# Patient Record
Sex: Female | Born: 1935 | Race: White | Hispanic: No | Smoking: Never smoker
Health system: Southern US, Community
[De-identification: ages and names within clinical notes are randomized; demographics above are authoritative.]

## PROBLEM LIST (undated history)

## (undated) DIAGNOSIS — I10 Essential (primary) hypertension: Secondary | ICD-10-CM

## (undated) DIAGNOSIS — R600 Localized edema: Secondary | ICD-10-CM

## (undated) DIAGNOSIS — E785 Hyperlipidemia, unspecified: Secondary | ICD-10-CM

## (undated) HISTORY — PX: CHOLECYSTECTOMY: SHX55

## (undated) HISTORY — PX: ANKLE FRACTURE SURGERY: SHX122

---

## 1997-12-24 ENCOUNTER — Other Ambulatory Visit: Admission: RE | Admit: 1997-12-24 | Discharge: 1997-12-24 | Payer: Self-pay | Admitting: Internal Medicine

## 2001-01-19 ENCOUNTER — Ambulatory Visit (HOSPITAL_COMMUNITY): Admission: RE | Admit: 2001-01-19 | Discharge: 2001-01-19 | Payer: Self-pay | Admitting: Internal Medicine

## 2001-01-19 ENCOUNTER — Encounter: Payer: Self-pay | Admitting: Internal Medicine

## 2002-03-22 ENCOUNTER — Encounter: Payer: Self-pay | Admitting: Internal Medicine

## 2002-03-22 ENCOUNTER — Encounter: Admission: RE | Admit: 2002-03-22 | Discharge: 2002-03-22 | Payer: Self-pay | Admitting: Internal Medicine

## 2002-04-19 ENCOUNTER — Ambulatory Visit (HOSPITAL_COMMUNITY): Admission: RE | Admit: 2002-04-19 | Discharge: 2002-04-19 | Payer: Self-pay | Admitting: Obstetrics and Gynecology

## 2002-04-19 ENCOUNTER — Encounter (INDEPENDENT_AMBULATORY_CARE_PROVIDER_SITE_OTHER): Payer: Self-pay

## 2002-08-15 ENCOUNTER — Observation Stay (HOSPITAL_COMMUNITY): Admission: RE | Admit: 2002-08-15 | Discharge: 2002-08-16 | Payer: Self-pay | Admitting: Obstetrics and Gynecology

## 2002-08-15 ENCOUNTER — Encounter (INDEPENDENT_AMBULATORY_CARE_PROVIDER_SITE_OTHER): Payer: Self-pay | Admitting: *Deleted

## 2003-04-04 ENCOUNTER — Encounter: Admission: RE | Admit: 2003-04-04 | Discharge: 2003-04-04 | Payer: Self-pay | Admitting: Internal Medicine

## 2003-04-14 ENCOUNTER — Encounter: Admission: RE | Admit: 2003-04-14 | Discharge: 2003-04-14 | Payer: Self-pay | Admitting: Internal Medicine

## 2004-10-25 ENCOUNTER — Encounter: Admission: RE | Admit: 2004-10-25 | Discharge: 2004-10-25 | Payer: Self-pay | Admitting: Internal Medicine

## 2005-12-19 ENCOUNTER — Emergency Department (HOSPITAL_COMMUNITY): Admission: EM | Admit: 2005-12-19 | Discharge: 2005-12-19 | Payer: Self-pay | Admitting: Emergency Medicine

## 2006-05-29 ENCOUNTER — Encounter: Admission: RE | Admit: 2006-05-29 | Discharge: 2006-05-29 | Payer: Self-pay | Admitting: Neurosurgery

## 2006-09-22 ENCOUNTER — Encounter: Admission: RE | Admit: 2006-09-22 | Discharge: 2006-09-22 | Payer: Self-pay | Admitting: Internal Medicine

## 2007-02-16 ENCOUNTER — Inpatient Hospital Stay (HOSPITAL_COMMUNITY): Admission: EM | Admit: 2007-02-16 | Discharge: 2007-02-22 | Payer: Self-pay | Admitting: Emergency Medicine

## 2007-02-22 ENCOUNTER — Ambulatory Visit: Payer: Self-pay | Admitting: Family Medicine

## 2007-02-23 ENCOUNTER — Encounter (INDEPENDENT_AMBULATORY_CARE_PROVIDER_SITE_OTHER): Payer: Self-pay | Admitting: Family Medicine

## 2007-02-23 DIAGNOSIS — E039 Hypothyroidism, unspecified: Secondary | ICD-10-CM

## 2007-02-23 DIAGNOSIS — Z8659 Personal history of other mental and behavioral disorders: Secondary | ICD-10-CM | POA: Insufficient documentation

## 2007-02-23 DIAGNOSIS — S82899A Other fracture of unspecified lower leg, initial encounter for closed fracture: Secondary | ICD-10-CM | POA: Insufficient documentation

## 2007-02-23 DIAGNOSIS — N182 Chronic kidney disease, stage 2 (mild): Secondary | ICD-10-CM | POA: Insufficient documentation

## 2007-02-23 DIAGNOSIS — D519 Vitamin B12 deficiency anemia, unspecified: Secondary | ICD-10-CM

## 2007-02-23 DIAGNOSIS — I499 Cardiac arrhythmia, unspecified: Secondary | ICD-10-CM | POA: Insufficient documentation

## 2007-02-25 ENCOUNTER — Encounter: Payer: Self-pay | Admitting: Family Medicine

## 2007-03-01 ENCOUNTER — Ambulatory Visit: Payer: Self-pay | Admitting: Internal Medicine

## 2007-04-12 ENCOUNTER — Encounter: Payer: Self-pay | Admitting: Family Medicine

## 2007-04-12 ENCOUNTER — Ambulatory Visit: Payer: Self-pay | Admitting: Family Medicine

## 2007-04-18 ENCOUNTER — Encounter: Payer: Self-pay | Admitting: Family Medicine

## 2007-04-18 ENCOUNTER — Ambulatory Visit: Payer: Self-pay | Admitting: Family Medicine

## 2007-04-23 ENCOUNTER — Encounter: Payer: Self-pay | Admitting: Family Medicine

## 2007-04-24 ENCOUNTER — Telehealth: Payer: Self-pay | Admitting: Family Medicine

## 2010-12-07 NOTE — Discharge Summary (Signed)
NAMESAVONNA, [name]                ACCOUNT NO.:  [id number]   MEDICAL RECORD NO.:  [id number]          PATIENT TYPE:  INP   LOCATION:  4736                         FACILITY:  [facility]   PHYSICIAN:  Ollen Gross, M.D.    DATE OF BIRTH:  1923-10-16   DATE OF ADMISSION:  02/16/2007  DATE OF DISCHARGE:  02/22/2007                               DISCHARGE SUMMARY   ADMISSION DIAGNOSES:  1. Displaced bimalleolar comminuted right ankle fracture.  2. Hypertension .  3. Irregular heart beat  4. Hypothyroidism.   DISCHARGE DIAGNOSES:  1. Right bimalleolar ankle fracture dislocation status post open      reduction internal fixation.  2. Uncontrolled hypertension.  3. Postop bradycardia improved.  4. Mild azotemia with increasing creatinine improved.  5. Hypertension.  6. Irregular heart beat.  7. Hypothyroidism.   PROCEDURES:  On February 16, 2007, ORIF right ankle.  Surgeon Dr. Lequita Halt,  assistant Avel Peace PA-C, anesthesia general.   CONSULTATIONS:  [facility].   BRIEF HISTORY:  Kristine [name] is an 77 year old female who fell getting out  of bed going to the bathroom early on the morning of admission, twisted  her right ankle and had immediate onset of pain and deformity.  Presented for open reduction internal fixation.   LABORATORY DATA:  Preop CBC showed hemoglobin of 14.0, hematocrit 40.0,  white cell count 11.6, postop hemoglobin 12.7, last noted H&H 11.5 and  32.8.  Chem panel on admission:  Elevated BUN of 25, elevated creatinine  of 1.33, BUN 3.4.  Remaining chem panel within normal limits.  Serum B-  mets are followed.  BUN did go up to 28, back down to 21.  Creatinine  went up from 1.3 to 1.59 back down to 1.16.  Cardiac enzymes two sets,  first set on February 12, 2007, shows  CK normal at 88, CK MB normal at 3.2,  troponin 0.05.  Second set CK normal 83, CK MB normal at 3.2, troponin  0.07.  TSH level taken once on February 17, 2007, normal at 2.247.  X-rays,  right  ankle, intraoperative C spot films show fixation of median and  lateral malleolar fraction with reduction of tibial talor dislocation.  Chest x-ray February 16, 2007, basilar atelectasis, no focal disease.  EKG  February 16, 2007, sinus bradycardia, left axis deviation, left bundle  branch block, unconfirmed.  On February 19, 2007, normal sinus rhythm, left  axis deviation.  Cannot rule out anterior septal infarct, age  undetermined, abdominal EKG.  Previous EKG.  Old EKG Dec 19, 2005, sinus  bradycardia, left axis deviation, left bundle branch block, confer Dr.  Myrtis Ser.   HOSPITAL COURSE:  The patient was admitted to [facility], taken  to the operating room, underwent above stated procedure without  complications.  Was admitted by Dr. Lequita Halt due to her uncontrolled  hypertension.  We called [facility] who takes care of Dr. Fayrene Fearing Kim's  patients.  She was seen postoperatively by [facility] for hypertension.  Systolic pressure is 160-180.  She normally ran 150 or below.  Cardiac  and blood pressure medications were  adjusted by [facility] hospital  course.  They stopped the Norvasc and put her on Lisinopril.  Also,  checked her TSH level which was normal.  A fair amount of pain from  surgery.  Was doing fairly well on day #1.  A little bit better pain  control, but pressure was up and down.  Unfortunately, on the evening of  day #1, right from midnight, she had an episode of bradycardic spell  with pulse rate down into the 30's.  She was transferred to the unit,  stepdown, by [facility].  Followed very closely.  EKG did not show any  conduction delay.  She had some previous bradycardia on previous  medications felt to be due to multiple heart medications.  Held the  Cardizem.  Her pressure was stable at that point, about 110-150.  She  had a little bit of elevation in her creatinine, so the medications were  switched around.  Creatinine bumped up only a small amount, but was  rechecked and came  back down.  She was placed on telemetry for a short  period of time.  By day #2, she was doing much better.  Pressures were  back up, and she was under good control.  By day #3, pressures went even  higher systolics into the 180's.  Fluids were discontinued and cardiac  medications were changed again to Coreg.  She had a splint placed at the  time of surgery, recommended strict elevation and icing, recommended  keep the splint clean and dry while she is in hospital course.  Therapy  was consulted to assist in gait training, ambulation, ADL's.  She was  strict touch down weightbearing, nonweightbearing to the ankle and foot.  She was improving from a pain standpoint, but it was felt due to the  significant surgery and splint, slow progress, that she would require  some type of skilled nursing facility.  Discharge planning was  consulted.  Got social worker involved to help locate a bed.  By postop  day #4 and #5, her pressures were under better control with the  adjustments of the medications.  She was medically stable.  [facility]  signed off on postop day #4.  She remained in the hospital another  couple of days while they were searching for a bed.  She was seen on  rounds on February 22, 2007.  Felt from an orthopedic standpoint that she  was good to go for transfer.  Discharge planning consultant with Dr.  Corky Downs who was covering for [facility].  They were in  agreement that she was stable.  Medications were placed on the chart.  Recommendations for blood pressure control.  Once some arrangements were  made, a bed became available at [facility].  It was felt the patient  could be transferred at that time.   DISCHARGE PLAN:  1. The patient was discharged to [facility] facility.  2. Discharge diagnoses, please see above.   DISCHARGE MEDICATIONS:  1. Synthroid 25 mcg p.o. daily.  2. Lasix 20 mg p.o. daily.  3. Librium 10 mg p.o. daily.  4. Lovenox 40 mg subcu injection every 24  hours.  She needs to be on      this for a total of six more days after discharge.  5. Coreg 25 mg b.i.d.  6. Norvasc 10 mg p.o. daily.  7. Avapro 150 mg p.o. daily.  8. Hydrochlorothiazide 12.5 mg p.o. daily.  9. Vicodin 5 mg 1-2 q.4-6 h p.r.n. pain.  10.Robaxin 500 mg p.o. q.6-8 h p.r.n. spasms.  11.Laxative of choice.  12.Enema of choice.  13.Tylenol 325 1-2 q.4-6 h p.r.n. mild pain, temper headache.   DISCHARGE INSTRUCTIONS:  1. Diet:  Heart healthy cardiac diet.  2. Activities:  She is strict nonweightbearing.  Touchdown      weightbearing to the right lower extremity.  Please keep the right      foot in splint, elevated on at least two pillows while she is not      up ambulating.  Do not advance weightbearing status.  Please keep      the splint clean and dry. Do not get wet.   FOLLOWUP:  She needs to follow up with Dr. Lequita Halt in the office.  Please contact the office at [phone].  For follow up appointment, she  needs to come in the week of August 11-15.  Please contact the office  for a follow up visit at [phone], to have her be seen over at the  [facility] opposite [facility] with Dr.  Ollen Gross.   DISPOSITION:  [facility].   CONDITION ON DISCHARGE:  Improved.      Alexzandrew L. Perkins, P.A.C.      Ollen Gross, M.D.  Electronically Signed    ALP/MEDQ  D:  02/22/2007  T:  02/22/2007  Job:  [id number]   cc:   Massie Maroon, MD  Ollen Gross, M.D.  Madaline Savage, MD

## 2010-12-07 NOTE — H&P (Signed)
NAMECHARI, [name]                ACCOUNT NO.:  [id number]   MEDICAL RECORD NO.:  [id number]          PATIENT TYPE:  INP   LOCATION:  5028                         FACILITY:  [facility]   PHYSICIAN:  Ollen Gross, M.D.    DATE OF BIRTH:  November 16, 1923   DATE OF ADMISSION:  02/16/2007  DATE OF DISCHARGE:                              HISTORY & PHYSICAL   CHIEF COMPLAINT:  Right ankle pain.   HISTORY OF PRESENT ILLNESS:  The patient is an 77 year old female who  sustained an injury when she got up to go to the bathroom early this  morning, about 4 a.m.  The patient was getting up from the bathroom and  put her hand down on a counter and lost her balance, falling and  twisting her right ankle.  She had the immediate onset of pain and  inability to ambulate.  When she got hold of family, they came in and  gave her assistance, and she was brought to the [facility] Emergency  Department, where x-rays showed bimalleolar, displaced, comminuted ankle  fracture.  Dr. Lequita Halt was on call.  The patient was seen and evaluated,  and admitted for surgical intervention.   ALLERGIES:  No known drug allergies.  Intolerances:  Codeine causes  drowsiness and sleepiness.   CURRENT MEDICATIONS:  Cardizem, Micardis, Inderal, Synthroid, Lasix.   PAST MEDICAL HISTORY:  Hypertension, irregular heart rate,  hypothyroidism.   PAST SURGICAL HISTORY:  Left foot surgery, hysterectomy, varicose vein  surgery.   SOCIAL HISTORY:  She is single, widowed, with one son.  Denies any  tobacco products or alcohol products.   FAMILY HISTORY:  Father deceased in his 51s, with history of prostate  cancer and hypertension.  Mother with questionable heart disease,  deceased around 42.   REVIEW OF SYSTEMS:  General:  No fever, chills, night sweats.  Neurologic:  No seizures, syncope, or paralysis.  Respiratory:  No  shortness of breath, productive cough or hemoptysis.  Cardiovascular:  No chest pain, dyspnea or orthopnea.   Gastrointestinal:  No nausea,  vomiting, diarrhea or constipation.  Genitourinary:  No dysuria or  hematuria, or discharge.  Musculoskeletal:  Right ankle.   PHYSICAL EXAMINATION:  VITAL SIGNS:  Pulse 56, respirations 12, blood  pressure 202/84.  GENERAL:  An 77 year old white female who is well nourished, well  developed, and in mild distress secondary to right ankle pain.  She is  alert, oriented, cooperative, and an average historian.  HEENT:  Normocephalic, atraumatic.  Pupils round and reactive.  Oropharynx clear.  Noted to wear glasses.  Extraocular movements are  intact.  NECK:  Supple.  CHEST:  Clear anterior to posterior.  Chest wall unremarkable.  No rales  or wheezing.  HEART:  Bradycardic rhythm, pulse about 54 to 56.  S1 and S2 are noted.  No rubs, thrills, palpitations, or murmurs.  ABDOMEN:  Soft, slightly round.  Bowel sounds present.  RECTAL/BREAST/GENITALIA:  Not done, not pertinent to present illness.  EXTREMITIES:  Right ankle:  She had sensation that is intact.  There is  an obvious deformity, malalignment deformity  of the right ankle with  some swelling, and also bruising.  Sensation is intact throughout the  foot and toes.   IMPRESSION:  1. Displaced bimalleolar comminuted right ankle fracture.  2. Hypertension.  3. Irregular heart rate.  4. Hypothyroidism.   PLAN:  The patient is admitted to [facility], and will undergo  open reduction internal fixation of the right ankle.  Surgery is to be  performed by Dr. Ollen Gross.  Due to her uncontrolled hypertension,  we will consult medical services [facility] for assistance  postoperatively for her hypertension.      Kristine [name], P.A.C.      Ollen Gross, M.D.  Electronically Signed    ALP/MEDQ  D:  02/16/2007  T:  02/16/2007  Job:  [id number]   cc:   Pearson Grippe, MD

## 2010-12-07 NOTE — Consult Note (Signed)
Kristine [name], [name]                ACCOUNT NO.:  [id number]   MEDICAL RECORD NO.:  [id number]          PATIENT TYPE:  INP   LOCATION:  5028                         FACILITY:  [facility]   PHYSICIAN:  Madaline Savage, MD        DATE OF BIRTH:  May 14, 1924   DATE OF CONSULTATION:  DATE OF DISCHARGE:  02/16/2007                                 CONSULTATION   REASON FOR CONSULTATION:  Management, hypertension.   PRIMARY CARE PHYSICIAN:  Massie Maroon, M.D. with [facility].   HISTORY OF PRESENT ILLNESS:  Kristine [name] is an 77 year old pleasant lady  with a past medical history significant for hypertension and  hyperlipidemia, who was admitted for right ankle fracture.  She had  apparently had a fall when she was going to the bathroom, and she  sustained an injury in her right ankle.  She had an x-ray done of her  right ankle which showed a comminuted fracture with the dislocation of  the right ankle.  She denies any complaints at this point in time.  After admission, she was taken for open reduction/internal fixation by  Dr. Despina Hick, and she had an elevated blood pressure during the surgery.  She was given hydralazine in the OR to bring her blood pressure down.  At this point in time, her blood pressure has come down to a systolic of  158 at this point in time.  She denies any complaints, denies any  headaches, any blurred vision, any chest pain, or abdominal pain at this  point in time.   PAST MEDICAL HISTORY:  1. Hypertension.  2. Hyperlipidemia.   PAST SURGICAL HISTORY:  Cholecystectomy.   ALLERGIES:  She is allergic to CODEINE.   HOME MEDICATIONS:  1. Synthroid 25 mcg daily.  2. Cardizem CD 360 mg daily.  3. Lasix 20 mg daily.  4. Inderal LA 160 mg daily.  5. Chlordiazepoxide 10 mg daily.   SOCIAL HISTORY:  She denies any history of smoking, alcohol, or drug  abuse.   FAMILY HISTORY:  Noncontributory.   REVIEW OF SYSTEMS:  Denies any recent weight loss or weight gain.   No  fevers or chills.  HEENT:  No headaches, no blurred vision, no sore  throat.  CARDIOVASCULAR:  Denies chest pain, palpitations.  RESPIRATORY:  No shortness of breath or cough.  GI:  No abdominal pain, nausea,  vomiting, diarrhea, or constipation.   PHYSICAL EXAMINATION:  She is alert and oriented x3.  VITALS:  Temperature is 97.4, pulse rate 64, respiratory rate 18, blood  pressure 158/75 now.  It was earlier 184/74.  Oxygen saturation 94% on 2  liters.  HEENT:  Normocephalic and atraumatic.  Pupils bilaterally equal and  reactive to light.  Mucous membranes are moist.  NECK:  Supple.  No JVD.  No carotid bruits.  CARDIOVASCULAR:  S1 and S2.  Heart has a regular rate and rhythm.  CHEST:  Clear to auscultation.  ABDOMEN:  Soft.  No bruit heard.  EXTREMITIES:  No edema.   LABS:  White count 11.6, hemoglobin 14, hematocrit 40, platelets  267.  Sodium 138, potassium 4.1, chloride 102, bicarb 27, BUN 25, creatinine  1.33, glucose 108.  INR is 0.9.   IMPRESSION:  1. Hypertensive urgency.  2. Hypothyroidism.   PLAN:  1. Hypertensive urgency:  This is an 77 year old lady who has a      history of hypertension, who is on multiple antihypertensive      medications at home who comes in with a fracture.  On admission,      her blood pressures were in the 170s, and she was taken for      surgery.  At that time, the blood pressure shot up to close to 200.      I suspect her baseline blood pressure is elevated at probably      around 160-170.  At this time, her blood pressure has been down to      158/75.  She has been put back on all of her home medications.  I      will also add the Norvasc to it at this point in time.  I will use      labetalol on a p.r.n. basis.  We will also need good pain control,      as that can contribute to a higher blood pressure.  2. Hypothyroidism:  We will check a TSH on her.  We will continue the      same dose of Synthroid.   Thank you for consulting this  patient.  We will continue to follow this  patient with you.      Madaline Savage, MD  Electronically Signed     PKN/MEDQ  D:  02/16/2007  T:  02/16/2007  Job:  [id number]

## 2010-12-07 NOTE — Op Note (Signed)
Kristine [name], Kristine [name]                ACCOUNT NO.:  [id number]   MEDICAL RECORD NO.:  [id number]          PATIENT TYPE:  INP   LOCATION:  5028                         FACILITY:  [facility]   PHYSICIAN:  Ollen Gross, M.D.    DATE OF BIRTH:  1923/08/07   DATE OF PROCEDURE:  02/16/2007  DATE OF DISCHARGE:                               OPERATIVE REPORT   PREOPERATIVE DIAGNOSIS:  Right bimalleolar ankle fracture dislocation.   POSTOPERATIVE DIAGNOSIS:  Right bimalleolar ankle fracture dislocation.   PROCEDURE:  Open reduction and internal fixation of right ankle.   SURGEON:  Ollen Gross, M.D.   ASSISTANT:  Alexzandrew L. Perkins, P.A.C.   ANESTHESIA:  General.   ESTIMATED BLOOD LOSS:  Minimal.   DRAINS:  None.   TOURNIQUET TIME:  42 minutes at 300 mmHg.   COMPLICATIONS:  None.   CONDITION:  Stable to recovery.   BRIEF CLINICAL NOTE:  [name] is an 77 year old female who was  getting out of bed to go to the bathroom earlier this morning and fell  twisting her right ankle with immediate pain and deformity.  She has  severe bimalleolar ankle fracture dislocation.  She presents now for  open reduction and internal fixation.   PROCEDURE IN DETAIL:  After successful administration of general  anesthetic, the patient's right ankle is reduced and then a tourniquet  is placed high in her right thigh.  The right lower extremity prepped  and draped in the usual sterile fashion.  Extremities were wrapped in  Esmarch and tourniquet inflated to 300 mmHg.  Incision is made over the  lateral malleolus with a 10 blade through the subcutaneous tissue to the  fibula.  There was comminution and I piecemealed the parts together to  keep them in and obtained essentially anatomic reduction.  We used a 7-  hole 1/3 tubular locking plate, configured it to the fibula and then  placed a distal and proximal locking screw and then on fluoro, showed  anatomic reduction.  Then filled with 3 more cortical  screws proximally  and another locking screw more distal.  The medial malleolus had  anatomically reduced also.  I made an incision medially, held the  reduction and then drilled for 2 cancellous screws.  I placed two  parallel 50-mm cancellous crews and then maintained the reduction of the  medial malleolus.  The hardware was found to be in good position on AP  and lateral views with excellent reduction.  We then thoroughly  irrigated both incisions with saline solution and closed subcutaneous  tissues with interrupted 2-0 Vicryl.  Tourniquet was then released, a  total time of 42 minutes.  The skin was closed with interrupted 4-0  nylon.  The incision was cleaned and dried, bulky sterile dressing and  posterior splint were placed.  She was awakened and transported to the  recovery in stable condition.      Ollen Gross, M.D.  Electronically Signed     FA/MEDQ  D:  02/16/2007  T:  02/16/2007  Job:  [id number]

## 2010-12-10 NOTE — H&P (Signed)
   NAMEMEDINA, [name]                          ACCOUNT NO.:  [id number]   MEDICAL RECORD NO.:  [id number]                   PATIENT TYPE:  OBV   LOCATION:  9303                                 FACILITY:  [facility]   PHYSICIAN:  Dineen Kid. Rana Snare, M.D.                 DATE OF BIRTH:  Apr 24, 1924   DATE OF ADMISSION:  08/15/2002  DATE OF DISCHARGE:                                HISTORY & PHYSICAL   HISTORY OF PRESENT ILLNESS:  The patient is a 77 year old gravida 1, para 1  with post menopausal bleeding and some pelvic discomfort which prompted Dr.  Lendell Caprice to perform a CT and ultrasound which showed a 2 cm endometrial  thickening. Otherwise normal appearing uterus.  Because of a stenotic  cervix, the patient went to hysteroscopy and D&C. At hysteroscopy and D&C,  the patient did not tolerate manipulation of the polyp due to severe  bradycardia, so an endometrial biopsy was performed on the polyp which  returned as endometrial hyperplasia. Because of the large endometrial polyp  and hyperplasia, could not be removed under local anesthesia, it was decided  to proceed with laparoscopically-assisted vaginal hysterectomy and she  presents today for this.  Also plan to remove both ovaries and tubes.  She  has also had an extensive cardiac evaluation by Dr. Lendell Caprice and this  reported to be low risk for surgery at this time.   PAST MEDICAL HISTORY:  Significant for hypertension and hypothyroidism.   PAST SURGICAL HISTORY:  Significant for removal of gallbladder in January of  2003.  She also had a D&C at age 9 and most recently in September.   PHYSICAL EXAMINATION:  VITAL SIGNS:  Blood pressure 140/82.  HEART:  Regular rate and rhythm.  LUNGS: Clear to auscultation bilaterally.  ABDOMEN: Soft, nontender, and nondistended.  PELVIC:  Uterus is anteverted, mobile, and nontender.  The cervix is within  normal limits.  Mildly stenotic.   IMPRESSION:  Endometrial hyperplasia with endometrial polyp,  stenotic  cervix, and post menopausal bleeding.   PLAN:  Laparoscopically-assisted vaginal hysterectomy with bilateral  salpingo-oophorectomy. Risks and benefits were discussed at length with the  patient which include, but are not limited to, risk of infection, bleeding,  damage to bowel, bladder, or ureters, risks associated with anesthesia, and  her underlying heart condition and age.  The patient also understands that  there is a risk of blood transfusion and the risks associated with that. She  does give her informed consent.                                               Dineen Kid Rana Snare, M.D.    DCL/MEDQ  D:  08/15/2002  T:  08/15/2002  Job:  [id number]

## 2010-12-10 NOTE — Op Note (Signed)
   [name], Kristine [name]                          ACCOUNT NO.:  [id number]   MEDICAL RECORD NO.:  [id number]                   PATIENT TYPE:  AMB   LOCATION:  SDC                                  FACILITY:  [facility]   PHYSICIAN:  Vikki Ports, M.D.         DATE OF BIRTH:  April 11, 1924   DATE OF PROCEDURE:  04/19/2002  DATE OF DISCHARGE:                                 OPERATIVE REPORT   PREOPERATIVE DIAGNOSES:  Right groin mass.   POSTOPERATIVE DIAGNOSES:  Drainage of right inguinal lymphocele.   ANESTHESIA:  Local MAC.   DESCRIPTION OF PROCEDURE:  After Dr. Rana Snare had completed his hysteroscopy,  patient was then placed in the supine position and the right groin was  prepped and draped in the normal sterile fashion.  A small incision was made  in the inguinal fold, dissected down through subcutaneous fat to a palpable  mass.  I am grasping the mass to firmly mobilize it.  A moderate amount of  clear fluid approximately 20-30 cc drained from the mass.  There was a  complete obliteration of the mass at this time.  No other masses were  palpated.  The tissue looked completely benign.  I felt no further excisions  needed to be performed and the skin was closed with a subcuticular 4-0  Monocryl.  Steri-Strips, sterile dressings were applied.  The patient  tolerated procedure well and went occupational therapy PACU in good  condition.                                               Vikki Ports, M.D.    KRH/MEDQ  D:  04/19/2002  T:  04/19/2002  Job:  [id number]   cc:   Dineen Kid. Rana Snare, M.D.  [address]  [place]  [place] [address]  Fax: [phone]

## 2010-12-10 NOTE — Discharge Summary (Signed)
Kristine [name], SALBERG                          ACCOUNT NO.:  [id number]   MEDICAL RECORD NO.:  [id number]                   PATIENT TYPE:  OBV   LOCATION:  9303                                 [facility]ILITY:  [facility]   PHYSICIAN:  Dineen Kid. Rana Snare, M.D.                 DATE OF BIRTH:  01-27-1924   DATE OF ADMISSION:  08/15/2002  DATE OF DISCHARGE:  08/16/2002                                 DISCHARGE SUMMARY   HISTORY OF PRESENT ILLNESS:  The patient is a 77 year old, G1, P1 with  pelvic discomfort that prompted Dr. Lendell Caprice to perform a CT scan and  ultrasound showing a 2 cm endometrial thickness.  Because of the stenotic  cervix, the patient underwent a hysteroscopy and D&C.  At the time of D&C,  the patient tolerated the manipulation of the polyp due to severe  bradycardia.  Several endometrial biopsies were performed on the polyp which  returned as endometrial hyperplasia.  Because of the large polyp and  hyperplasia and it could not be removed under local anesthesia, it was  decided to proceed with laparoscopic assisted vaginal hysterectomy.  She  presented to the hospital for this.  She has also had extensive cardiac  evaluation by Dr. Lendell Caprice and it was reported to be low risk at the time of  surgery.   HOSPITAL COURSE:  The patient was admitted to same day surgery.  She  underwent a laparoscopic assisted vaginal hysterectomy with bilateral  salingo-oophorectomy.  The surgery was uncomplicated with estimated blood  loss during the procedure 50 cc.  The findings at the time of the surgery  were normal appearing uterus, tubes and ovaries consistent with a  postmenopausal state.  The uterus was opened after the procedure and large  endometrial polyp was noted and was sent to pathology.  At the time of  discharge, the pathology report has not returned.  The patient's  postoperative care was unremarkable.  By postop day #1, she was ambulating  without difficulty and tolerating a regular  diet.  She had normoactive bowel  sounds.  Her postoperative hemoglobin was 11.5 and she remained afebrile  throughout her hospital course.  On postop day #1, she was discharged home.   FOLLOW UP:  Follow up in the office in two weeks.   DISCHARGE MEDICATIONS:  1. Darvocet #30 to take as needed.  2. Vioxx 25 mg, #30, take one a take for tendonitis symptoms.   SPECIAL INSTRUCTIONS:  The patient was told to return for increased fever,  pain or bleeding.                                               Dineen Kid Rana Snare, M.D.    DCL/MEDQ  D:  08/16/2002  T:  08/17/2002  Job:  [id number]   cc:   Janae Bridgeman. Eloise Harman., M.D.  [address]  [place]  [place] [address]  Fax: [phone]

## 2010-12-10 NOTE — Op Note (Signed)
NAMEELANIE, HAMMITT                          ACCOUNT NO.:  [id number]   MEDICAL RECORD NO.:  [id number]                   PATIENT TYPE:  AMB   LOCATION:  SDC                                  [facility]   PHYSICIAN:  Dineen Kid. Rana Snare, M.D.                 DATE OF BIRTH:  1924-06-07   DATE OF PROCEDURE:  04/19/2002  DATE OF DISCHARGE:                                 OPERATIVE REPORT   PREOPERATIVE DIAGNOSES:  1. Cervical stenosis.  2. Endometrial thickness on ultrasound.   POSTOPERATIVE DIAGNOSES:  1. Cervical stenosis.  2. Endometrial thickness on ultrasound.  3. Endometrial polyp.  Frozen pathology shows hyperplasia.   PROCEDURE:  1. Hysteroscopy.  2. Dilatation and curettage with biopsy of the endometrial polyp.   SURGEON:  Dineen Kid. Rana Snare, M.D.   ANESTHESIA:  Monitored anesthetic care and paracervical block.   INDICATIONS:  The patient is a 77 year old who was referred for ultrasound  showing an increasing endometrial thickness.  She had cervical stenosis.  She presents for hysteroscopy, D&C.  Risks and benefits were discussed at  length.  Informed consent was obtained.   DESCRIPTION OF PROCEDURE:  After adequate analgesia the patient was  sterilely prepped and draped.  Bladder was sterilely drained.  A Graves'  speculum was placed.  Tenaculum was placed on the anterior lip of the  cervix.  A paracervical block was placed with 1% Xylocaine with 1:100,000  epinephrine.  The uterus was sounded to 8 cm, easily dilated to a number 27  Hegar dilator.  The hysteroscope was inserted and the findings were a very  large endometrial polyp, unable to see the endometrial lining very clearly  around the endometrial polyp due to its size and the small atrophic nature  of the uterus.  Attempts at removing the polyp with polyp forceps were  minimally successful with only fragments of the polyp being able to be  removed and due to concern of the thin endometrial wall and the  consideration for perforation, the difficulty of getting around the  endometrial polyp, a resectoscope was not used.  Fragments of the  endometrial polyp were sent for frozen pathology which showed simple  hyperplasia.  Because of the diagnosis and large endometrial polyp and not  wanting to put the patient at increased risk for perforation at this time,  we elected to discontinue the [facility] and proceed with Dr. Wyonia Hough right  inguinal exploration.  Estimated blood loss at the time was 10 cc.  The  sorbitol deficit was 40 cc.  The findings were a very large endometrial  polyp with frozen biopsy showing simple hyperplasia.  At this time the  speculum is removed.  The tenaculum is removed from the anterior lip of the  cervix.  The cervix was noted to be hemostatic.  The patient was transferred  in stable condition to the recovery room.  Sponge, needle,  and instrument  count was normal x3.    DISPOSITION:  The patient will be discharged and will follow up in the  office in two to three weeks.  She is sent home with a routine instruction  sheet for D&C and told to follow up with ibuprofen as needed for pain.                                               Dineen Kid Rana Snare, M.D.    DCL/MEDQ  D:  04/19/2002  T:  04/19/2002  Job:  [id number]

## 2010-12-10 NOTE — Op Note (Signed)
Kristine [name], Kristine [name]                          ACCOUNT NO.:  [id number]   MEDICAL RECORD NO.:  [id number]                   PATIENT TYPE:  OBV   LOCATION:  9303                                 [facility]ILITY:  [facility]   PHYSICIAN:  Dineen Kid. Rana Snare, M.D.                 DATE OF BIRTH:  [birth year]   DATE OF PROCEDURE:  08/15/2002  DATE OF DISCHARGE:                                 OPERATIVE REPORT   PREOPERATIVE DIAGNOSES:  1. Endometrial hyperplasia.  2. Endometrial mass.  3. Stenotic cervix.  4. Postmenopausal bleeding.   POSTOPERATIVE DIAGNOSES:  1. Endometrial hyperplasia.  2. Endometrial mass.  3. Stenotic cervix.  4. Postmenopausal bleeding.   PROCEDURE:  Laparoscopically-assisted vaginal hysterectomy and bilateral  salpingo-oophorectomy.   SURGEON:  Dineen Kid. Rana Snare, M.D.   ASSISTANT:  Tracie Harrier, M.D.   ANESTHESIA:  General endotracheal.   INDICATIONS:  The patient is a 77 year old G1, P1, with postmenopausal  bleeding and an ultrasound shows endometrial mass.  Hysteroscopy D&C  confirms endometrial hyperplasia.  I am unable to remove the polyp due to  severe bradycardia during surgery.  She presents today for a  laparoscopically-assisted vaginal hysterectomy with bilateral salpingo-  oophorectomy.  Risks and benefits were discussed at length.  Informed  consent was obtained.  See history and physical for further details.   FINDINGS AT SURGERY:  Grossly normal-appearing ovaries, uterus, and liver.  The appendix was not seen due to its retrocecal position.  After the  procedure, the uterus was opened and a large endometrial polyp was noted.  No obvious evidence of invasive disease.   DESCRIPTION OF PROCEDURE:  After adequate analgesia, the patient was placed  in the dorsal lithotomy position, and she was sterilely prepped and draped.  The bladder was sterilely drained.  A Hulka tenaculum was placed on the  cervix.  A 1 cm infraumbilical skin incision was made.  A Veress  needle was  inserted.  The abdomen was insufflated with dullness to percussion, and an  11 mm trocar was inserted.  A 5 mm trocar was inserted to the of midline two  fingerbreadths above the pubic symphysis under direct visualization, and the  above findings were noted.  A Gyrus tripolar ligator was used to ligate and  cut down across the infundibulopelvic ligaments bilaterally, down across the  round ligaments.  This was done bilaterally with good hemostasis and care  taken to avoid the underlying vessels and stay clear of the ureter.  At this  point the bladder reflection was elevated and it was reflected off the  anterior portion of the uterus.  The abdomen was then desufflated, the legs  were elevated, and a Jacobs tenaculum was placed on the posterior lip of the  cervix.  A posterior colpotomy was performed and the cervix was  circumscribed with Bovie cautery and a LigaSure ligator was used to ligate  across  the uterosacral ligaments bilaterally, up across the cardinal  ligaments, the inferior portion of the uterus up to the uterine vessels, and  up successive bites up to the round ligament with good hemostasis achieved  at all levels.  At this point the uterus was easily removed with the tubes  and ovaries intact.  The uterosacral ligaments were identified, ligated with  0 Monocryl suture.  The posterior peritoneum was then closed in a  pursestring fashion, the vagina was then closed with figure-of-eights in a  vertical fashion with good approximation and good hemostasis.  A Foley  catheter was then placed with return of good, clear yellow urine.  At this  point the abdomen was reinsufflated and a small arterial bleed was noted on  the left mid- to the left uterine artery, which was coagulated with the  tripolar.  Peritoneal edges were then cauterized with the tripolar, and  after copious amount of irrigation, adequate hemostasis was assured, the  abdomen was then desufflated, the  infraumbilical skin incision was closed  with a 0 Vicryl on the fascia and 3-0 Vicryl Rapide subcuticular stitch, and  the 5 mm site was closed with a 3-0 Vicryl subcuticular stitch.  The patient  tolerated the procedure well and was stable on transfer to the recovery  room.  The sponge, needle, and instrument count was normal x3.  The patient  received 1 g of Mefoxin preoperatively.  Estimated blood loss less than 50  mL.                                               Dineen Kid Rana Snare, M.D.    DCL/MEDQ  D:  08/15/2002  T:  08/15/2002  Job:  [id number]

## 2010-12-10 NOTE — H&P (Signed)
   Kristine [name], Kristine [name]                          ACCOUNT NO.:  [id number]   MEDICAL RECORD NO.:  [id number]                   PATIENT TYPE:  AMB   LOCATION:  SDC                                  [facility]   PHYSICIAN:  Dineen Kid. Rana Snare, M.D.                 DATE OF BIRTH:  August 30, 1923   DATE OF ADMISSION:  04/19/2002  DATE OF DISCHARGE:                                HISTORY & PHYSICAL   HISTORY OF PRESENT ILLNESS:  The patient is a 77 year old patient referred  by Dr. Dimas Alexandria for evaluation of pelvic pain and a CAT scan  performed with an ultrasound at [facility] on March 22, 2002.  Ultrasound  showed a normal appearing uterus, however, markedly thickened endometrium  which is measuring 2 cm in thickness.  The ovaries are otherwise normal.  The patient has reported off and on pelvic discomfort for the last number of  years.  She also has a lymph node in the right inguinal area which Dr.  Lovie Chol is planning to surgically biopsy.  She denies any vaginal  bleeding.  She did have a D&C at the age of the 51.  She went through  menopause naturally at age 66 and has not had any bleeding or pain since  then.   PAST MEDICAL HISTORY:  1. High blood pressure.  2. Thyroid problems.   MEDICATIONS:  1. Inderal.  2. Diltiazem.  3. Furosemide.  4. Synthroid.   ALLERGIES:  CODEINE.   PAST SURGICAL HISTORY:  1. Gallbladder removal.  2. D&C at age 67.   PHYSICAL EXAMINATION:  VITAL SIGNS:  Blood pressure 140/82.  HEART:  Regular rate and rhythm.  LUNGS:  Clear to auscultation bilaterally.  ABDOMEN:  Nondistended.  PELVIC:  Atrophic external genitalia.  Normal Bartholin. Skene and urethra.  Uterus is antevert and mobile.  Nontender, no adnexal masses are palpable.   IMPRESSION:  1. Cervical stenosis.  2. Thickened endometrium.   PLAN:  1. Hysteroscopy.  2. D&C.  3. Surgery to be performed by Dr. Lovie Chol in combined procedure     for removal of the  inguinal lymph     node on the right side.  The risks and benefits were discussed with the     patient at length which include, but not limited to risk of infection,     bleeding, damage to the uterus, tubes, ovaries, bowel or bladder and need     for future surgeries.                                               Dineen Kid Rana Snare, M.D.    DCL/MEDQ  D:  04/19/2002  T:  04/19/2002  Job:  [id number]

## 2011-01-17 NOTE — Miscellaneous (Signed)
Summary: Orders Update-lasix  Clinical Lists Changes  Medications: Added new medication of LASIX 20 MG TABS (FUROSEMIDE) One daily - Signed Rx of LASIX 20 MG TABS (FUROSEMIDE) One daily;  #100 x 3;  Signed;  Entered by: Luretha Murphy NP;  Authorized by: Luretha Murphy NP;  Method used: Electronic    Prescriptions: LASIX 20 MG TABS (FUROSEMIDE) One daily  #100 x 3   Entered and Authorized by:   Luretha Murphy NP   Signed by:   Luretha Murphy NP on 04/23/2007   Method used:   Electronically sent to ...       [facility]  [address]       [address]       [place], [place]  [address]       Ph: [phone]       Fax: [phone]   RxID:   [id number]

## 2011-05-09 LAB — CBC
HCT: 36.5
HCT: 40
Hemoglobin: 12.7
Hemoglobin: 14
MCHC: 34.7
MCHC: 35
MCV: 100
MCV: 103.3 — ABNORMAL HIGH
Platelets: 216
RBC: 4.09
RDW: 13.2
WBC: 10.1
WBC: 11.6 — ABNORMAL HIGH

## 2011-05-09 LAB — BASIC METABOLIC PANEL
CO2: 27
CO2: 27
CO2: 27
Calcium: 8.2 — ABNORMAL LOW
Calcium: 8.6
Chloride: 103
Chloride: 105
Creatinine, Ser: 1.08
Creatinine, Ser: 1.16
GFR calc Af Amer: 38 — ABNORMAL LOW
GFR calc Af Amer: 54 — ABNORMAL LOW
GFR calc Af Amer: 59 — ABNORMAL LOW
GFR calc non Af Amer: 40 — ABNORMAL LOW
GFR calc non Af Amer: 45 — ABNORMAL LOW
Glucose, Bld: 147 — ABNORMAL HIGH
Glucose, Bld: 192 — ABNORMAL HIGH
Potassium: 3.7
Potassium: 3.9
Potassium: 4.7
Sodium: 138
Sodium: 139

## 2011-05-09 LAB — DIFFERENTIAL
Basophils Absolute: 0
Eosinophils Absolute: 0
Eosinophils Absolute: 0.3
Eosinophils Relative: 0
Eosinophils Relative: 1
Eosinophils Relative: 2
Lymphocytes Relative: 11 — ABNORMAL LOW
Lymphs Abs: 1
Lymphs Abs: 1.1
Lymphs Abs: 1.2
Neutro Abs: 7.7
Neutrophils Relative %: 76
Neutrophils Relative %: 77
Neutrophils Relative %: 89 — ABNORMAL HIGH

## 2011-05-09 LAB — CARDIAC PANEL(CRET KIN+CKTOT+MB+TROPI)
Total CK: 88
Troponin I: 0.05

## 2011-05-09 LAB — COMPREHENSIVE METABOLIC PANEL
Alkaline Phosphatase: 86
Chloride: 102
Creatinine, Ser: 1.33 — ABNORMAL HIGH
Glucose, Bld: 108 — ABNORMAL HIGH
Potassium: 4.1
Sodium: 138
Total Bilirubin: 0.9

## 2014-03-15 ENCOUNTER — Emergency Department (HOSPITAL_COMMUNITY): Payer: Medicare Other

## 2014-03-15 ENCOUNTER — Encounter (HOSPITAL_COMMUNITY): Payer: Self-pay | Admitting: Emergency Medicine

## 2014-03-15 ENCOUNTER — Inpatient Hospital Stay (HOSPITAL_COMMUNITY)
Admission: EM | Admit: 2014-03-15 | Discharge: 2014-03-18 | DRG: 759 | Disposition: A | Discharge status: Skilled Nursing Facility | Payer: Medicare Other | Attending: Family Medicine | Admitting: Family Medicine

## 2014-03-15 DIAGNOSIS — R5381 Other malaise: Secondary | ICD-10-CM | POA: Diagnosis not present

## 2014-03-15 DIAGNOSIS — N390 Urinary tract infection, site not specified: Secondary | ICD-10-CM | POA: Diagnosis present

## 2014-03-15 DIAGNOSIS — S82899A Other fracture of unspecified lower leg, initial encounter for closed fracture: Secondary | ICD-10-CM

## 2014-03-15 DIAGNOSIS — D62 Acute posthemorrhagic anemia: Secondary | ICD-10-CM

## 2014-03-15 DIAGNOSIS — Z888 Allergy status to other drugs, medicaments and biological substances status: Secondary | ICD-10-CM

## 2014-03-15 DIAGNOSIS — I129 Hypertensive chronic kidney disease with stage 1 through stage 4 chronic kidney disease, or unspecified chronic kidney disease: Secondary | ICD-10-CM | POA: Diagnosis present

## 2014-03-15 DIAGNOSIS — F411 Generalized anxiety disorder: Secondary | ICD-10-CM

## 2014-03-15 DIAGNOSIS — B3749 Other urogenital candidiasis: Principal | ICD-10-CM | POA: Diagnosis present

## 2014-03-15 DIAGNOSIS — E785 Hyperlipidemia, unspecified: Secondary | ICD-10-CM | POA: Diagnosis present

## 2014-03-15 DIAGNOSIS — I10 Essential (primary) hypertension: Secondary | ICD-10-CM

## 2014-03-15 DIAGNOSIS — Z9181 History of falling: Secondary | ICD-10-CM

## 2014-03-15 DIAGNOSIS — Z885 Allergy status to narcotic agent status: Secondary | ICD-10-CM

## 2014-03-15 DIAGNOSIS — N182 Chronic kidney disease, stage 2 (mild): Secondary | ICD-10-CM | POA: Diagnosis present

## 2014-03-15 DIAGNOSIS — R296 Repeated falls: Secondary | ICD-10-CM

## 2014-03-15 DIAGNOSIS — E039 Hypothyroidism, unspecified: Secondary | ICD-10-CM | POA: Diagnosis present

## 2014-03-15 DIAGNOSIS — R5383 Other fatigue: Secondary | ICD-10-CM | POA: Diagnosis not present

## 2014-03-15 DIAGNOSIS — N183 Chronic kidney disease, stage 3 unspecified: Secondary | ICD-10-CM | POA: Diagnosis present

## 2014-03-15 DIAGNOSIS — Z881 Allergy status to other antibiotic agents status: Secondary | ICD-10-CM

## 2014-03-15 DIAGNOSIS — Z79899 Other long term (current) drug therapy: Secondary | ICD-10-CM

## 2014-03-15 DIAGNOSIS — I499 Cardiac arrhythmia, unspecified: Secondary | ICD-10-CM

## 2014-03-15 HISTORY — DX: Localized edema: R60.0

## 2014-03-15 HISTORY — DX: Essential (primary) hypertension: I10

## 2014-03-15 HISTORY — DX: Hyperlipidemia, unspecified: E78.5

## 2014-03-15 LAB — CBC WITH DIFFERENTIAL/PLATELET
BASOS ABS: 0 10*3/uL (ref 0.0–0.1)
BASOS PCT: 0 % (ref 0–1)
Eosinophils Absolute: 0.1 10*3/uL (ref 0.0–0.7)
Eosinophils Relative: 1 % (ref 0–5)
HEMATOCRIT: 39.4 % (ref 36.0–46.0)
Hemoglobin: 13.4 g/dL (ref 12.0–15.0)
Lymphocytes Relative: 12 % (ref 12–46)
Lymphs Abs: 1.2 10*3/uL (ref 0.7–4.0)
MCH: 34.6 pg — ABNORMAL HIGH (ref 26.0–34.0)
MCHC: 34 g/dL (ref 30.0–36.0)
MCV: 101.8 fL — ABNORMAL HIGH (ref 78.0–100.0)
MONO ABS: 1.5 10*3/uL — AB (ref 0.1–1.0)
Monocytes Relative: 14 % — ABNORMAL HIGH (ref 3–12)
NEUTROS ABS: 7.7 10*3/uL (ref 1.7–7.7)
Neutrophils Relative %: 73 % (ref 43–77)
Platelets: 186 10*3/uL (ref 150–400)
RBC: 3.87 MIL/uL (ref 3.87–5.11)
RDW: 13.7 % (ref 11.5–15.5)
WBC: 10.5 10*3/uL (ref 4.0–10.5)

## 2014-03-15 LAB — URINALYSIS, ROUTINE W REFLEX MICROSCOPIC
GLUCOSE, UA: NEGATIVE mg/dL
KETONES UR: NEGATIVE mg/dL
Nitrite: NEGATIVE
PH: 5.5 (ref 5.0–8.0)
Protein, ur: 30 mg/dL — AB
Specific Gravity, Urine: 1.025 (ref 1.005–1.030)
Urobilinogen, UA: 1 mg/dL (ref 0.0–1.0)

## 2014-03-15 LAB — COMPREHENSIVE METABOLIC PANEL
ALBUMIN: 3.6 g/dL (ref 3.5–5.2)
ALT: 10 U/L (ref 0–35)
AST: 20 U/L (ref 0–37)
Alkaline Phosphatase: 76 U/L (ref 39–117)
Anion gap: 17 — ABNORMAL HIGH (ref 5–15)
BUN: 30 mg/dL — ABNORMAL HIGH (ref 6–23)
CALCIUM: 9.7 mg/dL (ref 8.4–10.5)
CHLORIDE: 101 meq/L (ref 96–112)
CO2: 21 mEq/L (ref 19–32)
Creatinine, Ser: 1.47 mg/dL — ABNORMAL HIGH (ref 0.50–1.10)
GFR calc Af Amer: 36 mL/min — ABNORMAL LOW (ref >90)
GFR calc non Af Amer: 31 mL/min — ABNORMAL LOW (ref >90)
Glucose, Bld: 113 mg/dL — ABNORMAL HIGH (ref 70–99)
Potassium: 4.5 mEq/L (ref 3.7–5.3)
SODIUM: 139 meq/L (ref 137–147)
Total Bilirubin: 1 mg/dL (ref 0.3–1.2)
Total Protein: 7.1 g/dL (ref 6.0–8.3)

## 2014-03-15 LAB — URINE MICROSCOPIC-ADD ON

## 2014-03-15 LAB — TROPONIN I: Troponin I: <0.3 ng/mL (ref <0.30)

## 2014-03-15 LAB — CK: Total CK: 207 U/L — ABNORMAL HIGH (ref 7–177)

## 2014-03-15 LAB — PRO B NATRIURETIC PEPTIDE: PRO B NATRI PEPTIDE: 1053 pg/mL — AB (ref 0–450)

## 2014-03-15 LAB — TSH: TSH: 5.1 u[IU]/mL — ABNORMAL HIGH (ref 0.350–4.500)

## 2014-03-15 MED ORDER — FUROSEMIDE 20 MG PO TABS
20.0000 mg | ORAL_TABLET | Freq: Every day | ORAL | Status: DC
  Administered 2014-03-16 – 2014-03-18 (×3): 20 mg via ORAL
  Filled 2014-03-15 (×3): qty 1

## 2014-03-15 MED ORDER — HEPARIN SODIUM (PORCINE) 5000 UNIT/ML IJ SOLN
5000.0000 [IU] | Freq: Three times a day (TID) | INTRAMUSCULAR | Status: DC
  Administered 2014-03-15 – 2014-03-18 (×9): 5000 [IU] via SUBCUTANEOUS
  Filled 2014-03-15 (×11): qty 1

## 2014-03-15 MED ORDER — DEXTROSE 5 % IV SOLN
1.0000 g | INTRAVENOUS | Status: DC
  Filled 2014-03-15: qty 10

## 2014-03-15 MED ORDER — CARVEDILOL 25 MG PO TABS
25.0000 mg | ORAL_TABLET | Freq: Two times a day (BID) | ORAL | Status: DC
  Administered 2014-03-16 – 2014-03-18 (×5): 25 mg via ORAL
  Filled 2014-03-15 (×8): qty 1

## 2014-03-15 MED ORDER — TRAMADOL HCL 50 MG PO TABS
50.0000 mg | ORAL_TABLET | Freq: Two times a day (BID) | ORAL | Status: DC | PRN
  Administered 2014-03-15 – 2014-03-18 (×4): 50 mg via ORAL
  Filled 2014-03-15 (×4): qty 1

## 2014-03-15 MED ORDER — LEVOTHYROXINE SODIUM 88 MCG PO TABS
88.0000 ug | ORAL_TABLET | Freq: Every day | ORAL | Status: DC
  Administered 2014-03-17 – 2014-03-18 (×2): 88 ug via ORAL
  Filled 2014-03-15 (×5): qty 1

## 2014-03-15 MED ORDER — GABAPENTIN 300 MG PO CAPS
300.0000 mg | ORAL_CAPSULE | Freq: Every day | ORAL | Status: DC
Start: 2014-03-15 — End: 2014-03-18
  Administered 2014-03-15 – 2014-03-18 (×4): 300 mg via ORAL
  Filled 2014-03-15 (×4): qty 1

## 2014-03-15 MED ORDER — ATORVASTATIN CALCIUM 10 MG PO TABS
10.0000 mg | ORAL_TABLET | Freq: Every day | ORAL | Status: DC
  Administered 2014-03-16 – 2014-03-17 (×2): 10 mg via ORAL
  Filled 2014-03-15 (×3): qty 1

## 2014-03-15 MED ORDER — VITAMIN B-12 1000 MCG PO TABS
1000.0000 ug | ORAL_TABLET | Freq: Every day | ORAL | Status: DC
  Administered 2014-03-16 – 2014-03-18 (×3): 1000 ug via ORAL
  Filled 2014-03-15 (×3): qty 1

## 2014-03-15 MED ORDER — ACETAMINOPHEN 325 MG PO TABS
650.0000 mg | ORAL_TABLET | Freq: Once | ORAL | Status: AC
  Administered 2014-03-15: 650 mg via ORAL
  Filled 2014-03-15: qty 2

## 2014-03-15 MED ORDER — SODIUM CHLORIDE 0.9 % IV SOLN
INTRAVENOUS | Status: DC
  Administered 2014-03-15: 20:00:00 via INTRAVENOUS

## 2014-03-15 MED ORDER — DEXTROSE 5 % IV SOLN
1.0000 g | Freq: Once | INTRAVENOUS | Status: AC
  Administered 2014-03-15: 1 g via INTRAVENOUS
  Filled 2014-03-15: qty 10

## 2014-03-15 MED ORDER — VITAMIN D3 25 MCG (1000 UNIT) PO TABS
1000.0000 [IU] | ORAL_TABLET | Freq: Every day | ORAL | Status: DC
  Administered 2014-03-16 – 2014-03-18 (×3): 1000 [IU] via ORAL
  Filled 2014-03-15 (×3): qty 1

## 2014-03-15 MED ORDER — LEVOTHYROXINE SODIUM 100 MCG PO TABS
100.0000 ug | ORAL_TABLET | Freq: Every day | ORAL | Status: DC
  Administered 2014-03-16 – 2014-03-18 (×3): 100 ug via ORAL
  Filled 2014-03-15 (×5): qty 1

## 2014-03-15 MED ORDER — AMLODIPINE BESYLATE 5 MG PO TABS
5.0000 mg | ORAL_TABLET | Freq: Every day | ORAL | Status: DC
  Administered 2014-03-15 – 2014-03-16 (×2): 5 mg via ORAL
  Filled 2014-03-15 (×2): qty 1

## 2014-03-15 MED ORDER — ACETAMINOPHEN 500 MG PO TABS
500.0000 mg | ORAL_TABLET | Freq: Four times a day (QID) | ORAL | Status: DC | PRN
  Administered 2014-03-16 – 2014-03-18 (×5): 500 mg via ORAL
  Filled 2014-03-15 (×5): qty 1

## 2014-03-15 NOTE — ED Notes (Signed)
Bed: [id number] Expected date: 03/15/14 Expected time: 11:16 AM Means of arrival: Ambulance Comments: fall

## 2014-03-15 NOTE — ED Notes (Signed)
Patient transported to X-ray 

## 2014-03-15 NOTE — ED Provider Notes (Signed)
CSN: [id number]     Arrival date & time 03/15/14  1112 History   First MD Initiated Contact with Patient 03/15/14 1127     Chief Complaint  Patient presents with  . Fall     (Consider location/radiation/quality/duration/timing/severity/associated sxs/prior Treatment) Patient is a 80 y.o. female presenting with fall. The history is provided by the patient.  Fall This is a recurrent problem. Episode onset: 2-3 days ago. The problem occurs daily. The problem has not changed since onset.Pertinent negatives include no chest pain, no abdominal pain, no headaches and no shortness of breath. Nothing aggravates the symptoms. Nothing relieves the symptoms. She has tried nothing for the symptoms. The treatment provided no relief.    Past Medical History  Diagnosis Date  . Hypertension   . Hyperlipidemia   . Bilateral leg edema    Past Surgical History  Procedure Laterality Date  . Ankle fracture surgery    . Cholecystectomy     History reviewed. No pertinent family history. History  Substance Use Topics  . Smoking status: Never Smoker   . Smokeless tobacco: Not on file  . Alcohol Use: No   OB History   Grav Para Term Preterm Abortions TAB SAB Ect Mult Living                 Review of Systems  Constitutional: Negative for fever and fatigue.  HENT: Negative for congestion and drooling.   Eyes: Negative for pain.  Respiratory: Negative for cough and shortness of breath.   Cardiovascular: Negative for chest pain.  Gastrointestinal: Negative for nausea, vomiting, abdominal pain and diarrhea.  Genitourinary: Negative for dysuria and hematuria.  Musculoskeletal: Negative for back pain, gait problem and neck pain.       Edema  Skin: Negative for color change.  Neurological: Negative for dizziness and headaches.       Falls   Hematological: Negative for adenopathy.  Psychiatric/Behavioral: Negative for behavioral problems.  All other systems reviewed and are  negative.     Allergies  Amitriptyline; Buspar; Codeine; Dyazide; and Macrolides and ketolides  Home Medications   Prior to Admission medications   Medication Sig Start Date End Date Taking? Authorizing Provider  acetaminophen (TYLENOL) 500 MG tablet Take 500 mg by mouth every 6 (six) hours as needed for mild pain.   Yes Historical Provider, MD  amLODipine (NORVASC) 5 MG tablet Take 5 mg by mouth daily.   Yes Historical Provider, MD  carvedilol (COREG) 25 MG tablet Take 25 mg by mouth 2 (two) times daily with a meal.   Yes Historical Provider, MD  cholecalciferol (VITAMIN D) 1000 UNITS tablet Take 1,000 Units by mouth daily.   Yes Historical Provider, MD  furosemide (LASIX) 20 MG tablet Take 20 mg by mouth daily as needed.   Yes Historical Provider, MD  gabapentin (NEURONTIN) 300 MG capsule Take 300 mg by mouth daily.   Yes Historical Provider, MD  irbesartan (AVAPRO) 300 MG tablet Take 300 mg by mouth daily.   Yes Historical Provider, MD  levothyroxine (SYNTHROID, LEVOTHROID) 100 MCG tablet Take 100 mcg by mouth daily before breakfast.   Yes Historical Provider, MD  levothyroxine (SYNTHROID, LEVOTHROID) 88 MCG tablet Take 88 mcg by mouth daily before breakfast. With 100  Mcg + 188 mcg   Yes Historical Provider, MD  nystatin cream (MYCOSTATIN) Apply 1 application topically 2 (two) times daily.   Yes Historical Provider, MD  rosuvastatin (CRESTOR) 5 MG tablet Take 5 mg by mouth daily.  Yes Historical Provider, MD  traMADol (ULTRAM) 50 MG tablet Take 50 mg by mouth every 12 (twelve) hours as needed for moderate pain.   Yes Historical Provider, MD  vitamin B-12 (CYANOCOBALAMIN) 1000 MCG tablet Take 1,000 mcg by mouth daily.   Yes Historical Provider, MD   BP 160/82  Pulse 78  Temp(Src) 97.4 F (36.3 C) (Oral)  Resp 16  Ht 5\' 5"  (1.651 m)  Wt 205 lb 14.6 oz (93.4 kg)  BMI 34.27 kg/m2  SpO2 93% Physical Exam  Nursing note and vitals reviewed. Constitutional: She is oriented to  person, place, and time. She appears well-developed and well-nourished.  HENT:  Head: Normocephalic and atraumatic.  Mouth/Throat: Oropharynx is clear and moist. No oropharyngeal exudate.  Eyes: Conjunctivae and EOM are normal. Pupils are equal, round, and reactive to light.  Neck: Normal range of motion. Neck supple.  No cervical tenderness to palpation.  Cardiovascular: Normal rate, regular rhythm, normal heart sounds and intact distal pulses.  Exam reveals no gallop and no friction rub.   No murmur heard. Pulmonary/Chest: Effort normal and breath sounds normal. No respiratory distress. She has no wheezes. She exhibits no tenderness.  Abdominal: Soft. Bowel sounds are normal. There is no tenderness. There is no rebound and no guarding.  Genitourinary:  Mild maceration of the labia majora and the perineal area.  Musculoskeletal: Normal range of motion. She exhibits edema (moderate to severe chronic appearing edema in the bilateral lower extremities.). She exhibits no tenderness.  Mild left paraspinal thoracic tenderness to palpation. Mild mid thoracic, lower thoracic, and upper lumbar tenderness to palpation.  No focal tenderness to palpation of the hips bilaterally.  Neurological: She is alert and oriented to person, place, and time.  Skin: Skin is warm and dry.  Psychiatric: She has a normal mood and affect. Her behavior is normal.    ED Course  Procedures (including critical care time) Labs Review Labs Reviewed  CBC WITH DIFFERENTIAL - Abnormal; Notable for the following:    MCV 101.8 (*)    MCH 34.6 (*)    Monocytes Relative 14 (*)    Monocytes Absolute 1.5 (*)    All other components within normal limits  COMPREHENSIVE METABOLIC PANEL - Abnormal; Notable for the following:    Glucose, Bld 113 (*)    BUN 30 (*)    Creatinine, Ser 1.47 (*)    GFR calc non Af Amer 31 (*)    GFR calc Af Amer 36 (*)    Anion gap 17 (*)    All other components within normal limits  PRO B  NATRIURETIC PEPTIDE - Abnormal; Notable for the following:    Pro B Natriuretic peptide (BNP) 1053.0 (*)    All other components within normal limits  URINALYSIS, ROUTINE W REFLEX MICROSCOPIC - Abnormal; Notable for the following:    Color, Urine AMBER (*)    APPearance TURBID (*)    Hgb urine dipstick LARGE (*)    Bilirubin Urine SMALL (*)    Protein, ur 30 (*)    Leukocytes, UA LARGE (*)    All other components within normal limits  URINE MICROSCOPIC-ADD ON - Abnormal; Notable for the following:    Squamous Epithelial / LPF FEW (*)    Bacteria, UA FEW (*)    Casts HYALINE CASTS (*)    All other components within normal limits  CK - Abnormal; Notable for the following:    Total CK 207 (*)    All other components within  normal limits  TSH - Abnormal; Notable for the following:    TSH 5.100 (*)    All other components within normal limits  BASIC METABOLIC PANEL - Abnormal; Notable for the following:    Glucose, Bld 108 (*)    BUN 24 (*)    Creatinine, Ser 1.18 (*)    GFR calc non Af Amer 40 (*)    GFR calc Af Amer 46 (*)    All other components within normal limits  CBC - Abnormal; Notable for the following:    RBC 3.52 (*)    MCV 102.6 (*)    MCH 34.7 (*)    All other components within normal limits  URINE CULTURE  TROPONIN I    Imaging Review Dg Chest 2 View  03/15/2014   CLINICAL DATA:  Recurrent fall.  EXAM: CHEST  2 VIEW  COMPARISON:  12/19/2005  FINDINGS: Mild cardiac enlargement. Calcified atherosclerotic disease involves the thoracic aorta. No pleural effusion or interstitial edema. Interstitial coarsening is identified bilaterally and is likely chronic. No airspace consolidation identified. Degenerative disc disease noted within the thoracic spine.  IMPRESSION: 1. No acute findings. 2. Chronic interstitial coarsening 3. Mild cardiac enlargement. 4. Atherosclerotic disease.   Electronically Signed   By: Signa Kell M.D.   On: 03/15/2014 13:18   Dg Thoracic Spine 2  View  03/15/2014   CLINICAL DATA:  Fall.  Back pain  EXAM: THORACIC SPINE - 2 VIEW  COMPARISON:  Chest two-view 05 of 2028 2007  FINDINGS: Progressive disc degeneration and osteophyte formation in the mid and lower thoracic spine since 2007. Apical pleural scarring bilaterally  Mild loss of vertebral height approximately T10 and T11. These are consistent with fracture most likely chronic however recent fracture not excluded. No other fracture.  IMPRESSION: Mild compression fracture approximately T10 and T11 likely chronic however if the patient has pain in this area, MRI may be helpful to evaluate for bone marrow edema.   Electronically Signed   By: Marlan Palau M.D.   On: 03/15/2014 13:12   Dg Lumbar Spine Complete  03/15/2014   CLINICAL DATA:  Recurrent falls. Upper back pain. Evaluate for infection or injury.  EXAM: LUMBAR SPINE - COMPLETE 4+ VIEW  COMPARISON:  Lumbar MRI and radiographs 05/29/2006.  FINDINGS: The bones are diffusely demineralized. The alignment is stable and near anatomic. There is a mild scoliosis. There is multilevel spondylosis with disc space loss and paraspinal osteophytes. There is probable autofusion anteriorly across the L1-2 disc space. There is no evidence of acute fracture or paraspinal abnormality. There is no evidence of endplate destruction or discitis. Atherosclerosis of the aorta is noted.  IMPRESSION: Progressive spondylosis since 2007. No acute osseous findings or radiographic signs of infection.   Electronically Signed   By: Roxy Horseman M.D.   On: 03/15/2014 13:12   Dg Pelvis 1-2 Views  03/15/2014   CLINICAL DATA:  Recurrent falls.  Evaluate for infection or injury.  EXAM: PELVIS - 1-2 VIEW  COMPARISON:  None.  FINDINGS: The bones are demineralized. There is no evidence of acute fracture or dislocation. There are mild degenerative changes of both hips and sacroiliac joints. No focal soft tissue abnormalities are identified.  IMPRESSION: Osteopenia without  demonstrated acute osseous findings or radiographic evidence of infection.  If the patient has hip pain or inability to bear weight, follow up imaging may be warranted as hip fractures can be radiographically occult in the elderly.   Electronically Signed   By:  Roxy Horseman M.D.   On: 03/15/2014 13:14   Ct Head Wo Contrast  03/15/2014   CLINICAL DATA:  Falling x3 days  EXAM: CT HEAD WITHOUT CONTRAST  TECHNIQUE: Contiguous axial images were obtained from the base of the skull through the vertex without intravenous contrast.  COMPARISON:  None.  FINDINGS: There is prominence of the sulci and ventricles compatible with brain atrophy. Low-attenuation structure within the left basal ganglia compatible with either chronic lacunar infarct or dilated perivascular space. Patchy low attenuation within the subcortical and periventricular white matter is noted compatible with chronic small vessel ischemic disease. Encephalomalacia within the right cerebellar hemisphere is noted, image 6/series 2. There is no evidence for acute infarct, hemorrhage or mass. The paranasal sinuses are clear. The mastoid air cells are clear. The skull is intact.  IMPRESSION: 1. No acute intracranial abnormalities. 2. Chronic small vessel ischemic disease and brain atrophy. 3. Chronic right cerebellar infarct.   Electronically Signed   By: Signa Kell M.D.   On: 03/15/2014 13:21     EKG Interpretation   Date/Time:  Saturday March 15 2014 12:03:09 EDT Ventricular Rate:  84 PR Interval:  215 QRS Duration: 142 QT Interval:  434 QTC Calculation: 513 R Axis:   -54 Text Interpretation:  Sinus rhythm Borderline prolonged PR interval Left  bundle branch block No significant change since last tracing Confirmed by  Allona Gondek  MD, Tzivia Oneil ([id number]) on 03/15/2014 12:22:55 PM      MDM   Final diagnoses:  UTI (lower urinary tract infection)  Recurrent falls    7:25 AM 80 y.o. female w hx of HTN, HLP, lower ext edema who presents with  recurrent falls over the last 3 days. The daughter is present on exam and states that the patient fell around 4 PM 2 days ago and was not found until 10 AM the next day. The daughter notes that she fell again last night and was found this morning. The patient is alert and oriented x3. She believes that she is having mechanical falls. She has chronic lower extremity edema which has worsened recently and is likely related to her in balance issues. The patient has no obvious traumatic injury on exam. She is mildly hypertensive here but vital signs are otherwise unremarkable. She complains of some nonspecific back pain on exam but has no other complaints. Will workup for traumatic injuries or infectious issues.   Will admit to hospitalist.   Purvis Sheffield, MD 03/16/14 [id number]

## 2014-03-15 NOTE — H&P (Signed)
[facility] History and Physical  Kristine [name] ZOX:[id number] DOB: 03-May-1924 DOA: 03/15/2014  Referring physician: Dr. Romeo Apple PCP: No primary provider on file.   Chief Complaint: Weakness  HPI: Kristine [name] is a 80 y.o. female  With history of hypothyroidism, chronic kidney disease stage III,  and hypertension. Patient has recently reported feeling generalized weakness. She reports not eating all that well with him last week. She denies any dysuria or increased frequency. Family and patient reports that she has been weak and has fallen within the last week. Reportedly patient fell again today and as such family decided to bring patient to the emergency department for further evaluation. While in the ED patient was found to have a urinary tract infection. Family did not feel comfortable taking patient home as such we were consulted for further medical evaluation and recommendations.   Review of Systems:  Constitutional:  No weight loss, night sweats, Fevers, chills, fatigue.  HEENT:  No headaches, Difficulty swallowing,Tooth/dental problems,Sore throat,  No sneezing, itching, ear ache, nasal congestion, post nasal drip,  Cardio-vascular:  No chest pain, Orthopnea, PND, swelling in lower extremities, anasarca, dizziness, palpitations  GI:  No heartburn, indigestion, abdominal pain, nausea, vomiting, diarrhea, change in bowel habits, loss of appetite  Resp:  No shortness of breath with exertion or at rest. No excess mucus, no productive cough, No non-productive cough, No coughing up of blood.No change in color of mucus.No wheezing.No chest wall deformity  Skin:  no rash or lesions.  GU:  no dysuria, change in color of urine, no urgency or frequency. No flank pain.  Musculoskeletal:  No joint pain or swelling. No decreased range of motion. No back pain.  Psych:  No change in mood or affect. No depression or anxiety. No memory loss.   Past Medical History  Diagnosis Date    . Hypertension   . Hyperlipidemia   . Bilateral leg edema    Past Surgical History  Procedure Laterality Date  . Ankle fracture surgery    . Cholecystectomy     Social History:  reports that she has never smoked. She does not have any smokeless tobacco history on file. She reports that she does not drink alcohol or use illicit drugs.  Allergies  Allergen Reactions  . Amitriptyline     Weak sluggish  . Buspar [Buspirone]     Increased anxiety   . Codeine     Lethargic gitter   . Dyazide [Hydrochlorothiazide W-Triamterene]     weakness  . Macrolides And Ketolides     Rash     History reviewed. No pertinent family history.   Prior to Admission medications   Medication Sig Start Date End Date Taking? Authorizing Provider  acetaminophen (TYLENOL) 500 MG tablet Take 500 mg by mouth every 6 (six) hours as needed for mild pain.   Yes Historical Provider, MD  amLODipine (NORVASC) 5 MG tablet Take 5 mg by mouth daily.   Yes Historical Provider, MD  carvedilol (COREG) 25 MG tablet Take 25 mg by mouth 2 (two) times daily with a meal.   Yes Historical Provider, MD  cholecalciferol (VITAMIN D) 1000 UNITS tablet Take 1,000 Units by mouth daily.   Yes Historical Provider, MD  furosemide (LASIX) 20 MG tablet Take 20 mg by mouth daily as needed.   Yes Historical Provider, MD  gabapentin (NEURONTIN) 300 MG capsule Take 300 mg by mouth daily.   Yes Historical Provider, MD  irbesartan (AVAPRO) 300 MG tablet Take 300 mg  by mouth daily.   Yes Historical Provider, MD  levothyroxine (SYNTHROID, LEVOTHROID) 100 MCG tablet Take 100 mcg by mouth daily before breakfast.   Yes Historical Provider, MD  levothyroxine (SYNTHROID, LEVOTHROID) 88 MCG tablet Take 88 mcg by mouth daily before breakfast. With 100  Mcg + 188 mcg   Yes Historical Provider, MD  nystatin cream (MYCOSTATIN) Apply 1 application topically 2 (two) times daily.   Yes Historical Provider, MD  rosuvastatin (CRESTOR) 5 MG tablet Take 5 mg  by mouth daily.   Yes Historical Provider, MD  traMADol (ULTRAM) 50 MG tablet Take 50 mg by mouth every 12 (twelve) hours as needed for moderate pain.   Yes Historical Provider, MD  vitamin B-12 (CYANOCOBALAMIN) 1000 MCG tablet Take 1,000 mcg by mouth daily.   Yes Historical Provider, MD   Physical Exam: Filed Vitals:   03/15/14 1119 03/15/14 1120 03/15/14 1612 03/15/14 1749  BP: 193/60  171/65 175/79  Pulse: 81  89 84  Temp: 97.9 F (36.6 C)  98.2 F (36.8 C) 98.9 F (37.2 C)  TempSrc: Oral  Oral Oral  Resp: 18  16 16   SpO2: 96% 95% 95% 96%    Wt Readings from Last 3 Encounters:  No data found for Wt    General:  Appears calm and comfortable Eyes: PERRL, normal lids, irises & conjunctiva ZOX:WRUE of hearing, normal lips & tongue. Poor oral hygiene Neck: no LAD, masses or thyromegaly Cardiovascular: RRR, no m/r/g. No LE edema. Telemetry: SR, no arrhythmias  Respiratory: CTA bilaterally, no w/r/r. Normal respiratory effort. Abdomen: soft, positive suprapubic discomfort, nondistended Skin: no rash or induration seen on limited exam Musculoskeletal: grossly normal tone BUE/BLE Psychiatric: grossly normal mood and affect, speech fluent and appropriate Neurologic: grossly non-focal.          Labs on Admission:  Basic Metabolic Panel:  Recent Labs Lab 03/15/14 1206  NA 139  K 4.5  CL 101  CO2 21  GLUCOSE 113*  BUN 30*  CREATININE 1.47*  CALCIUM 9.7   Liver Function Tests:  Recent Labs Lab 03/15/14 1206  AST 20  ALT 10  ALKPHOS 76  BILITOT 1.0  PROT 7.1  ALBUMIN 3.6   No results found for this basename: LIPASE, AMYLASE,  in the last 168 hours No results found for this basename: AMMONIA,  in the last 168 hours CBC:  Recent Labs Lab 03/15/14 1206  WBC 10.5  NEUTROABS 7.7  HGB 13.4  HCT 39.4  MCV 101.8*  PLT 186   Cardiac Enzymes:  Recent Labs Lab 03/15/14 1206 03/15/14 1532  CKTOTAL  --  207*  TROPONINI <0.30  --     BNP (last 3  results)  Recent Labs  03/15/14 1206  PROBNP 1053.0*   CBG: No results found for this basename: GLUCAP,  in the last 168 hours  Radiological Exams on Admission: Dg Chest 2 View  03/15/2014   CLINICAL DATA:  Recurrent fall.  EXAM: CHEST  2 VIEW  COMPARISON:  12/19/2005  FINDINGS: Mild cardiac enlargement. Calcified atherosclerotic disease involves the thoracic aorta. No pleural effusion or interstitial edema. Interstitial coarsening is identified bilaterally and is likely chronic. No airspace consolidation identified. Degenerative disc disease noted within the thoracic spine.  IMPRESSION: 1. No acute findings. 2. Chronic interstitial coarsening 3. Mild cardiac enlargement. 4. Atherosclerotic disease.   Electronically Signed   By: Signa Kell M.D.   On: 03/15/2014 13:18   Dg Thoracic Spine 2 View  03/15/2014   CLINICAL DATA:  Fall.  Back pain  EXAM: THORACIC SPINE - 2 VIEW  COMPARISON:  Chest two-view 05 of 2028 2007  FINDINGS: Progressive disc degeneration and osteophyte formation in the mid and lower thoracic spine since 2007. Apical pleural scarring bilaterally  Mild loss of vertebral height approximately T10 and T11. These are consistent with fracture most likely chronic however recent fracture not excluded. No other fracture.  IMPRESSION: Mild compression fracture approximately T10 and T11 likely chronic however if the patient has pain in this area, MRI may be helpful to evaluate for bone marrow edema.   Electronically Signed   By: Marlan Palauharles  Clark M.D.   On: 03/15/2014 13:12   Dg Lumbar Spine Complete  03/15/2014   CLINICAL DATA:  Recurrent falls. Upper back pain. Evaluate for infection or injury.  EXAM: LUMBAR SPINE - COMPLETE 4+ VIEW  COMPARISON:  Lumbar MRI and radiographs 05/29/2006.  FINDINGS: The bones are diffusely demineralized. The alignment is stable and near anatomic. There is a mild scoliosis. There is multilevel spondylosis with disc space loss and paraspinal osteophytes. There is  probable autofusion anteriorly across the L1-2 disc space. There is no evidence of acute fracture or paraspinal abnormality. There is no evidence of endplate destruction or discitis. Atherosclerosis of the aorta is noted.  IMPRESSION: Progressive spondylosis since 2007. No acute osseous findings or radiographic signs of infection.   Electronically Signed   By: Roxy HorsemanBill  Veazey M.D.   On: 03/15/2014 13:12   Dg Pelvis 1-2 Views  03/15/2014   CLINICAL DATA:  Recurrent falls.  Evaluate for infection or injury.  EXAM: PELVIS - 1-2 VIEW  COMPARISON:  None.  FINDINGS: The bones are demineralized. There is no evidence of acute fracture or dislocation. There are mild degenerative changes of both hips and sacroiliac joints. No focal soft tissue abnormalities are identified.  IMPRESSION: Osteopenia without demonstrated acute osseous findings or radiographic evidence of infection.  If the patient has hip pain or inability to bear weight, follow up imaging may be warranted as hip fractures can be radiographically occult in the elderly.   Electronically Signed   By: Roxy HorsemanBill  Veazey M.D.   On: 03/15/2014 13:14   Ct Head Wo Contrast  03/15/2014   CLINICAL DATA:  Falling x3 days  EXAM: CT HEAD WITHOUT CONTRAST  TECHNIQUE: Contiguous axial images were obtained from the base of the skull through the vertex without intravenous contrast.  COMPARISON:  None.  FINDINGS: There is prominence of the sulci and ventricles compatible with brain atrophy. Low-attenuation structure within the left basal ganglia compatible with either chronic lacunar infarct or dilated perivascular space. Patchy low attenuation within the subcortical and periventricular white matter is noted compatible with chronic small vessel ischemic disease. Encephalomalacia within the right cerebellar hemisphere is noted, image 6/series 2. There is no evidence for acute infarct, hemorrhage or mass. The paranasal sinuses are clear. The mastoid air cells are clear. The skull is  intact.  IMPRESSION: 1. No acute intracranial abnormalities. 2. Chronic small vessel ischemic disease and brain atrophy. 3. Chronic right cerebellar infarct.   Electronically Signed   By: Signa Kellaylor  Stroud M.D.   On: 03/15/2014 13:21    EKG: Independently reviewed. Sinus rhythm  Assessment/Plan Principal Problem:   UTI (lower urinary tract infection) - Rocephin - f/u urine cx  Active Problems:   HYPOTHYROIDISM NOS - continue synthroid - Given report of weakness will obtain TSH    HYPERTENSION, BENIGN ESSENTIAL -Continue carvedilol and amlodipine    KIDNEY DISEASE, CHRONIC, STAGE III -  Will hold Avapro - Reassess serum creatinine next a.m.  Code Status: full DVT Prophylaxis: heparin Family Communication: d/c daughter at bedside Disposition Plan: Pending improvement in condition  Time spent: > 35 minutes  Penny Pia [facility] Pager [phone]  **Disclaimer: This note may have been dictated with voice recognition software. Similar sounding words can inadvertently be transcribed and this note may contain transcription errors which may not have been corrected upon publication of note.**

## 2014-03-15 NOTE — ED Notes (Signed)
PER PTAR pt picked up from home c/o fall x3 days.  Pt has been experiencing falls over the past 3 days.  Pt fell 2 days and reported to be on the floor all day and night.  Found by daughter inlaw and pt refused to be receive care.  Pt fell again this morning around 830a.  Denies head injury or loc.  Pt arrived to ED alert and oriented per baseline.  C/o l side and back pain and bilateral leg pain.  Bilateral leg pitting edema.  Reports not eating since last night.  No deformities noted.  Pt hasn't had any medications this morning. Pt lives alone.

## 2014-03-16 DIAGNOSIS — E039 Hypothyroidism, unspecified: Secondary | ICD-10-CM | POA: Diagnosis present

## 2014-03-16 DIAGNOSIS — Z9181 History of falling: Secondary | ICD-10-CM | POA: Diagnosis not present

## 2014-03-16 DIAGNOSIS — Z79899 Other long term (current) drug therapy: Secondary | ICD-10-CM | POA: Diagnosis not present

## 2014-03-16 DIAGNOSIS — E785 Hyperlipidemia, unspecified: Secondary | ICD-10-CM | POA: Diagnosis present

## 2014-03-16 DIAGNOSIS — Z885 Allergy status to narcotic agent status: Secondary | ICD-10-CM | POA: Diagnosis not present

## 2014-03-16 DIAGNOSIS — Z881 Allergy status to other antibiotic agents status: Secondary | ICD-10-CM | POA: Diagnosis not present

## 2014-03-16 DIAGNOSIS — Z888 Allergy status to other drugs, medicaments and biological substances status: Secondary | ICD-10-CM | POA: Diagnosis not present

## 2014-03-16 DIAGNOSIS — R5381 Other malaise: Secondary | ICD-10-CM | POA: Diagnosis present

## 2014-03-16 DIAGNOSIS — B3749 Other urogenital candidiasis: Secondary | ICD-10-CM | POA: Diagnosis present

## 2014-03-16 DIAGNOSIS — I129 Hypertensive chronic kidney disease with stage 1 through stage 4 chronic kidney disease, or unspecified chronic kidney disease: Secondary | ICD-10-CM | POA: Diagnosis present

## 2014-03-16 DIAGNOSIS — N183 Chronic kidney disease, stage 3 unspecified: Secondary | ICD-10-CM | POA: Diagnosis present

## 2014-03-16 LAB — CBC
HCT: 36.1 % (ref 36.0–46.0)
Hemoglobin: 12.2 g/dL (ref 12.0–15.0)
MCH: 34.7 pg — ABNORMAL HIGH (ref 26.0–34.0)
MCHC: 33.8 g/dL (ref 30.0–36.0)
MCV: 102.6 fL — ABNORMAL HIGH (ref 78.0–100.0)
PLATELETS: 193 10*3/uL (ref 150–400)
RBC: 3.52 MIL/uL — ABNORMAL LOW (ref 3.87–5.11)
RDW: 13.8 % (ref 11.5–15.5)
WBC: 7.7 10*3/uL (ref 4.0–10.5)

## 2014-03-16 LAB — BASIC METABOLIC PANEL
Anion gap: 11 (ref 5–15)
BUN: 24 mg/dL — ABNORMAL HIGH (ref 6–23)
CO2: 25 mEq/L (ref 19–32)
Calcium: 9.2 mg/dL (ref 8.4–10.5)
Chloride: 104 mEq/L (ref 96–112)
Creatinine, Ser: 1.18 mg/dL — ABNORMAL HIGH (ref 0.50–1.10)
GFR, EST AFRICAN AMERICAN: 46 mL/min — AB (ref >90)
GFR, EST NON AFRICAN AMERICAN: 40 mL/min — AB (ref >90)
Glucose, Bld: 108 mg/dL — ABNORMAL HIGH (ref 70–99)
POTASSIUM: 4.9 meq/L (ref 3.7–5.3)
SODIUM: 140 meq/L (ref 137–147)

## 2014-03-16 LAB — URINE CULTURE

## 2014-03-16 MED ORDER — AMLODIPINE BESYLATE 10 MG PO TABS
10.0000 mg | ORAL_TABLET | Freq: Every day | ORAL | Status: DC
  Administered 2014-03-17: 10 mg via ORAL
  Filled 2014-03-16 (×2): qty 1

## 2014-03-16 MED ORDER — FLUCONAZOLE 100MG IVPB
100.0000 mg | INTRAVENOUS | Status: DC
  Administered 2014-03-16 – 2014-03-17 (×2): 100 mg via INTRAVENOUS
  Filled 2014-03-16 (×3): qty 50

## 2014-03-16 NOTE — Progress Notes (Signed)
ANTIBIOTIC CONSULT NOTE - INITIAL  Pharmacy Consult for Diflucan  Indication: UTI  Allergies  Allergen Reactions  . Amitriptyline     Weak sluggish  . Buspar [Buspirone]     Increased anxiety   . Codeine     Lethargic gitter   . Dyazide [Hydrochlorothiazide W-Triamterene]     weakness  . Macrolides And Ketolides     Rash     Patient Measurements: Height:  (165.1 cm) Weight: 205 lb 14.6 oz (93.4 kg) IBW/kg (Calculated) : 57  Vital Signs: Temp: 98.1 F (36.7 C) (08/23 1451) Temp src: Oral (08/23 1451) BP: 171/57 mmHg (08/23 1451) Pulse Rate: 71 (08/23 1451) Intake/Output from previous day: 08/22 0701 - 08/23 0700 In: 1525.8 [P.O.:1030; I.V.:495.8] Out: 700 [Urine:700] Intake/Output from this shift: Total I/O In: 1170 [P.O.:720; I.V.:450] Out: -   Labs:  Recent Labs  03/15/14 1206 03/16/14 0450  WBC 10.5 7.7  HGB 13.4 12.2  PLT 186 193  CREATININE 1.47* 1.18*   Estimated Creatinine Clearance: 37.2 ml/min (by C-G formula based on Cr of 1.18). No results found for this basename: Rolm Gala, VANCORANDOM, GENTTROUGH, GENTPEAK, GENTRANDOM, TOBRATROUGH, TOBRAPEAK, TOBRARND, AMIKACINPEAK, AMIKACINTROU, AMIKACIN,  in the last 72 hours   Microbiology: Recent Results (from the past 720 hour(s))  URINE CULTURE     Status: None   Collection Time    03/15/14 12:06 PM      Result Value Ref Range Status   Specimen Description URINE, CATHETERIZED   Final   Special Requests NONE   Final   Culture  Setup Time     Final   Value: 03/15/2014 18:10     Performed at [facility] Count     Final   Value: 50,000 COLONIES/ML     Performed at [facility]   Culture     Final   Value: YEAST     Performed at [facility]   Report Status 03/16/2014 FINAL   Final    Medical History: Past Medical History  Diagnosis Date  . Hypertension   . Hyperlipidemia   . Bilateral leg edema     Medications:  Scheduled:  .  amLODipine  5 mg Oral Daily  . atorvastatin  10 mg Oral q1800  . carvedilol  25 mg Oral BID WC  . cholecalciferol  1,000 Units Oral Daily  . furosemide  20 mg Oral Daily  . gabapentin  300 mg Oral Daily  . heparin  5,000 Units Subcutaneous 3 times per day  . levothyroxine  100 mcg Oral QAC breakfast  . levothyroxine  88 mcg Oral QAC breakfast  . vitamin B-12  1,000 mcg Oral Daily   Infusions:    Assessment: 80 yo female presented to ER with CC weakness on 8/22 with hx hypothyroidism, CKD III, HTN. Patient was started on Rocephin yesterday but culture is growing yeast so Diflucan per pharmacy dosing is to start.   8/22 >> Rocephin >> 8/23 8/23 >> Diflucan >>   Afebrile  WBC WNL  SCr 1.18, CrCl 37  Goal of Therapy:  treatment of infection  Plan:  Diflucan  IV daily for CrCl < 50 ml/min   Hessie Knows, PharmD, BCPS Pager [phone] 03/16/2014 4:16 PM

## 2014-03-16 NOTE — Progress Notes (Signed)
Utilization Review Completed.   Lasaro Primm, RN, BSN Nurse Case Manager  

## 2014-03-16 NOTE — Progress Notes (Signed)
[facility] PROGRESS NOTE  TISA WEISEL ZOX:[id number] DOB: 1924-07-03 DOA: 03/15/2014 PCP: No primary provider on file.  Assessment/Plan:  Principal Problem:   UTI (lower urinary tract infection) -Growing yeast -Will discontinue Rocephin and start antifungal  Active Problems:   HYPOTHYROIDISM NOS - Stable continue home regimen    HYPERTENSION, BENIGN ESSENTIAL - Currently not well controlled we'll increase amlodipine dose, continue carvedilol    KIDNEY DISEASE, CHRONIC, STAGE III -Stable currently with serum creatinine improving with gentle IV fluid rehydration.  Code Status: Full Family Communication: Discussed with daughter at bedside Disposition Plan: Physical therapy evaluation most likely will require SNF   Consultants:  None  Procedures:  none  Antibiotics:  Rocephin >>>8/23   Diflucan 03/16/14  HPI/Subjective: No new complaints. Walked with physical therapy and became tired.  Objective: Filed Vitals:   03/16/14 1451  BP: 171/57  Pulse: 71  Temp: 98.1 F (36.7 C)  Resp: 16    Intake/Output Summary (Last 24 hours) at 03/16/14 1646 Last data filed at 03/16/14 1500  Gross per 24 hour  Intake 2695.83 ml  Output    700 ml  Net 1995.83 ml   Filed Weights   03/15/14 2123  Weight: 93.4 kg (205 lb 14.6 oz)    Exam:   General:  Pt in nad, alert and awake  Cardiovascular: rrr, no mrg  Respiratory: cta bl, no wheezes  Abdomen: soft, NT, ND  Musculoskeletal: no cyanosis or clubbing   Data Reviewed: Basic Metabolic Panel:  Recent Labs Lab 03/15/14 1206 03/16/14 0450  NA 139 140  K 4.5 4.9  CL 101 104  CO2 21 25  GLUCOSE 113* 108*  BUN 30* 24*  CREATININE 1.47* 1.18*  CALCIUM 9.7 9.2   Liver Function Tests:  Recent Labs Lab 03/15/14 1206  AST 20  ALT 10  ALKPHOS 76  BILITOT 1.0  PROT 7.1  ALBUMIN 3.6   No results found for this basename: LIPASE, AMYLASE,  in the last 168 hours No results found for this basename:  AMMONIA,  in the last 168 hours CBC:  Recent Labs Lab 03/15/14 1206 03/16/14 0450  WBC 10.5 7.7  NEUTROABS 7.7  --   HGB 13.4 12.2  HCT 39.4 36.1  MCV 101.8* 102.6*  PLT 186 193   Cardiac Enzymes:  Recent Labs Lab 03/15/14 1206 03/15/14 1532  CKTOTAL  --  207*  TROPONINI <0.30  --    BNP (last 3 results)  Recent Labs  03/15/14 1206  PROBNP 1053.0*   CBG: No results found for this basename: GLUCAP,  in the last 168 hours  Recent Results (from the past 240 hour(s))  URINE CULTURE     Status: None   Collection Time    03/15/14 12:06 PM      Result Value Ref Range Status   Specimen Description URINE, CATHETERIZED   Final   Special Requests NONE   Final   Culture  Setup Time     Final   Value: 03/15/2014 18:10     Performed at [facility] Count     Final   Value: 50,000 COLONIES/ML     Performed at [facility]   Culture     Final   Value: YEAST     Performed at [facility]   Report Status 03/16/2014 FINAL   Final     Studies: Dg Chest 2 View  03/15/2014   CLINICAL DATA:  Recurrent fall.  EXAM: CHEST  2 VIEW  COMPARISON:  12/19/2005  FINDINGS: Mild cardiac enlargement. Calcified atherosclerotic disease involves the thoracic aorta. No pleural effusion or interstitial edema. Interstitial coarsening is identified bilaterally and is likely chronic. No airspace consolidation identified. Degenerative disc disease noted within the thoracic spine.  IMPRESSION: 1. No acute findings. 2. Chronic interstitial coarsening 3. Mild cardiac enlargement. 4. Atherosclerotic disease.   Electronically Signed   By: Signa Kell M.D.   On: 03/15/2014 13:18   Dg Thoracic Spine 2 View  03/15/2014   CLINICAL DATA:  Fall.  Back pain  EXAM: THORACIC SPINE - 2 VIEW  COMPARISON:  Chest two-view 05 of 2028 2007  FINDINGS: Progressive disc degeneration and osteophyte formation in the mid and lower thoracic spine since 2007. Apical pleural scarring bilaterally   Mild loss of vertebral height approximately T10 and T11. These are consistent with fracture most likely chronic however recent fracture not excluded. No other fracture.  IMPRESSION: Mild compression fracture approximately T10 and T11 likely chronic however if the patient has pain in this area, MRI may be helpful to evaluate for bone marrow edema.   Electronically Signed   By: Marlan Palau M.D.   On: 03/15/2014 13:12   Dg Lumbar Spine Complete  03/15/2014   CLINICAL DATA:  Recurrent falls. Upper back pain. Evaluate for infection or injury.  EXAM: LUMBAR SPINE - COMPLETE 4+ VIEW  COMPARISON:  Lumbar MRI and radiographs 05/29/2006.  FINDINGS: The bones are diffusely demineralized. The alignment is stable and near anatomic. There is a mild scoliosis. There is multilevel spondylosis with disc space loss and paraspinal osteophytes. There is probable autofusion anteriorly across the L1-2 disc space. There is no evidence of acute fracture or paraspinal abnormality. There is no evidence of endplate destruction or discitis. Atherosclerosis of the aorta is noted.  IMPRESSION: Progressive spondylosis since 2007. No acute osseous findings or radiographic signs of infection.   Electronically Signed   By: Roxy Horseman M.D.   On: 03/15/2014 13:12   Dg Pelvis 1-2 Views  03/15/2014   CLINICAL DATA:  Recurrent falls.  Evaluate for infection or injury.  EXAM: PELVIS - 1-2 VIEW  COMPARISON:  None.  FINDINGS: The bones are demineralized. There is no evidence of acute fracture or dislocation. There are mild degenerative changes of both hips and sacroiliac joints. No focal soft tissue abnormalities are identified.  IMPRESSION: Osteopenia without demonstrated acute osseous findings or radiographic evidence of infection.  If the patient has hip pain or inability to bear weight, follow up imaging may be warranted as hip fractures can be radiographically occult in the elderly.   Electronically Signed   By: Roxy Horseman M.D.   On:  03/15/2014 13:14   Ct Head Wo Contrast  03/15/2014   CLINICAL DATA:  Falling x3 days  EXAM: CT HEAD WITHOUT CONTRAST  TECHNIQUE: Contiguous axial images were obtained from the base of the skull through the vertex without intravenous contrast.  COMPARISON:  None.  FINDINGS: There is prominence of the sulci and ventricles compatible with brain atrophy. Low-attenuation structure within the left basal ganglia compatible with either chronic lacunar infarct or dilated perivascular space. Patchy low attenuation within the subcortical and periventricular white matter is noted compatible with chronic small vessel ischemic disease. Encephalomalacia within the right cerebellar hemisphere is noted, image 6/series 2. There is no evidence for acute infarct, hemorrhage or mass. The paranasal sinuses are clear. The mastoid air cells are clear. The skull is intact.  IMPRESSION: 1. No acute intracranial  abnormalities. 2. Chronic small vessel ischemic disease and brain atrophy. 3. Chronic right cerebellar infarct.   Electronically Signed   By: Signa Kell M.D.   On: 03/15/2014 13:21    Scheduled Meds: . amLODipine  5 mg Oral Daily  . atorvastatin  10 mg Oral q1800  . carvedilol  25 mg Oral BID WC  . cholecalciferol  1,000 Units Oral Daily  . fluconazole (DIFLUCAN) IV  100 mg Intravenous Q24H  . furosemide  20 mg Oral Daily  . gabapentin  300 mg Oral Daily  . heparin  5,000 Units Subcutaneous 3 times per day  . levothyroxine  100 mcg Oral QAC breakfast  . levothyroxine  88 mcg Oral QAC breakfast  . vitamin B-12  1,000 mcg Oral Daily   Continuous Infusions:    Time spent: > 35 minutes    Penny Pia  [facility] Pager [phone] If 7PM-7AM, please contact night-coverage at [email or web address], password [facility] 03/16/2014, 4:46 PM  LOS: 1 day

## 2014-03-16 NOTE — Evaluation (Signed)
Physical Therapy Evaluation Patient Details Name: Kristine [name] MRN: [id number] DOB: April 09, 1924 Today's Date: 03/16/2014   History of Present Illness  80 yo female admitted 03/15/14 Family and patient reports that she has been weak and has fallen within the last week. Reportedly patient fell again today and as such family decided to bring patient to the emergency department for further evaluation. While in the ED patient was found to have a urinary tract infection  Clinical Impression  Pt reports sharp pains on R lateral chest wall, especially with rolling and standing. Pt will benefit from PT to address problems listed in note below. Pt does not have 24/7 caregivers.    Follow Up Recommendations SNF;Supervision/Assistance - 24 hour    Equipment Recommendations  None recommended by PT    Recommendations for Other Services       Precautions / Restrictions Precautions Precautions: Fall Precaution Comments: incontinenece      Mobility  Bed Mobility Overal bed mobility: Needs Assistance;+2 for physical assistance;+ 2 for safety/equipment Bed Mobility: Supine to Sit;Sit to Supine     Supine to sit: Max assist;+2 for physical assistance;+2 for safety/equipment;HOB elevated Sit to supine: Total assist;+2 for physical assistance;+2 for safety/equipment   General bed mobility comments: use of bed pad to slide patient around to edge and back into bed.  Transfers Overall transfer level: Needs assistance Equipment used: Rolling walker (2 wheeled) Transfers: Sit to/from Stand Sit to Stand: Max assist;+2 physical assistance;+2 safety/equipment;From elevated surface         General transfer comment: pt stood x 2 from bed with lifting support. Attempted side steps but R side pain preventing   Ambulation/Gait                Stairs            Wheelchair Mobility    Modified Rankin (Stroke Patients Only)       Balance Overall balance assessment: Needs  assistance;History of Falls Sitting-balance support: No upper extremity supported;Feet supported Sitting balance-Leahy Scale: Fair                                       Pertinent Vitals/Pain Pain Assessment: Faces Faces Pain Scale: Hurts worst Pain Location: insicates R lateral to posterior chest wall Pain Descriptors / Indicators: Crying;Discomfort;Guarding;Sharp Pain Intervention(s): Limited activity within patient's tolerance;Monitored during session;Premedicated before session;Repositioned    Home Living Family/patient expects to be discharged to:: Private residence Living Arrangements: Alone;Other relatives;Children Available Help at Discharge: Family;Available PRN/intermittently Type of Home: House       Home Layout: One level Home Equipment: Walker - 4 wheels Additional Comments: per daughter in law,  noone available 24/7    Prior Function Level of Independence: Needs assistance   Gait / Transfers Assistance Needed: has been using WC recently, assist for meals and transfers           Hand Dominance        Extremity/Trunk Assessment   Upper Extremity Assessment: Generalized weakness           Lower Extremity Assessment: Generalized weakness      Cervical / Trunk Assessment: Kyphotic  Communication      Cognition Arousal/Alertness: Awake/alert Behavior During Therapy: WFL for tasks assessed/performed Overall Cognitive Status: History of cognitive impairments - at baseline  General Comments      Exercises        Assessment/Plan    PT Assessment Patient needs continued PT services  PT Diagnosis Difficulty walking;Acute pain   PT Problem List Decreased strength;Decreased activity tolerance;Decreased mobility;Decreased knowledge of precautions;Decreased safety awareness;Decreased knowledge of use of DME;Pain;Decreased cognition  PT Treatment Interventions DME instruction;Gait training;Functional  mobility training;Therapeutic activities;Therapeutic exercise;Patient/family education   PT Goals (Current goals can be found in the Care Plan section) Acute Rehab PT Goals Patient Stated Goal: agreed to try to walk PT Goal Formulation: With patient/family Time For Goal Achievement: 03/30/14 Potential to Achieve Goals: Good    Frequency Min 3X/week   Barriers to discharge Decreased caregiver support      Co-evaluation               End of Session Equipment Utilized During Treatment: Gait belt Activity Tolerance: Patient limited by pain Patient left: in bed;with call bell/phone within reach;with bed alarm set;with family/visitor present Nurse Communication: Mobility status         Time: 9528-4132 PT Time Calculation (min): 35 min   Charges:     PT Treatments $Therapeutic Activity: 23-37 mins   PT G Codes:          Rada Hay 03/16/2014, 4:08 PM Blanchard Kelch PT [phone]

## 2014-03-17 MED ORDER — ENSURE COMPLETE PO LIQD
237.0000 mL | Freq: Two times a day (BID) | ORAL | Status: DC
  Administered 2014-03-17 – 2014-03-18 (×3): 237 mL via ORAL

## 2014-03-17 NOTE — Progress Notes (Signed)
[facility] PROGRESS NOTE  Kristine [name] ZOX:[id number] DOB: 06/11/1924 DOA: 03/15/2014 PCP: No primary provider on file.  Assessment/Plan:  Principal Problem:   UTI (lower urinary tract infection) -Growing yeast -Pt on diflucan. Feeling better  Active Problems:   HYPOTHYROIDISM NOS - Stable continue home regimen, Suspected pt may not have been taking medication as directed at home. Would recommend rechecking TSH levels next am.     HYPERTENSION, BENIGN ESSENTIAL - Currently not well controlled we'll increase amlodipine dose, continue carvedilol    KIDNEY DISEASE, CHRONIC, STAGE III -Stable currently with serum creatinine improving with gentle IV fluid rehydration.  Code Status: Full Family Communication: Discussed with daughter at bedside Disposition Plan: Physical therapy evaluation most likely will require SNF   Consultants:  None  Procedures:  none  Antibiotics:  Rocephin >>>8/23   Diflucan 03/16/14  HPI/Subjective: No new complaints. Walked with physical therapy and became tired.  Objective: Filed Vitals:   03/17/14 0800  BP: 155/64  Pulse: 79  Temp:   Resp:     Intake/Output Summary (Last 24 hours) at 03/17/14 1736 Last data filed at 03/17/14 1230  Gross per 24 hour  Intake    820 ml  Output      0 ml  Net    820 ml   Filed Weights   03/15/14 2123  Weight: 93.4 kg (205 lb 14.6 oz)    Exam:   General:  Pt in nad, alert and awake  Cardiovascular: rrr, no mrg  Respiratory: cta bl, no wheezes  Abdomen: soft, NT, ND  Musculoskeletal: no cyanosis or clubbing   Data Reviewed: Basic Metabolic Panel:  Recent Labs Lab 03/15/14 1206 03/16/14 0450  NA 139 140  K 4.5 4.9  CL 101 104  CO2 21 25  GLUCOSE 113* 108*  BUN 30* 24*  CREATININE 1.47* 1.18*  CALCIUM 9.7 9.2   Liver Function Tests:  Recent Labs Lab 03/15/14 1206  AST 20  ALT 10  ALKPHOS 76  BILITOT 1.0  PROT 7.1  ALBUMIN 3.6   No results found for this  basename: LIPASE, AMYLASE,  in the last 168 hours No results found for this basename: AMMONIA,  in the last 168 hours CBC:  Recent Labs Lab 03/15/14 1206 03/16/14 0450  WBC 10.5 7.7  NEUTROABS 7.7  --   HGB 13.4 12.2  HCT 39.4 36.1  MCV 101.8* 102.6*  PLT 186 193   Cardiac Enzymes:  Recent Labs Lab 03/15/14 1206 03/15/14 1532  CKTOTAL  --  207*  TROPONINI <0.30  --    BNP (last 3 results)  Recent Labs  03/15/14 1206  PROBNP 1053.0*   CBG: No results found for this basename: GLUCAP,  in the last 168 hours  Recent Results (from the past 240 hour(s))  URINE CULTURE     Status: None   Collection Time    03/15/14 12:06 PM      Result Value Ref Range Status   Specimen Description URINE, CATHETERIZED   Final   Special Requests NONE   Final   Culture  Setup Time     Final   Value: 03/15/2014 18:10     Performed at [facility] Count     Final   Value: 50,000 COLONIES/ML     Performed at [facility]   Culture     Final   Value: YEAST     Performed at [facility]   Report Status 03/16/2014 FINAL  Final     Studies: No results found.  Scheduled Meds: . amLODipine  10 mg Oral Daily  . atorvastatin  10 mg Oral q1800  . carvedilol  25 mg Oral BID WC  . cholecalciferol  1,000 Units Oral Daily  . feeding supplement (ENSURE COMPLETE)  237 mL Oral BID BM  . fluconazole (DIFLUCAN) IV  100 mg Intravenous Q24H  . furosemide  20 mg Oral Daily  . gabapentin  300 mg Oral Daily  . heparin  5,000 Units Subcutaneous 3 times per day  . levothyroxine  100 mcg Oral QAC breakfast  . levothyroxine  88 mcg Oral QAC breakfast  . vitamin B-12  1,000 mcg Oral Daily   Continuous Infusions:    Time spent: > 35 minutes    Penny Pia  [facility] Pager [phone] If 7PM-7AM, please contact night-coverage at [email or web address], password [facility] 03/17/2014, 5:36 PM  LOS: 2 days

## 2014-03-17 NOTE — Progress Notes (Signed)
Physical Therapy Treatment Patient Details Name: Kristine [name] MRN: [id number] DOB: 04/18/1924 Today's Date: 03/17/2014    History of Present Illness 80 yo female admitted 03/15/14 Family and patient reports that she has been weak and has fallen within the last week. Reportedly patient fell again today and as such family decided to bring patient to the emergency department for further evaluation. While in the ED patient was found to have a urinary tract infection    PT Comments    Pt requires + assist and MAX VC's due to increased anxiety/fear of falling. Pt still demon intermittent confusion esp to current situation.  Pt will need SNF for ST Rehab.   Follow Up Recommendations  SNF     Equipment Recommendations       Recommendations for Other Services       Precautions / Restrictions Precautions Precautions: Fall Precaution Comments: incontinenece Restrictions Weight Bearing Restrictions: No    Mobility  Bed Mobility Overal bed mobility: Needs Assistance;+2 for physical assistance;+ 2 for safety/equipment Bed Mobility: Supine to Sit     Supine to sit: +2 for physical assistance;+2 for safety/equipment;HOB elevated;Total assist     General bed mobility comments: use of bed pad to slide patient around to edge plus increased time.  Transfers Overall transfer level: Needs assistance Equipment used: Rolling walker (2 wheeled) Transfers: Sit to/from Stand Sit to Stand: Max assist;+2 physical assistance;+2 safety/equipment;From elevated surface         General transfer comment: Max VC's to decrease anxiety/fear.  required several attempts and assisted off high bed to [facility] [place] then off BSC to recliner.    Ambulation/Gait         Gait velocity: Unable to attempt due to low pt performance during transfers       Stairs            Wheelchair Mobility    Modified Rankin (Stroke Patients Only)       Balance                                     Cognition                            Exercises      General Comments        Pertinent Vitals/Pain      Home Living                      Prior Function            PT Goals (current goals can now be found in the care plan section) Progress towards PT goals: Progressing toward goals    Frequency  Min 3X/week    PT Plan      Co-evaluation             End of Session Equipment Utilized During Treatment: Gait belt Activity Tolerance: Patient limited by pain;Other (comment) (anxiety) Patient left: in chair;with call bell/phone within reach     Time: 4098-1191 PT Time Calculation (min): 30 min  Charges:  $Therapeutic Activity: 23-37 mins                    G Codes:      Felecia Shelling  PTA [facility]  Acute  Rehab Pager      [phone]

## 2014-03-17 NOTE — Progress Notes (Signed)
Clinical Social Work Department CLINICAL SOCIAL WORK PLACEMENT NOTE 03/17/2014  Patient:  Kristine [name], Kristine [name]  Account Number:  [id number] Admit date:  03/15/2014  Clinical Social Worker:  Cori Razor, LCSW  Date/time:  03/17/2014 01:32 PM  Clinical Social Work is seeking post-discharge placement for this patient at the following level of care:   SKILLED NURSING   (*CSW will update this form in Epic as items are completed)   03/17/2014  Patient/family provided with [facility] Department of Clinical Social Work's list of facilities offering this level of care within the geographic area requested by the patient (or if unable, by the patient's family).  03/17/2014  Patient/family informed of their freedom to choose among providers that offer the needed level of care, that participate in Medicare, Medicaid or managed care program needed by the patient, have an available bed and are willing to accept the patient.    Patient/family informed of [facility]' ownership interest in [facility], as well as of the fact that they are under no obligation to receive care at this facility.  PASARR submitted to EDS on 03/17/2014 PASARR number received on 03/17/2014  FL2 transmitted to all facilities in geographic area requested by pt/family on  03/17/2014 FL2 transmitted to all facilities within larger geographic area on   Patient informed that his/her managed care company has contracts with or will negotiate with  certain facilities, including the following:     Patient/family informed of bed offers received:  03/17/2014 Patient chooses bed at [facility] Physician recommends and patient chooses bed at    Patient to be transferred to  on   Patient to be transferred to facility by  Patient and family notified of transfer on  Name of family member notified:    The following physician request were entered in Epic:   Additional Comments:   Cori Razor LCSW  [phone]

## 2014-03-17 NOTE — Progress Notes (Signed)
Clinical Social Work Department BRIEF PSYCHOSOCIAL ASSESSMENT 03/17/2014  Patient:  Kristine [name], Kristine [name]     Account Number:  [id number]     Admit date:  03/15/2014  Clinical Social Worker:  Lacie Scotts  Date/Time:  03/17/2014 01:24 PM  Referred by:  CSW  Date Referred:  03/17/2014 Referred for  SNF Placement   Other Referral:   Interview type:  Patient Other interview type:    PSYCHOSOCIAL DATA Living Status:  ALONE Admitted from facility:   Level of care:   Primary support name:  Mancel Parsons Primary support relationship to patient:  CHILD, ADULT Degree of support available:   supportive    CURRENT CONCERNS Current Concerns  Post-Acute Placement   Other Concerns:    SOCIAL WORK ASSESSMENT / PLAN Pt is an 80 yr old female living  at home prior to hospitalization. CSW met with pt this am to assist with d/c planning. PN reviewed. PT is recommending SNF placement at d/c. CSW reviewed recommendations with pt . CSW met with pt's daughter, [name], at bedside this afternoon.. Pt was sleeping soundly. Daughter understands her mother would like to return home but she is unable to manage her care at present status. Daughter feels rehab is needed. SNF search has been initiated and bed offers reviewed with daughter. [place] has been chosen. CSW will assist with insurance authorization. SNF will accept pending insurance approval.   Assessment/plan status:  Psychosocial Support/Ongoing Assessment of Needs Other assessment/ plan:   Information/referral to community resources:   Insurance coverage for SNF and ambulance auth reviewed.    PATIENT'S/FAMILY'S RESPONSE TO PLAN OF CARE: Pt's daughter will speak to her mother about [facility] Re[facility] placement. CSW will offer ongoing support and reassurance to help with transition. Pt will be disappointed she isn't able to return home immediately following hospital d/c.   Werner Lean LCSW [phone]

## 2014-03-17 NOTE — Progress Notes (Signed)
INITIAL NUTRITION ASSESSMENT  DOCUMENTATION CODES Per approved criteria  -Obesity Unspecified   INTERVENTION: - Ensure Complete BID - Attempted to encourage increased meal intake, however pt falling back asleep  - RD to continue to monitor   NUTRITION DIAGNOSIS: Increased nutrient needs related to frequent falls as evidenced by MD notes.   Goal: Pt to consume >90% of meals/supplements  Monitor:  Weights, labs, intake  Reason for Assessment: Consult for assessment   80 y.o. female  Admitting Dx: UTI (lower urinary tract infection)  ASSESSMENT: Pt with history of hypothyroidism, chronic kidney disease stage III, and hypertension. Patient has recently reported feeling generalized weakness. She reports not eating all that well with him last week. She denies any dysuria or increased frequency. Family and patient reports that she has been weak and has fallen within the last week. Reportedly patient fell again today and as such family decided to bring patient to the emergency department for further evaluation. While in the ED patient was found to have a urinary tract infection.   - Pt alone in room - Said she usually eats 2 meals/day of food that her daughter-in-law provides or things like sandwiches - Drinks 1-2 Ensure/day - Denies any changes in weight - PO intake since admission variable between 0-90% of meals - Pt denies any problems chewing or swallowing   Height: Ht Readings from Last 1 Encounters:  03/15/14  (1.651 m)    Weight: Wt Readings from Last 1 Encounters:  03/15/14 205 lb 14.6 oz (93.4 kg)    Ideal Body Weight: 125 lbs  % Ideal Body Weight: 164%  Wt Readings from Last 10 Encounters:  03/15/14 205 lb 14.6 oz (93.4 kg)    Usual Body Weight: 205 lbs per pt  % Usual Body Weight: 100%  BMI:  Body mass index is 34.27 kg/(m^2). Class I obesity  Estimated Nutritional Needs: Kcal: 1450-1650 Protein: 70-90g Fluid: per MD  Skin: +3 RLE, LLE edema    Diet Order: Cardiac  EDUCATION NEEDS: -No education needs identified at this time   Intake/Output Summary (Last 24 hours) at 03/17/14 1023 Last data filed at 03/17/14 0520  Gross per 24 hour  Intake   1290 ml  Output      0 ml  Net   1290 ml    Last BM: 8/21  Labs:   Recent Labs Lab 03/15/14 1206 03/16/14 0450  NA 139 140  K 4.5 4.9  CL 101 104  CO2 21 25  BUN 30* 24*  CREATININE 1.47* 1.18*  CALCIUM 9.7 9.2  GLUCOSE 113* 108*    CBG (last 3)  No results found for this basename: GLUCAP,  in the last 72 hours  Scheduled Meds: . amLODipine  10 mg Oral Daily  . atorvastatin  10 mg Oral q1800  . carvedilol  25 mg Oral BID WC  . cholecalciferol  1,000 Units Oral Daily  . fluconazole (DIFLUCAN) IV  100 mg Intravenous Q24H  . furosemide  20 mg Oral Daily  . gabapentin  300 mg Oral Daily  . heparin  5,000 Units Subcutaneous 3 times per day  . levothyroxine  100 mcg Oral QAC breakfast  . levothyroxine  88 mcg Oral QAC breakfast  . vitamin B-12  1,000 mcg Oral Daily    Continuous Infusions:   Past Medical History  Diagnosis Date  . Hypertension   . Hyperlipidemia   . Bilateral leg edema     Past Surgical History  Procedure Laterality Date  .  Ankle fracture surgery    . Cholecystectomy      Charlott Rakes MS, RD, LDN [phone] Pager [phone] Weekend/After Hours Pager

## 2014-03-18 MED ORDER — ENSURE COMPLETE PO LIQD: 237.0000 mL | Freq: Two times a day (BID) | ORAL | Status: DC

## 2014-03-18 MED ORDER — TRAMADOL HCL 50 MG PO TABS: 50.0000 mg | ORAL_TABLET | Freq: Two times a day (BID) | ORAL | Status: DC | PRN

## 2014-03-18 MED ORDER — FLUCONAZOLE 100 MG PO TABS: 100.0000 mg | ORAL_TABLET | Freq: Every day | ORAL | Status: DC

## 2014-03-18 NOTE — Progress Notes (Signed)
Pt has ST Rehab bed at [facility] when stable for d/c. BCBS Medicare has provided authorization for placement. FL2 in chart for MD signature.  Cori Razor LCSW [phone]

## 2014-03-18 NOTE — Progress Notes (Signed)
Clinical Social Work Department CLINICAL SOCIAL WORK PLACEMENT NOTE 03/18/2014  Patient:  Kristine [name], Kristine [name]  Account Number:  [id number] Admit date:  03/15/2014  Clinical Social Worker:  Cori Razor, LCSW  Date/time:  03/17/2014 01:32 PM  Clinical Social Work is seeking post-discharge placement for this patient at the following level of care:   SKILLED NURSING   (*CSW will update this form in Epic as items are completed)   03/17/2014  Patient/family provided with [facility] Department of Clinical Social Work's list of facilities offering this level of care within the geographic area requested by the patient (or if unable, by the patient's family).  03/17/2014  Patient/family informed of their freedom to choose among providers that offer the needed level of care, that participate in Medicare, Medicaid or managed care program needed by the patient, have an available bed and are willing to accept the patient.    Patient/family informed of [facility]' ownership interest in [facility], as well as of the fact that they are under no obligation to receive care at this facility.  PASARR submitted to EDS on 03/17/2014 PASARR number received on 03/17/2014  FL2 transmitted to all facilities in geographic area requested by pt/family on  03/17/2014 FL2 transmitted to all facilities within larger geographic area on   Patient informed that his/her managed care company has contracts with or will negotiate with  certain facilities, including the following:     Patient/family informed of bed offers received:  03/17/2014 Patient chooses bed at [facility] Physician recommends and patient chooses bed at    Patient to be transferred to [facility] on  03/18/2014 Patient to be transferred to facility by [facility] Patient and family notified of transfer on 03/18/2014 Name of family member notified:  Daughters  The following physician request were  entered in Epic:   Additional Comments: Pt / family are in agreement with d/c to SNF today via [facility] transport. BCBS Medicare provided authorization for SNF and ambulance transport. Nsg reviewed d/c summary, avs, scripts. Scripts are included in d/c packet.  Cori Razor LCSW [phone]

## 2014-03-18 NOTE — Progress Notes (Signed)
Physical Therapy Treatment Patient Details Name: Kristine [name] MRN: [id number] DOB: 06-10-1924 Today's Date: 03/18/2014    History of Present Illness 80 yo female admitted 03/15/14 Family and patient reports that she has been weak and has fallen within the last week. Reportedly patient fell again today and as such family decided to bring patient to the emergency department for further evaluation. While in the ED patient was found to have a urinary tract infection    PT Comments    Asssited pt OOB to [facility] then to recliner.  Still requires + 2 asssit and very fearful of falling.  Follow Up Recommendations  SNF     Equipment Recommendations       Recommendations for Other Services       Precautions / Restrictions Precautions Precautions: Fall Precaution Comments: incontinenece Restrictions Weight Bearing Restrictions: No    Mobility  Bed Mobility Overal bed mobility: Needs Assistance;+2 for physical assistance;+ 2 for safety/equipment Bed Mobility: Supine to Sit     Supine to sit: +2 for physical assistance;+2 for safety/equipment;HOB elevated;Total assist     General bed mobility comments: use of bed pad to slide patient around to edge plus increased time.  Transfers Overall transfer level: Needs assistance Equipment used: Rolling walker (2 wheeled) Transfers: Sit to/from Stand Sit to Stand: Max assist;+2 physical assistance;+2 safety/equipment;From elevated surface         General transfer comment: Max VC's to decrease anxiety/fear.  required several attempts and assisted off high bed to [facility] then off BSC to recliner.    Ambulation/Gait         Gait velocity: Unable to attempt due to low pt performance during transfers       Stairs            Wheelchair Mobility    Modified Rankin (Stroke Patients Only)       Balance                                    Cognition                            Exercises      General  Comments        Pertinent Vitals/Pain      Home Living                      Prior Function            PT Goals (current goals can now be found in the care plan section) Progress towards PT goals: Progressing toward goals    Frequency  Min 3X/week    PT Plan      Co-evaluation             End of Session Equipment Utilized During Treatment: Gait belt Activity Tolerance: Patient limited by pain;Other (comment) Patient left: in chair;with call bell/phone within reach     Time: 0906-0932 PT Time Calculation (min): 26 min  Charges:  $Therapeutic Activity: 23-37 mins                    G Codes:      Kristine [name]  PTA [facility]  Acute  Rehab Pager      [phone]

## 2014-03-18 NOTE — Discharge Summary (Signed)
Physician Discharge Summary  Kristine [name]:[id number] DOB: [birth year] DOA: 03/15/2014  PCP: No primary provider on file.  Admit date: 03/15/2014 Discharge date: 03/18/2014  Time spent: > 35 minutes  Recommendations for Outpatient Follow-up:  1. Please be sure to f/u with your primary care physician  Discharge Diagnoses:  Principal Problem:   UTI (lower urinary tract infection) Active Problems:   HYPOTHYROIDISM NOS   HYPERTENSION, BENIGN ESSENTIAL   KIDNEY DISEASE, CHRONIC, STAGE III   Discharge Condition: stable  Diet recommendation: Heart healthy  Filed Weights   03/15/14 2123  Weight: 93.4 kg (205 lb 14.6 oz)    History of present illness:  From original HPI: With history of hypothyroidism, chronic kidney disease stage III, and hypertension. Patient has recently reported feeling generalized weakness. She reports not eating all that well with him last week. She denies any dysuria or increased frequency. Family and patient reports that she has been weak and has fallen within the last week.   Hospital Course:  Principal Problem:  UTI (lower urinary tract infection)  -Growing yeast  -Pt on diflucan. Has had 2 days of treatment.  Will treat for 5 more days to complete a total of 7 days of tx.  Active Problems:  HYPOTHYROIDISM NOS  - Stable continue home regimen, Suspected pt may not have been taking medication as directed at home. Would recommend rechecking TSH levels within 6 wks.  HYPERTENSION, BENIGN ESSENTIAL  - Continue home regimen on discharge. Stable.  KIDNEY DISEASE, CHRONIC, STAGE III  -at baseline, continue home regimen.   Procedures:  None  Consultations:  None  Discharge Exam: Filed Vitals:   03/18/14 1400  BP: 125/49  Pulse: 68  Temp: 97.9 F (36.6 C)  Resp: 16    General: pt in nad, alert and awake Cardiovascular: rrr, no mrg Respiratory: cta bl, no wheezes  Discharge Instructions You were cared for by a hospitalist during your  hospital stay. If you have any questions about your discharge medications or the care you received while you were in the hospital after you are discharged, you can call the unit and asked to speak with the hospitalist on call if the hospitalist that took care of you is not available. Once you are discharged, your primary care physician will handle any further medical issues. Please note that NO REFILLS for any discharge medications will be authorized once you are discharged, as it is imperative that you return to your primary care physician (or establish a relationship with a primary care physician if you do not have one) for your aftercare needs so that they can reassess your need for medications and monitor your lab values.     Medication List    ASK your doctor about these medications       acetaminophen 500 MG tablet  Commonly known as:  TYLENOL  Take 500 mg by mouth every 6 (six) hours as needed for mild pain.     amLODipine 5 MG tablet  Commonly known as:  NORVASC  Take 5 mg by mouth daily.     carvedilol 25 MG tablet  Commonly known as:  COREG  Take 25 mg by mouth 2 (two) times daily with a meal.     cholecalciferol 1000 UNITS tablet  Commonly known as:  VITAMIN D  Take 1,000 Units by mouth daily.     furosemide 20 MG tablet  Commonly known as:  LASIX  Take 20 mg by mouth daily as needed.  gabapentin 300 MG capsule  Commonly known as:  NEURONTIN  Take 300 mg by mouth daily.     irbesartan 300 MG tablet  Commonly known as:  AVAPRO  Take 300 mg by mouth daily.     levothyroxine 100 MCG tablet  Commonly known as:  SYNTHROID, LEVOTHROID  Take 100 mcg by mouth daily before breakfast.     levothyroxine 88 MCG tablet  Commonly known as:  SYNTHROID, LEVOTHROID  Take 88 mcg by mouth daily before breakfast. With 100  Mcg + 188 mcg     nystatin cream  Commonly known as:  MYCOSTATIN  Apply 1 application topically 2 (two) times daily.     rosuvastatin 5 MG tablet   Commonly known as:  CRESTOR  Take 5 mg by mouth daily.     traMADol 50 MG tablet  Commonly known as:  ULTRAM  Take 50 mg by mouth every 12 (twelve) hours as needed for moderate pain.     vitamin B-12 1000 MCG tablet  Commonly known as:  CYANOCOBALAMIN  Take 1,000 mcg by mouth daily.       Allergies  Allergen Reactions  . Amitriptyline     Weak sluggish  . Buspar [Buspirone]     Increased anxiety   . Codeine     Lethargic gitter   . Dyazide [Hydrochlorothiazide W-Triamterene]     weakness  . Macrolides And Ketolides     Rash       The results of significant diagnostics from this hospitalization (including imaging, microbiology, ancillary and laboratory) are listed below for reference.    Significant Diagnostic Studies: Dg Chest 2 View  03/15/2014   CLINICAL DATA:  Recurrent fall.  EXAM: CHEST  2 VIEW  COMPARISON:  12/19/2005  FINDINGS: Mild cardiac enlargement. Calcified atherosclerotic disease involves the thoracic aorta. No pleural effusion or interstitial edema. Interstitial coarsening is identified bilaterally and is likely chronic. No airspace consolidation identified. Degenerative disc disease noted within the thoracic spine.  IMPRESSION: 1. No acute findings. 2. Chronic interstitial coarsening 3. Mild cardiac enlargement. 4. Atherosclerotic disease.   Electronically Signed   By: Signa Kell M.D.   On: 03/15/2014 13:18   Dg Thoracic Spine 2 View  03/15/2014   CLINICAL DATA:  Fall.  Back pain  EXAM: THORACIC SPINE - 2 VIEW  COMPARISON:  Chest two-view 05 of 2028 2007  FINDINGS: Progressive disc degeneration and osteophyte formation in the mid and lower thoracic spine since 2007. Apical pleural scarring bilaterally  Mild loss of vertebral height approximately T10 and T11. These are consistent with fracture most likely chronic however recent fracture not excluded. No other fracture.  IMPRESSION: Mild compression fracture approximately T10 and T11 likely chronic however  if the patient has pain in this area, MRI may be helpful to evaluate for bone marrow edema.   Electronically Signed   By: Marlan Palau M.D.   On: 03/15/2014 13:12   Dg Lumbar Spine Complete  03/15/2014   CLINICAL DATA:  Recurrent falls. Upper back pain. Evaluate for infection or injury.  EXAM: LUMBAR SPINE - COMPLETE 4+ VIEW  COMPARISON:  Lumbar MRI and radiographs 05/29/2006.  FINDINGS: The bones are diffusely demineralized. The alignment is stable and near anatomic. There is a mild scoliosis. There is multilevel spondylosis with disc space loss and paraspinal osteophytes. There is probable autofusion anteriorly across the L1-2 disc space. There is no evidence of acute fracture or paraspinal abnormality. There is no evidence of endplate destruction or discitis. Atherosclerosis  of the aorta is noted.  IMPRESSION: Progressive spondylosis since 2007. No acute osseous findings or radiographic signs of infection.   Electronically Signed   By: Roxy Horseman M.D.   On: 03/15/2014 13:12   Dg Pelvis 1-2 Views  03/15/2014   CLINICAL DATA:  Recurrent falls.  Evaluate for infection or injury.  EXAM: PELVIS - 1-2 VIEW  COMPARISON:  None.  FINDINGS: The bones are demineralized. There is no evidence of acute fracture or dislocation. There are mild degenerative changes of both hips and sacroiliac joints. No focal soft tissue abnormalities are identified.  IMPRESSION: Osteopenia without demonstrated acute osseous findings or radiographic evidence of infection.  If the patient has hip pain or inability to bear weight, follow up imaging may be warranted as hip fractures can be radiographically occult in the elderly.   Electronically Signed   By: Roxy Horseman M.D.   On: 03/15/2014 13:14   Ct Head Wo Contrast  03/15/2014   CLINICAL DATA:  Falling x3 days  EXAM: CT HEAD WITHOUT CONTRAST  TECHNIQUE: Contiguous axial images were obtained from the base of the skull through the vertex without intravenous contrast.  COMPARISON:   None.  FINDINGS: There is prominence of the sulci and ventricles compatible with brain atrophy. Low-attenuation structure within the left basal ganglia compatible with either chronic lacunar infarct or dilated perivascular space. Patchy low attenuation within the subcortical and periventricular white matter is noted compatible with chronic small vessel ischemic disease. Encephalomalacia within the right cerebellar hemisphere is noted, image 6/series 2. There is no evidence for acute infarct, hemorrhage or mass. The paranasal sinuses are clear. The mastoid air cells are clear. The skull is intact.  IMPRESSION: 1. No acute intracranial abnormalities. 2. Chronic small vessel ischemic disease and brain atrophy. 3. Chronic right cerebellar infarct.   Electronically Signed   By: Signa Kell M.D.   On: 03/15/2014 13:21    Microbiology: Recent Results (from the past 240 hour(s))  URINE CULTURE     Status: None   Collection Time    03/15/14 12:06 PM      Result Value Ref Range Status   Specimen Description URINE, CATHETERIZED   Final   Special Requests NONE   Final   Culture  Setup Time     Final   Value: 03/15/2014 18:10     Performed at [facility] Count     Final   Value: 50,000 COLONIES/ML     Performed at [facility]   Culture     Final   Value: YEAST     Performed at [facility]   Report Status 03/16/2014 FINAL   Final     Labs: Basic Metabolic Panel:  Recent Labs Lab 03/15/14 1206 03/16/14 0450  NA 139 140  K 4.5 4.9  CL 101 104  CO2 21 25  GLUCOSE 113* 108*  BUN 30* 24*  CREATININE 1.47* 1.18*  CALCIUM 9.7 9.2   Liver Function Tests:  Recent Labs Lab 03/15/14 1206  AST 20  ALT 10  ALKPHOS 76  BILITOT 1.0  PROT 7.1  ALBUMIN 3.6   No results found for this basename: LIPASE, AMYLASE,  in the last 168 hours No results found for this basename: AMMONIA,  in the last 168 hours CBC:  Recent Labs Lab 03/15/14 1206 03/16/14 0450   WBC 10.5 7.7  NEUTROABS 7.7  --   HGB 13.4 12.2  HCT 39.4 36.1  MCV 101.8* 102.6*  PLT 186 193   Cardiac Enzymes:  Recent Labs Lab 03/15/14 1206 03/15/14 1532  CKTOTAL  --  207*  TROPONINI <0.30  --    BNP: BNP (last 3 results)  Recent Labs  03/15/14 1206  PROBNP 1053.0*   CBG: No results found for this basename: GLUCAP,  in the last 168 hours     Signed:  Penny Pia  [facility] 03/18/2014, 2:35 PM

## 2014-03-19 ENCOUNTER — Encounter: Payer: Self-pay | Admitting: Internal Medicine

## 2014-03-19 ENCOUNTER — Non-Acute Institutional Stay (SKILLED_NURSING_FACILITY): Payer: Medicare Other | Admitting: Internal Medicine

## 2014-03-19 DIAGNOSIS — N183 Chronic kidney disease, stage 3 unspecified: Secondary | ICD-10-CM

## 2014-03-19 DIAGNOSIS — I10 Essential (primary) hypertension: Secondary | ICD-10-CM

## 2014-03-19 DIAGNOSIS — N390 Urinary tract infection, site not specified: Secondary | ICD-10-CM

## 2014-03-19 DIAGNOSIS — E039 Hypothyroidism, unspecified: Secondary | ICD-10-CM

## 2014-03-19 NOTE — Assessment & Plan Note (Signed)
At baseline 

## 2014-03-19 NOTE — Progress Notes (Signed)
MRN: [id number] Name: Kristine [name]  Sex: female Age: 80 y.o. DOB: 1924-06-04  [facility] #: [facility] Facility/Room: 108 Level Of Care: SNF Provider: Merrilee Seashore D Emergency Contacts: Extended Emergency Contact Information Primary Emergency Contact: Wentworth,NANCY Address: [address]          [place]  [address] Home Phone: [phone] Relation: None  Code Status: FULL  Allergies: Amitriptyline; Buspar; Codeine; Dyazide; and Macrolides and ketolides  Chief Complaint  Patient presents with  . nursing home admission    HPI: Patient is 80 y.o. female who is admitted to SNF s/p UTI.Marland Kitchen  Past Medical History  Diagnosis Date  . Hypertension   . Hyperlipidemia   . Bilateral leg edema     Past Surgical History  Procedure Laterality Date  . Ankle fracture surgery    . Cholecystectomy        Medication List       This list is accurate as of: 03/19/14  6:35 PM.  Always use your most recent med list.               acetaminophen 500 MG tablet  Commonly known as:  TYLENOL  Take 500 mg by mouth every 6 (six) hours as needed for mild pain.     amLODipine 5 MG tablet  Commonly known as:  NORVASC  Take 5 mg by mouth daily.     carvedilol 25 MG tablet  Commonly known as:  COREG  Take 25 mg by mouth 2 (two) times daily with a meal.     cholecalciferol 1000 UNITS tablet  Commonly known as:  VITAMIN D  Take 1,000 Units by mouth daily.     feeding supplement (ENSURE COMPLETE) Liqd  Take 237 mLs by mouth 2 (two) times daily between meals.     fluconazole 100 MG tablet  Commonly known as:  DIFLUCAN  Take 1 tablet (100 mg total) by mouth daily.     furosemide 20 MG tablet  Commonly known as:  LASIX  Take 20 mg by mouth daily as needed.     gabapentin 300 MG capsule  Commonly known as:  NEURONTIN  Take 300 mg by mouth daily.     irbesartan 300 MG tablet  Commonly known as:  AVAPRO  Take 300 mg by mouth daily.     levothyroxine 100 MCG tablet  Commonly known as:   SYNTHROID, LEVOTHROID  Take 100 mcg by mouth daily before breakfast.     levothyroxine 88 MCG tablet  Commonly known as:  SYNTHROID, LEVOTHROID  Take 88 mcg by mouth daily before breakfast. With 100  Mcg + 188 mcg     nystatin cream  Commonly known as:  MYCOSTATIN  Apply 1 application topically 2 (two) times daily.     rosuvastatin 5 MG tablet  Commonly known as:  CRESTOR  Take 5 mg by mouth daily.     traMADol 50 MG tablet  Commonly known as:  ULTRAM  Take 1 tablet (50 mg total) by mouth every 12 (twelve) hours as needed for moderate pain.     vitamin B-12 1000 MCG tablet  Commonly known as:  CYANOCOBALAMIN  Take 1,000 mcg by mouth daily.        No orders of the defined types were placed in this encounter.     There is no immunization history on file for this patient.  History  Substance Use Topics  . Smoking status: Never Smoker   . Smokeless tobacco: Not on file  .  Alcohol Use: No    Family history is noncontributory    Review of Systems  DATA OBTAINED: from patient GENERAL: Feels well no fevers, fatigue, appetite changes SKIN: No itching, rash  EYES: No eye pain, redness, discharge EARS: No earache, tinnitus, change in hearing NOSE: No congestion, drainage or bleeding  MOUTH/THROAT: No mouth or tooth pain  RESPIRATORY: No cough, wheezing, SOB CARDIAC: No chest pain, palpitations, lower extremity edema  GI: No abdominal pain, No N/V/D or constipation, No heartburn or reflux  GU: No dysuria, frequency or urgency, or incontinence  MUSCULOSKELETAL: No unrelieved bone/joint pain NEUROLOGIC: No headache, dizziness or focal weakness PSYCHIATRIC: No overt anxiety or sadness. Sleeps well. No behavior issue.   Filed Vitals:   03/19/14 1100  BP: 142/70  Pulse: 74  Temp: 98.1 F (36.7 C)  Resp: 18    Physical Exam  GENERAL APPEARANCE: Alert, conversant. Appropriately groomed. No acute distress.  SKIN: No diaphoresis rash, or wounds HEAD: Normocephalic,  atraumatic  EYES: Conjunctiva/lids clear. Pupils round, reactive. EOMs intact.  EARS: External exam WNL, canals clear. Hearing grossly normal.  NOSE: No deformity or discharge.  MOUTH/THROAT: Lips w/o lesions  RESPIRATORY: Breathing is even, unlabored. Lung sounds are clear   CARDIOVASCULAR: Heart RRR no murmurs, rubs or gallops. Trace peripheral edema.   GASTROINTESTINAL: Abdomen is soft, non-tender, not distended w/ normal bowel sounds.  GENITOURINARY: Bladder non tender, not distended  MUSCULOSKELETAL: No abnormal joints or musculature NEUROLOGIC: Oriented X3. Cranial nerves 2-12 grossly intact. Moves all extremities no tremor. PSYCHIATRIC: Mood and affect appropriate to situation, no behavioral issues  Patient Active Problem List   Diagnosis Date Noted  . UTI (lower urinary tract infection) 03/15/2014  . HYPOTHYROIDISM NOS 02/23/2007  . ANEMIA, SECONDARY TO ACUTE BLOOD LOSS 02/23/2007  . ANXIETY STATE NOS 02/23/2007  . HYPERTENSION, BENIGN ESSENTIAL 02/23/2007  . DYSRHYTHMIA, CARDIAC NOS 02/23/2007  . KIDNEY DISEASE, CHRONIC, STAGE III 02/23/2007  . FX CLOSED ANKLE NOS 02/23/2007    CBC    Component Value Date/Time   WBC 7.7 03/16/2014 0450   RBC 3.52* 03/16/2014 0450   HGB 12.2 03/16/2014 0450   HCT 36.1 03/16/2014 0450   PLT 193 03/16/2014 0450   MCV 102.6* 03/16/2014 0450   LYMPHSABS 1.2 03/15/2014 1206   MONOABS 1.5* 03/15/2014 1206   EOSABS 0.1 03/15/2014 1206   BASOSABS 0.0 03/15/2014 1206    CMP     Component Value Date/Time   NA 140 03/16/2014 0450   K 4.9 03/16/2014 0450   CL 104 03/16/2014 0450   CO2 25 03/16/2014 0450   GLUCOSE 108* 03/16/2014 0450   BUN 24* 03/16/2014 0450   CREATININE 1.18* 03/16/2014 0450   CALCIUM 9.2 03/16/2014 0450   PROT 7.1 03/15/2014 1206   ALBUMIN 3.6 03/15/2014 1206   AST 20 03/15/2014 1206   ALT 10 03/15/2014 1206   ALKPHOS 76 03/15/2014 1206   BILITOT 1.0 03/15/2014 1206   GFRNONAA 40* 03/16/2014 0450   GFRAA 46* 03/16/2014 0450     Assessment and Plan  UTI (lower urinary tract infection) Growing yeast  -Pt on diflucan. Has had 2 days of treatment. Will treat for 5 more days to complete a total of 7 days of tx   HYPOTHYROIDISM NOS Stable continue home regimen, Suspected pt may not have been taking medication as directed at home. Would recommend rechecking TSH levels within 6 wks   HYPERTENSION, BENIGN ESSENTIAL Stable on 4 meds  KIDNEY DISEASE, CHRONIC, STAGE III At baseline  Hennie Duos, MD

## 2014-03-19 NOTE — Assessment & Plan Note (Signed)
Stable on 4 meds

## 2014-03-19 NOTE — Assessment & Plan Note (Signed)
Growing yeast  -Pt on diflucan. Has had 2 days of treatment. Will treat for 5 more days to complete a total of 7 days of tx

## 2014-03-19 NOTE — Assessment & Plan Note (Signed)
Stable continue home regimen, Suspected pt may not have been taking medication as directed at home. Would recommend rechecking TSH levels within 6 wks

## 2014-03-31 ENCOUNTER — Encounter (HOSPITAL_COMMUNITY): Payer: Self-pay | Admitting: Emergency Medicine

## 2014-03-31 ENCOUNTER — Emergency Department (HOSPITAL_COMMUNITY)
Admission: EM | Admit: 2014-03-31 | Discharge: 2014-04-01 | Disposition: A | Discharge status: Home / Self Care | Payer: Medicare Other | Attending: Emergency Medicine | Admitting: Emergency Medicine

## 2014-03-31 ENCOUNTER — Emergency Department (HOSPITAL_COMMUNITY): Payer: Medicare Other

## 2014-03-31 DIAGNOSIS — Z043 Encounter for examination and observation following other accident: Secondary | ICD-10-CM | POA: Insufficient documentation

## 2014-03-31 DIAGNOSIS — Z79899 Other long term (current) drug therapy: Secondary | ICD-10-CM | POA: Diagnosis not present

## 2014-03-31 DIAGNOSIS — W19XXXA Unspecified fall, initial encounter: Secondary | ICD-10-CM

## 2014-03-31 DIAGNOSIS — Z87440 Personal history of urinary (tract) infections: Secondary | ICD-10-CM | POA: Diagnosis not present

## 2014-03-31 DIAGNOSIS — R609 Edema, unspecified: Secondary | ICD-10-CM | POA: Insufficient documentation

## 2014-03-31 DIAGNOSIS — Y9389 Activity, other specified: Secondary | ICD-10-CM | POA: Diagnosis not present

## 2014-03-31 DIAGNOSIS — F29 Unspecified psychosis not due to a substance or known physiological condition: Secondary | ICD-10-CM | POA: Diagnosis not present

## 2014-03-31 DIAGNOSIS — Z8781 Personal history of (healed) traumatic fracture: Secondary | ICD-10-CM | POA: Diagnosis not present

## 2014-03-31 DIAGNOSIS — F0390 Unspecified dementia without behavioral disturbance: Secondary | ICD-10-CM | POA: Insufficient documentation

## 2014-03-31 DIAGNOSIS — I10 Essential (primary) hypertension: Secondary | ICD-10-CM | POA: Insufficient documentation

## 2014-03-31 DIAGNOSIS — R296 Repeated falls: Secondary | ICD-10-CM | POA: Diagnosis not present

## 2014-03-31 DIAGNOSIS — E785 Hyperlipidemia, unspecified: Secondary | ICD-10-CM | POA: Diagnosis not present

## 2014-03-31 DIAGNOSIS — Y9210 Unspecified residential institution as the place of occurrence of the external cause: Secondary | ICD-10-CM | POA: Insufficient documentation

## 2014-03-31 DIAGNOSIS — Y92129 Unspecified place in nursing home as the place of occurrence of the external cause: Secondary | ICD-10-CM

## 2014-03-31 LAB — COMPREHENSIVE METABOLIC PANEL
ALBUMIN: 2.8 g/dL — AB (ref 3.5–5.2)
ALK PHOS: 153 U/L — AB (ref 39–117)
ALT: 22 U/L (ref 0–35)
AST: 21 U/L (ref 0–37)
Anion gap: 13 (ref 5–15)
BUN: 18 mg/dL (ref 6–23)
CALCIUM: 9.8 mg/dL (ref 8.4–10.5)
CO2: 27 mEq/L (ref 19–32)
Chloride: 94 mEq/L — ABNORMAL LOW (ref 96–112)
Creatinine, Ser: 1.01 mg/dL (ref 0.50–1.10)
GFR calc non Af Amer: 48 mL/min — ABNORMAL LOW (ref >90)
GFR, EST AFRICAN AMERICAN: 56 mL/min — AB (ref >90)
GLUCOSE: 125 mg/dL — AB (ref 70–99)
POTASSIUM: 4.5 meq/L (ref 3.7–5.3)
SODIUM: 134 meq/L — AB (ref 137–147)
TOTAL PROTEIN: 6.8 g/dL (ref 6.0–8.3)
Total Bilirubin: 0.7 mg/dL (ref 0.3–1.2)

## 2014-03-31 LAB — TROPONIN I: Troponin I: <0.3 ng/mL (ref <0.30)

## 2014-03-31 LAB — CBC WITH DIFFERENTIAL/PLATELET
BASOS PCT: 0 % (ref 0–1)
Basophils Absolute: 0 10*3/uL (ref 0.0–0.1)
EOS ABS: 0.2 10*3/uL (ref 0.0–0.7)
EOS PCT: 2 % (ref 0–5)
HCT: 37.1 % (ref 36.0–46.0)
Hemoglobin: 12.6 g/dL (ref 12.0–15.0)
Lymphocytes Relative: 9 % — ABNORMAL LOW (ref 12–46)
Lymphs Abs: 0.9 10*3/uL (ref 0.7–4.0)
MCH: 34.1 pg — AB (ref 26.0–34.0)
MCHC: 34 g/dL (ref 30.0–36.0)
MCV: 100.3 fL — AB (ref 78.0–100.0)
Monocytes Absolute: 1.7 10*3/uL — ABNORMAL HIGH (ref 0.1–1.0)
Monocytes Relative: 17 % — ABNORMAL HIGH (ref 3–12)
Neutro Abs: 7.6 10*3/uL (ref 1.7–7.7)
Neutrophils Relative %: 72 % (ref 43–77)
PLATELETS: 428 10*3/uL — AB (ref 150–400)
RBC: 3.7 MIL/uL — ABNORMAL LOW (ref 3.87–5.11)
RDW: 13.2 % (ref 11.5–15.5)
WBC: 10.5 10*3/uL (ref 4.0–10.5)

## 2014-03-31 MED ORDER — SODIUM CHLORIDE 0.9 % IV BOLUS (SEPSIS)
1000.0000 mL | Freq: Once | INTRAVENOUS | Status: AC
  Administered 2014-04-01: 1000 mL via INTRAVENOUS

## 2014-03-31 NOTE — ED Notes (Signed)
Per EMS: Pt from nursing home, has been a resident there for about 2 wks, has fractured back from about 2 wks ago. Staff came in to check on pt and she was sitting on floor with back to bed. Pt very vague when asked about "fall," doesn't c/o any pain or injuries from fall. A&O x 4.

## 2014-03-31 NOTE — ED Notes (Signed)
In & Out attempted, was unsuccessful. Will reattempt.

## 2014-03-31 NOTE — ED Notes (Signed)
Bed: WA06 Expected date:  Expected time:  Means of arrival:  Comments: EMS 80 yo from SNF s/p fall

## 2014-03-31 NOTE — ED Notes (Signed)
Second In and Out was also unsuccessful, PA was at bedside and is aware.

## 2014-03-31 NOTE — ED Provider Notes (Signed)
CSN: [id number]     Arrival date & time 03/31/14  2048 History   First MD Initiated Contact with Patient 03/31/14 2100     Chief Complaint  Patient presents with  . Fall     (Consider location/radiation/quality/duration/timing/severity/associated sxs/prior Treatment) HPI  Kristine [name] is a 80 y.o. female part in by EMS for evaluation of fall at nursing home. She was in bed and when nursing home staff went back to check on her she was sitting next to the bed on the floor. Patient is severely demented. Level V caveat. She is accompanied by her daughter-in-law who supplies most of the history. States she's recently been treated for UTI. States that she had fracture of her tach it was discovered that her last ED visit. According to the daughter-in-law she's been in any her normal state of health, eating and drinking well.  Past Medical History  Diagnosis Date  . Hypertension   . Hyperlipidemia   . Bilateral leg edema    Past Surgical History  Procedure Laterality Date  . Ankle fracture surgery    . Cholecystectomy     No family history on file. History  Substance Use Topics  . Smoking status: Never Smoker   . Smokeless tobacco: Not on file  . Alcohol Use: No   OB History   Grav Para Term Preterm Abortions TAB SAB Ect Mult Living                 Review of Systems  10 systems reviewed and found to be negative, except as noted in the HPI.   Allergies  Amitriptyline; Buspar; Codeine; Dyazide; and Macrolides and ketolides  Home Medications   Prior to Admission medications   Medication Sig Start Date End Date Taking? Authorizing Provider  acetaminophen (TYLENOL) 500 MG tablet Take 500 mg by mouth every 6 (six) hours as needed for mild pain.   Yes Historical Provider, MD  amLODipine (NORVASC) 2.5 MG tablet Take 2.5 mg by mouth daily.   Yes Historical Provider, MD  amLODipine (NORVASC) 5 MG tablet Take 5 mg by mouth daily.   Yes Historical Provider, MD  bisacodyl (DULCOLAX)  10 MG suppository Place 10 mg rectally as needed for moderate constipation.   Yes Historical Provider, MD  bisacodyl (FLEET) 10 MG/30ML ENEM Place 10 mg rectally once as needed (constipation.).   Yes Historical Provider, MD  carvedilol (COREG) 25 MG tablet Take 25 mg by mouth 2 (two) times daily with a meal.   Yes Historical Provider, MD  cholecalciferol (VITAMIN D) 1000 UNITS tablet Take 1,000 Units by mouth daily.   Yes Historical Provider, MD  feeding supplement, ENSURE COMPLETE, (ENSURE COMPLETE) LIQD Take 237 mLs by mouth 2 (two) times daily between meals. 03/18/14  Yes Penny Pia, MD  furosemide (LASIX) 20 MG tablet Take 20 mg by mouth daily as needed.   Yes Historical Provider, MD  gabapentin (NEURONTIN) 300 MG capsule Take 300 mg by mouth daily.   Yes Historical Provider, MD  irbesartan (AVAPRO) 300 MG tablet Take 300 mg by mouth daily.   Yes Historical Provider, MD  levothyroxine (SYNTHROID, LEVOTHROID) 100 MCG tablet Take 100 mcg by mouth daily before breakfast.   Yes Historical Provider, MD  levothyroxine (SYNTHROID, LEVOTHROID) 88 MCG tablet Take 88 mcg by mouth daily before breakfast. With 100  Mcg + 188 mcg   Yes Historical Provider, MD  magnesium hydroxide (MILK OF MAGNESIA) 400 MG/5ML suspension Take 30 mLs by mouth daily as needed for  mild constipation.   Yes Historical Provider, MD  rosuvastatin (CRESTOR) 5 MG tablet Take 5 mg by mouth daily.   Yes Historical Provider, MD  traMADol (ULTRAM) 50 MG tablet Take 1 tablet (50 mg total) by mouth every 12 (twelve) hours as needed for moderate pain. 03/18/14  Yes Penny Pia, MD  vitamin B-12 (CYANOCOBALAMIN) 1000 MCG tablet Take 1,000 mcg by mouth daily.   Yes Historical Provider, MD   BP 149/78  Pulse 97  Temp(Src) 98.6 F (37 C) (Oral)  Resp 16  SpO2 98% Physical Exam  Nursing note and vitals reviewed. Constitutional: She appears well-developed and well-nourished. No distress.  HENT:  Head: Normocephalic and atraumatic.   Mouth/Throat: Oropharynx is clear and moist.  No abrasions or contusions.   No hemotympanum, battle signs or raccoon's eyes  No crepitance or tenderness to palpation along the orbital rim.  EOMI intact with no pain or diplopia  No abnormal otorrhea or rhinorrhea. Nasal septum midline.  No intraoral trauma.      Eyes: Conjunctivae and EOM are normal. Pupils are equal, round, and reactive to light.  Pupils reactive but she has anisocoria and the right pupil is 3 mm left pupil is 5  Neck: Normal range of motion. Neck supple.  Cardiovascular: Normal rate, regular rhythm and intact distal pulses.   Pulmonary/Chest: Effort normal and breath sounds normal. No stridor. No respiratory distress. She has no wheezes. She has no rales. She exhibits no tenderness.  Abdominal: Soft. Bowel sounds are normal. She exhibits no distension and no mass. There is no tenderness. There is no rebound and no guarding.  Musculoskeletal: Normal range of motion. She exhibits edema.  2+ bilateral pitting edema to midshin.  Patient cannot lists either leg up off the stretcher. As per daughter-in-law this is a chronic condition. She cannot explain why lower extremities are so weak.  Neurological: She is alert.  Confused, speaking in sentences  Psychiatric: She has a normal mood and affect.    ED Course  Procedures (including critical care time) Labs Review Labs Reviewed  URINALYSIS, ROUTINE W REFLEX MICROSCOPIC - Abnormal; Notable for the following:    Hgb urine dipstick TRACE (*)    All other components within normal limits  CBC WITH DIFFERENTIAL - Abnormal; Notable for the following:    RBC 3.70 (*)    MCV 100.3 (*)    MCH 34.1 (*)    Platelets 428 (*)    Lymphocytes Relative 9 (*)    Monocytes Relative 17 (*)    Monocytes Absolute 1.7 (*)    All other components within normal limits  COMPREHENSIVE METABOLIC PANEL - Abnormal; Notable for the following:    Sodium 134 (*)    Chloride 94 (*)     Glucose, Bld 125 (*)    Albumin 2.8 (*)    Alkaline Phosphatase 153 (*)    GFR calc non Af Amer 48 (*)    GFR calc Af Amer 56 (*)    All other components within normal limits  TROPONIN I  URINE MICROSCOPIC-ADD ON    Imaging Review Dg Thoracic Spine 2 View  04/01/2014   CLINICAL DATA:  Upper and lower back pain after a fall.  EXAM: THORACIC SPINE - 2 VIEW  COMPARISON:  Chest 03/15/2014  FINDINGS: Diffuse bone demineralization. Diffuse degenerative change throughout the thoracic spine with narrowed interspaces and anterior bridging osteophytes. Normal alignment. No anterior subluxation. No vertebral compression deformities. No paraspinal soft tissue swelling.  IMPRESSION: Diffuse degenerative change  and demineralization of the thoracic spine. No acute displaced fractures identified.   Electronically Signed   By: Burman Nieves M.D.   On: 04/01/2014 00:25   Dg Lumbar Spine Complete  04/01/2014   CLINICAL DATA:  Fall, low back pain  EXAM: LUMBAR SPINE - COMPLETE 4+ VIEW  COMPARISON:  None.  FINDINGS: Five lumbar type vertebral bodies.  Normal lumbar lordosis.  Mild lumbar levoscoliosis.  No evidence of fracture dislocation. Vertebral body heights are maintained.  Moderate multilevel degenerative changes.  Visualized bony pelvis appears intact.  IMPRESSION: No fracture or dislocation is seen.  Moderate multilevel degenerative changes.   Electronically Signed   By: Charline Bills M.D.   On: 04/01/2014 00:28   Dg Pelvis 1-2 Views  04/01/2014   CLINICAL DATA:  Upper and lower back pain after a fall.  EXAM: PELVIS - 1-2 VIEW  COMPARISON:  03/15/2014  FINDINGS: Diffuse bone demineralization. Pelvis and hips appear intact. No displaced fractures are identified. Degenerative changes noted in the lower lumbar spine, both hips, an both SI joints.  IMPRESSION: Degenerative changes.  No displaced fractures identified.   Electronically Signed   By: Burman Nieves M.D.   On: 04/01/2014 00:29   Ct Head Wo  Contrast  03/31/2014   CLINICAL DATA:  Fall, confusion, early dementia  EXAM: CT HEAD WITHOUT CONTRAST  CT CERVICAL SPINE WITHOUT CONTRAST  TECHNIQUE: Multidetector CT imaging of the head and cervical spine was performed following the standard protocol without intravenous contrast. Multiplanar CT image reconstructions of the cervical spine were also generated.  COMPARISON:  CT head dated 03/15/2014  FINDINGS: CT HEAD FINDINGS  No evidence of parenchymal hemorrhage or extra-axial fluid collection. No mass lesion, mass effect, or midline shift.  No CT evidence of acute infarction.  Encephalomalacic changes related to old right inferior cerebellar infarct. Old left basal ganglia lacunar infarct.  Subcortical white matter and periventricular small vessel ischemic changes. Mild intracranial atherosclerosis.  Global cortical atrophy.  Mild secondary ventricular prominence.  The visualized paranasal sinuses are essentially clear. The mastoid air cells are unopacified.  No evidence of calvarial fracture.  CT CERVICAL SPINE FINDINGS  Cyst reversal the normal cervical lordosis.  No evidence of fracture or dislocation. Vertebral body heights are maintained. Dens appears intact.  No prevertebral soft tissue swelling.  Moderate multilevel degenerative changes with large anterior osteophytes. 4 mm anterolisthesis of C2 on C3. 2 mm anterolisthesis of C3 on C4.  Visualized thyroid is grossly unremarkable.  Visualized lung apices are essentially clear.  IMPRESSION: No evidence of acute intracranial abnormality. 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 Atrophy with small vessel ischemic changes.  No evidence of traumatic injury to the cervical spine. Moderate multilevel degenerative changes.   Electronically Signed   By: Charline Bills M.D.   On: 03/31/2014 23:38   Ct Cervical Spine Wo Contrast  03/31/2014   CLINICAL DATA:  Fall, confusion, early dementia  EXAM: CT HEAD WITHOUT CONTRAST  CT CERVICAL SPINE WITHOUT  CONTRAST  TECHNIQUE: Multidetector CT imaging of the head and cervical spine was performed following the standard protocol without intravenous contrast. Multiplanar CT image reconstructions of the cervical spine were also generated.  COMPARISON:  CT head dated 03/15/2014  FINDINGS: CT HEAD FINDINGS  No evidence of parenchymal hemorrhage or extra-axial fluid collection. No mass lesion, mass effect, or midline shift.  No CT evidence of acute infarction.  Encephalomalacic changes related to old right inferior cerebellar infarct. Old left basal ganglia lacunar infarct.  Subcortical white matter and periventricular small vessel ischemic changes. Mild intracranial atherosclerosis.  Global cortical atrophy.  Mild secondary ventricular prominence.  The visualized paranasal sinuses are essentially clear. The mastoid air cells are unopacified.  No evidence of calvarial fracture.  CT CERVICAL SPINE FINDINGS  Cyst reversal the normal cervical lordosis.  No evidence of fracture or dislocation. Vertebral body heights are maintained. Dens appears intact.  No prevertebral soft tissue swelling.  Moderate multilevel degenerative changes with large anterior osteophytes. 4 mm anterolisthesis of C2 on C3. 2 mm anterolisthesis of C3 on C4.  Visualized thyroid is grossly unremarkable.  Visualized lung apices are essentially clear.  IMPRESSION: No evidence of acute intracranial abnormality. 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 Atrophy with small vessel ischemic changes.  No evidence of traumatic injury to the cervical spine. Moderate multilevel degenerative changes.   Electronically Signed   By: Charline Bills M.D.   On: 03/31/2014 23:38   Dg Chest Port 1 View  03/31/2014   CLINICAL DATA:  Shortness of breath, back pain  EXAM: PORTABLE CHEST - 1 VIEW  COMPARISON:  03/15/2014  FINDINGS: Chronic interstitial markings. No focal consolidation. No pleural effusion or pneumothorax.  The heart is normal size.  IMPRESSION: No  evidence of acute cardiopulmonary disease.   Electronically Signed   By: Charline Bills M.D.   On: 03/31/2014 21:43     EKG Interpretation None      MDM   Final diagnoses:  Fall at nursing home, initial encounter    Filed Vitals:   04/01/14 0215 04/01/14 0320  BP: 163/63 149/78  Pulse: 92 97  Temp:  98.6 F (37 C)  TempSrc:  Oral  Resp: 18 16  SpO2: 96% 98%    Medications  sodium chloride 0.9 % bolus 1,000 mL (0 mLs Intravenous Stopped 04/01/14 0110)    Kristine [name] is a 80 y.o. female presenting for evaluation of fall at nursing home. EKG with no acute changes, left bundle-branch block as seen prior. Troponin is negative  Delay in obtaining urine: Patient has been straight cathed 3 times and no urine is obtained. There is no evidence of severe dehydration on physical exam or blood work. She will be given a liter of fluid and  Evaluation does not show pathology that would require ongoing emergent intervention or inpatient treatment. Pt is hemodynamically stable and mentating appropriately. Discussed findings and plan with patient/guardian, who agrees with care plan. All questions answered. Return precautions discussed and outpatient follow up given.     Wynetta Emery, PA-C 04/01/14 [phone]

## 2014-03-31 NOTE — ED Notes (Signed)
Pt oriented to self, place and situation. States she doesn't remember falling. Denies pain at this time. States she doesn't want to be here. Pt's daughter-in-law at bedside, states that pt brought to hospital 2 wks ago for fall and was found to have compressed disc in her back.

## 2014-04-01 DIAGNOSIS — Z043 Encounter for examination and observation following other accident: Secondary | ICD-10-CM | POA: Diagnosis not present

## 2014-04-01 LAB — URINALYSIS, ROUTINE W REFLEX MICROSCOPIC
BILIRUBIN URINE: NEGATIVE
Glucose, UA: NEGATIVE mg/dL
Ketones, ur: NEGATIVE mg/dL
Leukocytes, UA: NEGATIVE
NITRITE: NEGATIVE
Protein, ur: NEGATIVE mg/dL
SPECIFIC GRAVITY, URINE: 1.01 (ref 1.005–1.030)
Urobilinogen, UA: 1 mg/dL (ref 0.0–1.0)
pH: 7 (ref 5.0–8.0)

## 2014-04-01 LAB — URINE MICROSCOPIC-ADD ON

## 2014-04-01 NOTE — ED Notes (Signed)
[facility] called for transportation Report given to [facility] at [facility]

## 2014-04-01 NOTE — Discharge Instructions (Signed)
Do not hesitate to return to the emergency room for any new, worsening or concerning symptoms.  Please obtain primary care using resource guide below. But the minute you were seen in the emergency room and that they will need to obtain records for further outpatient management.   Fall Prevention and Home Safety Falls cause injuries and can affect all age groups. It is possible to use preventive measures to significantly decrease the likelihood of falls. There are many simple measures which can make your home safer and prevent falls. OUTDOORS  Repair cracks and edges of walkways and driveways.  Remove high doorway thresholds.  Trim shrubbery on the main path into your home.  Have good outside lighting.  Clear walkways of tools, rocks, debris, and clutter.  Check that handrails are not broken and are securely fastened. Both sides of steps should have handrails.  Have leaves, snow, and ice cleared regularly.  Use sand or salt on walkways during winter months.  In the garage, clean up grease or oil spills. BATHROOM  Install night lights.  Install grab bars by the toilet and in the tub and shower.  Use non-skid mats or decals in the tub or shower.  Place a plastic non-slip stool in the shower to sit on, if needed.  Keep floors dry and clean up all water on the floor immediately.  Remove soap buildup in the tub or shower on a regular basis.  Secure bath mats with non-slip, double-sided rug tape.  Remove throw rugs and tripping hazards from the floors. BEDROOMS  Install night lights.  Make sure a bedside light is easy to reach.  Do not use oversized bedding.  Keep a telephone by your bedside.  Have a firm chair with side arms to use for getting dressed.  Remove throw rugs and tripping hazards from the floor. KITCHEN  Keep handles on pots and pans turned toward the center of the stove. Use back burners when possible.  Clean up spills quickly and allow time for  drying.  Avoid walking on wet floors.  Avoid hot utensils and knives.  Position shelves so they are not too high or low.  Place commonly used objects within easy reach.  If necessary, use a sturdy step stool with a grab bar when reaching.  Keep electrical cables out of the way.  Do not use floor polish or wax that makes floors slippery. If you must use wax, use non-skid floor wax.  Remove throw rugs and tripping hazards from the floor. STAIRWAYS  Never leave objects on stairs.  Place handrails on both sides of stairways and use them. Fix any loose handrails. Make sure handrails on both sides of the stairways are as long as the stairs.  Check carpeting to make sure it is firmly attached along stairs. Make repairs to worn or loose carpet promptly.  Avoid placing throw rugs at the top or bottom of stairways, or properly secure the rug with carpet tape to prevent slippage. Get rid of throw rugs, if possible.  Have an electrician put in a light switch at the top and bottom of the stairs. OTHER FALL PREVENTION TIPS  Wear low-heel or rubber-soled shoes that are supportive and fit well. Wear closed toe shoes.  When using a stepladder, make sure it is fully opened and both spreaders are firmly locked. Do not climb a closed stepladder.  Add color or contrast paint or tape to grab bars and handrails in your home. Place contrasting color strips on first and  last steps.  Learn and use mobility aids as needed. Install an electrical emergency response system.  Turn on lights to avoid dark areas. Replace light bulbs that burn out immediately. Get light switches that glow.  Arrange furniture to create clear pathways. Keep furniture in the same place.  Firmly attach carpet with non-skid or double-sided tape.  Eliminate uneven floor surfaces.  Select a carpet pattern that does not visually hide the edge of steps.  Be aware of all pets. OTHER HOME SAFETY TIPS  Set the water temperature  for 120 F (48.8 C).  Keep emergency numbers on or near the telephone.  Keep smoke detectors on every level of the home and near sleeping areas. Document Released: 07/01/2002 Document Revised: 01/10/2012 Document Reviewed: 09/30/2011 [facility] Patient Information 2015 [place], [place]. This information is not intended to replace advice given to you by your health care provider. Make sure you discuss any questions you have with your health care provider.    Emergency Department Resource Guide 1) Find a Doctor and Pay Out of Pocket Although you won't have to find out who is covered by your insurance plan, it is a good idea to ask around and get recommendations. You will then need to call the office and see if the doctor you have chosen will accept you as a new patient and what types of options they offer for patients who are self-pay. Some doctors offer discounts or will set up payment plans for their patients who do not have insurance, but you will need to ask so you aren't surprised when you get to your appointment.  2) Contact Your Local Health Department Not all health departments have doctors that can see patients for sick visits, but many do, so it is worth a call to see if yours does. If you don't know where your local health department is, you can check in your phone book. The CDC also has a tool to help you locate your state's health department, and many state websites also have listings of all of their local health departments.  3) Find a Walk-in Clinic If your illness is not likely to be very severe or complicated, you may want to try a walk in clinic. These are popping up all over the country in pharmacies, drugstores, and shopping centers. They're usually staffed by nurse practitioners or physician assistants that have been trained to treat common illnesses and complaints. They're usually fairly quick and inexpensive. However, if you have serious medical issues or chronic medical  problems, these are probably not your best option.  No Primary Care Doctor: - Call Health Connect at  [phone] - they can help you locate a primary care doctor that  accepts your insurance, provides certain services, etc. - Physician Referral Service- [phone]  Chronic Pain Problems: Organization         Address  Phone   Notes  [facility]  [phone] Patients need to be referred by their primary care doctor.   Medication Assistance: Organization         Address  Phone   Notes  [facility] Medication [facility] [address] [place] [place], [place] [address] [phone] --Must be a resident of [facility] -- Must have NO insurance coverage whatsoever (no Medicaid/ Medicare, etc.) -- The pt. MUST have a primary care doctor that directs their care regularly and follows them in the community   [facility]  [phone]   [address]  [phone]  Agencies that provide inexpensive medical care: Organization         Address  Phone   Notes  [facility]  [phone]   [facility]    [phone]   [facility] [address] [place], [place] [address] [phone]   [facility] [address], [place] [phone]   Planned Parenthood    [phone]   [facility]    [phone]   Community Health and [facility]  [address], [place] Phone:  [phone], Fax:  [phone] Hours of Operation:  9 am - 6 pm, M-F.  Also accepts Medicaid/Medicare and self-pay.  [facility]  [address], [place] Phone: [phone], Fax: [phone]. Hours of Operation:  8:30 am - 5:30 pm, M-F.  Also accepts Medicaid and self-pay.  [facility] [place] [address] [place] Phone: [phone]   [facility] [address] Natasha Bence [place], [place] [phone], Ext. [phone]  Mondays & Thursdays: 7-9 AM.  First 15 patients are seen on a first come, first serve basis.    Medicaid-accepting [facility] Providers:  Organization         Address  Phone   Notes  [facility] [address], [place] [phone] Also accepts self-pay patients.  [facility] [address] Laurell Josephs [place], [place]  [phone]   [facility] [address], [place] [phone]   [facility] [address] [place] [phone]   Renaye Rakers [address], [place]   [phone] Only accepts [name] Access [place] patients after they have their name applied to their card.   Self-Pay (no insurance) in [facility]:  Organization         Address  Phone   Notes  Sickle Cell Patients, [facility] [address] [place], [place] [phone]   [facility] [address] [place], [place] [phone]   [facility] [place]  [address], [place] [phone]   [facility]/Dr. Osei-Bonsu  [address] [place] or [address], [place] [phone] Phone number for both [place] and [place] locations is the same.  Urgent Medical and [facility] [address], [place] [phone]   [facility] [address] [place] or [address] [phone]   [facility] [address] [place] [phone], phone; [phone], fax Sees patients 1st and 3rd Saturday of every month.  Must not qualify for public or private insurance (i.e. Medicaid, Medicare, [place] Health Choice, Veterans' Benefits)  Household income should be no more than 200% of the poverty level The clinic cannot treat you if you are pregnant or think you are pregnant  Sexually transmitted diseases are not treated at  the clinic.    Dental Care: Organization         Address  Phone  Notes  [facility] [address] [place], [place] [phone] Accepts children up to age 21 who are enrolled in [place] or [place] Health Choice; pregnant women with a Medicaid card; and children who have applied for Medicaid or  [place] Health Choice, but were declined, whose parents can pay a reduced fee at time of service.  [facility]  [address], [place] [phone] Accepts children up to age 56 who are enrolled in [place] or [place] Health Choice; pregnant women with a Medicaid card; and children who have applied for Medicaid or [place] Health Choice, but were declined, whose parents can pay a reduced fee at time of service.  [facility]  [address] [place], [place] [phone] Patients are seen by appointment only. Walk-ins are not accepted. [facility] will see patients 108 years of age and older. Monday - Tuesday (8am-5pm) Most Wednesdays (8:30-5pm) $30 per visit, cash only  [facility]  [address], [facility] [phone] Patients are seen by appointment only. Walk-ins are not accepted. [facility] will see patients 1 years of age and older. One Wednesday Evening (Monthly: Volunteer Based).  $30 per visit, cash only  [employer]  [phone] for adults; Children under age 36, call Graduate Pediatric Dentistry at [phone]. Children aged 79-14, please call [phone] to request a pediatric application.  Dental services are provided in all areas of dental care including fillings, crowns and bridges, complete and partial dentures, implants, gum treatment, root canals, and extractions. Preventive care is also provided. Treatment is provided to both adults and children. Patients are selected via a lottery and there is often a  waiting list.   [facility] [address] [place]  [phone] [email or web address]   [facility] [address] [place], [place] [phone], Ext. [phone] Second and Fourth Thursday of each month, opens at 6:30 AM; Clinic ends at 9 AM.  Patients are seen on a first-come first-served basis, and a limited number are seen during each clinic.   [facility]  [address] Ether Griffins [place], [place] [phone]   Eligibility Requirements You must have lived in [place], [place], or [place] counties for at least the last three months.   You cannot be eligible for state or federal sponsored [place], including [employer], [place], or [employer].   You generally cannot be eligible for healthcare insurance through your employer.    How to apply: Eligibility screenings are held every Tuesday and Wednesday afternoon from 1:00 pm until 4:00 pm. You do not need an appointment for the interview!  [facility] [address] [place], [place] [phone]   [facility] Health Department  [phone]   [facility] Health Department  [phone]   [facility] Health Department  [phone]    Behavioral Health Resources in the Community: Intensive Outpatient Programs Organization         Address  Phone  Notes  [facility] [address] [place], [place] [phone]   [facility] Outpatient [address], [place], [place] [phone]   [facility] [address] [place], [place]  [phone]   [facility] [address],  [place], [place] [phone] or [phone]   Substance Abuse Resources Organization         Address  Phone  Notes  [facility]  [phone]   [facility]  [phone]   [facility] [place]  [phone]   Floydene Flock  [phone]   Residential & Outpatient Substance Abuse  Program  [phone]  Psychological Services Organization         Address  Phone  Notes  [facility] Behavior[facility] Health  [phone]   [facility] Services  [phone]   [facility] [phone] [place] [phone] or [phone]    Mobile Crisis Teams Organization         Address  Phone  Notes  [facility], Mobile Crisis Care Unit  [phone]   [facility]  [address] [place], [place] [phone]   Doristine Locks [address] [place] [phone]    Self-Help/Support Groups Organization         Address  Phone             Notes  [facility] [place] - variety of support groups  336- I7437963 Call for more information  Narcotics Anonymous (NA), [facility] [address], [employer] [place]  2 meetings at this location   Statistician         Address  Phone  Notes  [facility] [address],    [place]  [phone]   [facility]  [address] [name] [address], [place], [place] [phone]   [facility] [address] [place], [place] [phone] Admissions: 8am-3pm M-F  [facility] [address]    [place], [place] [phone]   [facility] [address] [place] [address] [place], [place], [place] [phone]   [facility] [address],  [place], [place] [phone]   [facility] - Intensive Outpatient [address] Laurell Josephs [address], [place], [place] [phone]   [facility] ([facility] [address] [place]  [place], [place] [phone] or [phone]   [facility] ([facility]) [address], [place], [place] [phone] Accepts Medicaid  [facility] [address],  [place] [phone] Substance Abuse/Addiction Treatment   [facility] Organization          Address  Phone  Notes  [facility]  [phone]   Angie Fava, PhD [address] Ervin Knack [place], [place]   [phone] or [phone]   [facility] Behavioral   [address] [place], [place] [phone]   [facility] [address] [place], [place] [phone] Insurance/Medicaid/sponsorship through [facility] [address]                                    [place], [place] [phone] Therapy/tele-psych/case  [facility] [address], [place] [phone]    Dr. Lolly Mustache  [phone]   [facility] [place]  [employer] [facility] Dept. 1) [address] [place] 2) [address], [place] 3)  [address], [place] [phone]  [phone]   [facility] Child Abuse Hotline 573-451-2665 or [phone] (After Hours)

## 2014-04-01 NOTE — ED Provider Notes (Signed)
Medical screening examination/treatment/procedure(s) were performed by non-physician practitioner and as supervising physician I was immediately available for consultation/collaboration.   EKG Interpretation None       Clydean Posas R. Suzetta Timko, MD 04/01/14 1500 

## 2014-04-01 NOTE — ED Notes (Signed)
Third In&Out Cath attempted, no urine return. Diaper is also dry. PA aware.

## 2014-05-07 ENCOUNTER — Non-Acute Institutional Stay (SKILLED_NURSING_FACILITY): Payer: Medicare Other | Admitting: Internal Medicine

## 2014-05-07 DIAGNOSIS — R059 Cough, unspecified: Secondary | ICD-10-CM

## 2014-05-07 DIAGNOSIS — N183 Chronic kidney disease, stage 3 unspecified: Secondary | ICD-10-CM

## 2014-05-07 DIAGNOSIS — E038 Other specified hypothyroidism: Secondary | ICD-10-CM

## 2014-05-07 DIAGNOSIS — I10 Essential (primary) hypertension: Secondary | ICD-10-CM

## 2014-05-07 DIAGNOSIS — R05 Cough: Secondary | ICD-10-CM

## 2014-05-12 ENCOUNTER — Encounter: Payer: Self-pay | Admitting: Internal Medicine

## 2014-05-12 NOTE — Progress Notes (Signed)
Patient ID: Kristine [name], female   DOB: [birth year], 80 y.o.   MRN: [id number]   This is a routine visit.  Level care skilled.  Facility [facility].  Date is 05/07/2014.  Chief Complaint   P medical management of chronic medical conditions including hypertension hyperlipidemia hypothyroidism-acute visit secondary to cough   .     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  Patient is somewhat of a poor historian but does not complaining of any increased shortness of breath although the cough does irritate her.  Per chart review it appears she did go to the emergency department about a month ago after a fall workup including a CT of the head and spine was negative for any acute process    Past Medical History   Diagnosis  Date   .  Hypertension    .  Hyperlipidemia    .  Bilateral leg edema     Past Surgical History   Procedure  Laterality  Date   .  Ankle fracture surgery     .  Cholecystectomy        Medication List         .            acetaminophen 500 MG tablet    Commonly known as: TYLENOL    Take 500 mg by mouth every 6 (six) hours as needed for mild pain.    amLODipine 5 MG tablet    Commonly known as: NORVASC    Take 5 mg by mouth daily.    carvedilol 25 MG tablet    Commonly known as: COREG    Take 25 mg by mouth 2 (two) times daily with a meal.    cholecalciferol 1000 UNITS tablet    Commonly known as: VITAMIN D    Take 1,000 Units by mouth daily.    feeding supplement (ENSURE COMPLETE) Liqd    Take 237 mLs by mouth 2 (two) times daily between meals.              furosemide 20 MG tablet    Commonly known as: LASIX    Take 20 mg by mouth daily as needed.    gabapentin 300 MG capsule    Commonly known as: NEURONTIN    Take 300 mg by mouth daily.    irbesartan 300 MG tablet    Commonly known as: AVAPRO    Take 300 mg by mouth daily.    levothyroxine 100 MCG tablet    Commonly known as: SYNTHROID, LEVOTHROID    Take 100 mcg by mouth daily before breakfast.    levothyroxine 88 MCG tablet    Commonly known as: SYNTHROID, LEVOTHROID    Take 88 mcg by mouth daily before breakfast. With 100 Mcg + 188 mcg              rosuvastatin 5 MG tablet    Commonly known as: CRESTOR    Take 5 mg by mouth daily.    traMADol 50 MG tablet    Commonly known as: ULTRAM    Take 1 tablet (50 mg total) by mouth every 12 (twelve) hours as needed for moderate pain.    vitamin B-12 1000 MCG tablet    Commonly known as: CYANOCOBALAMIN    Take 1,000 mcg by mouth daily.        History   Substance Use  Topics   .  Smoking status:  Never Smoker   .  Smokeless tobacco:  Not on file   .  Alcohol Use:  No   Family history is noncontributory  Review of Systems  DATA OBTAINED: from patient  GENERAL: Feels well no fevers, fatigue, appetite changes has a cough SKIN: No itching, rash  EYES: No eye pain, redness, discharge  EARS: No earache, tinnitus, change in hearing  NOSE: No congestion, drainage or bleeding  MOUTH/THROAT: No mouth or tooth pain  RESPIRATORY: has a dry cough,  no wheezing or  SOB  CARDIAC: No chest pain, palpitations, lower extremity edema  GI: No abdominal pain, No N/V/D or constipation, No heartburn or reflux  GU: No dysuria, frequency or urgency, or incontinence  MUSCULOSKELETAL: No unrelieved bone/joint pain  NEUROLOGIC: No headache, dizziness or focal weakness  PSYCHIATRIC: No overt anxiety or sadness. Sleeps well. No behavior issue.                    Physical Exam  Temperature 98.1 pulse 70 respirations 20 blood pressure 129/62 GENERAL APPEARANCE: Alert, conversant. Appropriately groomed. No acute distress. Lying comfortably in bed SKIN: No diaphoresis rash, or wounds  HEAD: Normocephalic, atraumatic  EYES: Conjunctiva/lids clear. Pupils round, reactive. EOMs intact.  EARS: External exam WNL, canals clear.  Hearing grossly normal.  NOSE: No deformity or discharge.  MOUTH/THROAT: Lips w/o lesions  RESPIRATORY: Breathing is even, unlabored. Lung sounds are clear  CARDIOVASCULAR: Heart RRR no murmurs, rubs or gallops. Trace peripheral edema.  GASTROINTESTINAL: Abdomen is soft, non-tender, not distended w/ normal bowel sounds.  GENITOURINARY: Bladder non tender, not distended  MUSCULOSKELETAL: No abnormal joints or musculature--has some lower extremity weakness I suspect this is baseline  NEUROLOGIC: Oriented X3. Cranial nerves 2-12 grossly intact. Moves all extremities no tremor.  PSYCHIATRIC: Mood and affect appropriate to situation, no behavioral issues-- oriented to self-- -pleasant smiling  Patient Active Problem List    Diagnosis  Date Noted   .  UTI (lower urinary tract infection)  03/15/2014   .  HYPOTHYROIDISM NOS  02/23/2007   .  ANEMIA, SECONDARY TO ACUTE BLOOD LOSS  02/23/2007   .  ANXIETY STATE NOS  02/23/2007   .  HYPERTENSION, BENIGN ESSENTIAL  02/23/2007   .  DYSRHYTHMIA, CARDIAC NOS  02/23/2007   .  KIDNEY DISEASE, CHRONIC, STAGE III  02/23/2007   .  FX CLOSED ANKLE NOS  02/23/2007     Labs.  04/09/2014.  WBC 6.6 hemoglobin 11.0 platelets 221.  Sodium 135 potassium 4.3 BUN 17 creatinine 0.9.  Albumin 2.7 otherwise liver function tests within normal limits alkaline phosphatase 117  03/31/2014.  WBC 10.7 hemoglobin 12.6 platelets 428.  Sodium 134 potassium 4.5 BUN 18 creatinine 1.01.   05/06/2014.  Cholesterol 114 HDL 38 LDL 68 triglycerides 80.    UAlk  phosphatase 155-.     Component  Value  Date/Time    WBC  7.7  03/16/2014 0450    RBC  3.52*  03/16/2014 0450    HGB  12.2  03/16/2014 0450    HCT  36.1  03/16/2014 0450    PLT  193  03/16/2014 0450    MCV  102.6*  03/16/2014 0450    LYMPHSABS  1.2  03/15/2014 1206    MONOABS  1.5*  03/15/2014 1206    EOSABS  0.1  03/15/2014 1206    BASOSABS  0.0  03/15/2014 1206   CMP    Component  Value  Date/Time    NA   140  03/16/2014 0450    K  4.9  03/16/2014 0450    CL  104  03/16/2014 0450    CO2  25  03/16/2014 0450    GLUCOSE  108*  03/16/2014 0450    BUN  24*  03/16/2014 0450    CREATININE  1.18*  03/16/2014 0450    CALCIUM  9.2  03/16/2014 0450    PROT  7.1  03/15/2014 1206    ALBUMIN  3.6  03/15/2014 1206    AST  20  03/15/2014 1206    ALT  10  03/15/2014 1206    ALKPHOS  76  03/15/2014 1206    BILITOT  1.0  03/15/2014 1206    GFRNONAA  40*  03/16/2014 0450    GFRAA  46*  03/16/2014 0450   Assessment and Plan    HYPOTHYROIDISM NOS   On Synthroid-apparently she was somewhat noncompliant at home-but I 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  Anemia-this appears to be relatively stable we'll update this appears hemoglobin had dropped slightly on most recent lab.  Cough-we'll start Mucinex 600 mg daily for 5 days also check a chest x-ray-monitor vital signs pulse ox every shift for 72 hours.  Hyperlipidemia-recent cholesterol panel looked unremarkable -continues on low-dose Crestor liver function tests appeared to be stable --   [id number]

## 2014-05-13 ENCOUNTER — Non-Acute Institutional Stay (SKILLED_NURSING_FACILITY): Payer: Medicare Other | Admitting: Internal Medicine

## 2014-05-13 DIAGNOSIS — M25579 Pain in unspecified ankle and joints of unspecified foot: Secondary | ICD-10-CM | POA: Insufficient documentation

## 2014-05-13 DIAGNOSIS — J189 Pneumonia, unspecified organism: Secondary | ICD-10-CM

## 2014-05-13 DIAGNOSIS — M25571 Pain in right ankle and joints of right foot: Secondary | ICD-10-CM

## 2014-05-13 NOTE — Progress Notes (Signed)
Patient ID: Adah SalvageWilma O [name], female   DOB: [birth year], 80 y.o.   MRN: [id number]   This is an acute  visit.  Level care skilled.  Facility [facility] .  [facility].  Chief Complaint   P   acute visit followup URI-right foot pain   .    HPI: Patient is a pleasant 80 year old female who's been quite stable recently-I did see her last week for a dry cough and ordered a chest x-ray which came back showing possible left lower lobe pneumonia-on-call provider did start her on Avelox 400 mg a day for 7 days-clinically she appears to be stable today she is afebrile does not complaining of shortness of breath or cough-appears to be doing well she also received a five-day course of Mucinex.  Nursing staff also noted some foot pain I followed up on that today-talking with patient she says this really only her right foot in the heel area.  There has been no reported history of trauma she says this is just more when there's apparently contact with the bed she-I looked at the area and she does have a small scrape area here that I suspec this may be contributing to her discomfort.  She does not really complain of any ankle or foot pain otherwise.     Past Medical History   Diagnosis  Date   .  Hypertension    .  Hyperlipidemia    .  Bilateral leg edema     Past Surgical History   Procedure  Laterality  Date   .  Ankle fracture surgery     .  Cholecystectomy        Medication List         .            acetaminophen 500 MG tablet    Commonly known as: TYLENOL    Take 500 mg by mouth every 6 (six) hours as needed for mild pain.    amLODipine 5 MG tablet    Commonly known as: NORVASC    Take 5 mg by mouth daily.    carvedilol 25 MG tablet    Commonly known as: COREG    Take 25 mg by mouth 2 (two) times daily with a meal.    cholecalciferol 1000 UNITS tablet    Commonly known as: VITAMIN D    Take 1,000 Units by mouth daily.    feeding supplement (ENSURE COMPLETE) Liqd    Take 237 mLs by mouth 2  (two) times daily between meals.             furosemide 20 MG tablet    Commonly known as: LASIX    Take 20 mg by mouth daily as needed.    gabapentin 300 MG capsule    Commonly known as: NEURONTIN    Take 300 mg by mouth daily.    irbesartan 300 MG tablet    Commonly known as: AVAPRO    Take 300 mg by mouth daily.    levothyroxine 100 MCG tablet    Commonly known as: SYNTHROID, LEVOTHROID    Take 100 mcg by mouth daily before breakfast.    levothyroxine 88 MCG tablet    Commonly known as: SYNTHROID, LEVOTHROID    Take 88 mcg by mouth daily before breakfast. With 100 Mcg + 188 mcg             rosuvastatin 5 MG tablet    Commonly known as: CRESTOR    Take  5 mg by mouth daily.    traMADol 50 MG tablet    Commonly known as: ULTRAM    Take 1 tablet (50 mg total) by mouth every 12 (twelve) hours as needed for moderate pain.    vitamin B-12 1000 MCG tablet    Commonly known as: CYANOCOBALAMIN    Take 1,000 mcg by mouth daily.      History   Substance Use Topics   .  Smoking status:  Never Smoker   .  Smokeless tobacco:  Not on file   .  Alcohol Use:  No   Family history is noncontributory  Review of Systems  DATA OBTAINED: from patient  GENERAL: Feels well no fevers, fatigue, appetite changes has a cough  SKIN: No itching, rash  EYES: No eye pain, redness, discharge  EARS: No earache, tinnitus, change in hearing  NOSE: No congestion, drainage or bleeding  MOUTH/THROAT: No mouth or tooth pain  RESPIRATORY: Cough appears improved but still has occasionally, no wheezing or SOB--possible left lower lobe pneumonia per recent x-ray  CARDIAC: No chest pain, palpitations, lower extremity edema  GI: No abdominal pain, No N/V/D or constipation, No heartburn or reflux  GU: No dysuria, frequency or urgency, or incontinence  MUSCULOSKELETAL: No unrelieved bone/joint pain  NEUROLOGIC: No headache, dizziness or focal weakness  PSYCHIATRIC: No overt anxiety or sadness. Sleeps well. No  behavior issue.                   Physical Exam   Temperature 97.0 pulse 66 respirations 18 blood pressure 117/61 O2 saturation 97% on room air GENERAL APPEARANCE: Alert, conversant. Appropriately groomed. No acute distress. Lying comfortably in bed  SKIN: No diaphoresis rash, or wounds--I do note on right heel there is a small what appears to be somewhat of a scrape area there is really no acute tenderness or edema or erythema or drainage-I cannot really appreciate any firmness here  HEAD: Normocephalic, atraumatic  EYES: Conjunctiva/lids clear. Pupils round, reactive. EOMs intact.  EARS: External exam WNL, canals clear. Hearing grossly normal.  NOSE: No deformity or discharge.  MOUTH/THROAT: Lips w/o lesions  RESPIRATORY: Breathing is even, unlabored. Lung sounds are clear  CARDIOVASCULAR: Heart RRR no murmurs, rubs or gallops. Trace peripheral edema.    MUSCULOSKELETAL: No abnormal joints or musculature--has some lower extremity weakness I suspect this is baseline--there is not really any pain to palpation of the feet bilaterally or with any movement of the ankle  NEUROLOGIC: Oriented X3. Cranial nerves 2-12 grossly intact. Moves all extremities no tremor.  PSYCHIATRIC: Mood and affect appropriate to situation, no behavioral issues-- oriented to self-- -pleasant smiling  Patient Active Problem List    Diagnosis  Date Noted   .  UTI (lower urinary tract infection)  03/15/2014   .  HYPOTHYROIDISM NOS  02/23/2007   .  ANEMIA, SECONDARY TO ACUTE BLOOD LOSS  02/23/2007   .  ANXIETY STATE NOS  02/23/2007   .  HYPERTENSION, BENIGN ESSENTIAL  02/23/2007   .  DYSRHYTHMIA, CARDIAC NOS  02/23/2007   .  KIDNEY DISEASE, CHRONIC, STAGE III  02/23/2007   .  FX CLOSED ANKLE NOS  02/23/2007   Labs.  05/08/2014.  Sodium 137 potassium 4.5 BUN 22 creatinine 1.0.  TSH-2.72.  WBC 7.0 hemoglobin 10.7 platelets 200    04/09/2014.  WBC 6.6 hemoglobin 11.0 platelets 221.  Sodium 135 potassium  4.3 BUN 17 creatinine 0.9.  Albumin 2.7 otherwise liver function tests within normal limits  alkaline phosphatase 117  03/31/2014.  WBC 10.7 hemoglobin 12.6 platelets 428.  Sodium 134 potassium 4.5 BUN 18 creatinine 1.01.  05/06/2014.  Cholesterol 114 HDL 38 LDL 68 triglycerides 80.  UAlk phosphatase 155-.    Component  Value  Date/Time    WBC  7.7  03/16/2014 0450    RBC  3.52*  03/16/2014 0450    HGB  12.2  03/16/2014 0450    HCT  36.1  03/16/2014 0450    PLT  193  03/16/2014 0450    MCV  102.6*  03/16/2014 0450    LYMPHSABS  1.2  03/15/2014 1206    MONOABS  1.5*  03/15/2014 1206    EOSABS  0.1  03/15/2014 1206    BASOSABS  0.0  03/15/2014 1206   CMP    Component  Value  Date/Time    NA  140  03/16/2014 0450    K  4.9  03/16/2014 0450    CL  104  03/16/2014 0450    CO2  25  03/16/2014 0450    GLUCOSE  108*  03/16/2014 0450    BUN  24*  03/16/2014 0450    CREATININE  1.18*  03/16/2014 0450    CALCIUM  9.2  03/16/2014 0450    PROT  7.1  03/15/2014 1206    ALBUMIN  3.6  03/15/2014 1206    AST  20  03/15/2014 1206    ALT  10  03/15/2014 1206    ALKPHOS  76  03/15/2014 1206    BILITOT  1.0  03/15/2014 1206    GFRNONAA  40*  03/16/2014 0450    GFRAA  46*  03/16/2014 0450   Assessment and Plan  HYPOTHYROIDISM NOS  On Synthroid-apparently she was somewhat noncompliant at home-but I suspect this has been more stable 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--TSH is within normal limits on lab done October 15 at 2.7 2  HYPERTENSION, BENIGN ESSENTIAL  Stable continues on Norvasc Lasix as well as Avapro and Coreg-recent blood pressure is 117/61-most recently  KIDNEY DISEASE, CHRONIC, STAGE III  At baseline most recent creatinine 1.0 BUN 22   Anemia-this appears to be relatively stable --hemoglobin 10.7 on most recent lab   Cough- Chest x-ray indicates possible left lower lobe pneumonia-she is completing a course of Avelox-also will extend the Mucinex for 5 additional days apparently she is benefiting from this  continue to monitor but she appears to be quite stable.  Right foot pain-this appears to be more in the heel area there is a slight scraped area-will write an order for wound care to followup on this also encouraged protective measures--leg elevation-- possibly protective boot-try to keep friction off the site-again this will need follow up by wound care I do not see any sign of infection at this point.  .  Hyperlipidemia-recent cholesterol panel looked unremarkable -continues on low-dose Crestor liver function tests appeared to be stable --    [id number]

## 2014-06-10 ENCOUNTER — Other Ambulatory Visit: Payer: Self-pay | Admitting: *Deleted

## 2014-06-10 MED ORDER — TRAMADOL HCL 50 MG PO TABS: ORAL_TABLET | ORAL | Status: DC

## 2014-06-10 NOTE — Telephone Encounter (Signed)
[facility] 

## 2014-07-14 ENCOUNTER — Other Ambulatory Visit: Payer: Self-pay | Admitting: *Deleted

## 2014-07-14 MED ORDER — ZOLPIDEM TARTRATE 5 MG PO TABS: 5.0000 mg | ORAL_TABLET | Freq: Every evening | ORAL | Status: DC | PRN

## 2014-07-14 NOTE — Telephone Encounter (Signed)
[facility] 

## 2014-07-23 ENCOUNTER — Non-Acute Institutional Stay (SKILLED_NURSING_FACILITY): Payer: Medicare Other | Admitting: Internal Medicine

## 2014-07-23 DIAGNOSIS — Z8659 Personal history of other mental and behavioral disorders: Secondary | ICD-10-CM

## 2014-07-23 DIAGNOSIS — E038 Other specified hypothyroidism: Secondary | ICD-10-CM

## 2014-07-23 DIAGNOSIS — I10 Essential (primary) hypertension: Secondary | ICD-10-CM

## 2014-07-23 DIAGNOSIS — E034 Atrophy of thyroid (acquired): Secondary | ICD-10-CM

## 2014-07-23 DIAGNOSIS — N183 Chronic kidney disease, stage 3 unspecified: Secondary | ICD-10-CM

## 2014-07-23 NOTE — Progress Notes (Signed)
MRN: [id number] Name: Kristine [name]  Sex: female Age: 80 y.o. DOB: 03-07-1924  [facility]: [facility] Facility/Room:206D Level Of Care: SNF Provider: Merrilee Seashore D Emergency Contacts: Extended Emergency Contact Information Primary Emergency Contact: Lehr,Nancy Address: [address]          [place] [address] [place] Home Phone: [phone] Work Phone: [phone] Mobile Phone: [phone] Relation: Relative Secondary Emergency Contact: Landress,Bill Address: [address]          [place], [place] [address] [place] Home Phone: [phone] Mobile Phone: [phone] Relation: Son  Code StatusFULL:   Allergies: Amitriptyline; Buspar; Codeine; Dyazide; and Macrolides and ketolides  Chief Complaint  Patient presents with  . Medical Management of Chronic Issues    HPI: Patient is 80 y.o. female who is being seen for routine issues. Was treated for cough with pos CXR last month but wasn't very sick then and has been well since.  Past Medical History  Diagnosis Date  . Hypertension   . Hyperlipidemia   . Bilateral leg edema     Past Surgical History  Procedure Laterality Date  . Ankle fracture surgery    . Cholecystectomy        Medication List       This list is accurate as of: 07/23/14 11:59 PM.  Always use your most recent med list.               acetaminophen 500 MG tablet  Commonly known as:  TYLENOL  Take 500 mg by mouth every 6 (six) hours as needed for mild pain.     amLODipine 2.5 MG tablet  Commonly known as:  NORVASC  Take 2.5 mg by mouth daily.     bisacodyl 10 MG suppository  Commonly known as:  DULCOLAX  Place 10 mg rectally as needed for moderate constipation.     bisacodyl 10 MG/30ML Enem  Commonly known as:  FLEET  Place 10 mg rectally once as needed (constipation.).     carvedilol 25 MG tablet  Commonly known as:  COREG  Take 25 mg by mouth 2 (two) times daily with a meal.     cholecalciferol  1000 UNITS tablet  Commonly known as:  VITAMIN D  Take 1,000 Units by mouth daily.     feeding supplement (ENSURE COMPLETE) Liqd  Take 237 mLs by mouth 2 (two) times daily between meals.     furosemide 20 MG tablet  Commonly known as:  LASIX  Take 20 mg by mouth daily as needed.     gabapentin 300 MG capsule  Commonly known as:  NEURONTIN  Take 300 mg by mouth daily.     irbesartan 300 MG tablet  Commonly known as:  AVAPRO  Take 300 mg by mouth daily.     levothyroxine 100 MCG tablet  Commonly known as:  SYNTHROID, LEVOTHROID  Take 100 mcg by mouth daily before breakfast.     levothyroxine 88 MCG tablet  Commonly known as:  SYNTHROID, LEVOTHROID  Take 88 mcg by mouth daily before breakfast. With 100  Mcg + 188 mcg     magnesium hydroxide 400 MG/5ML suspension  Commonly known as:  MILK OF MAGNESIA  Take 30 mLs by mouth daily as needed for mild constipation.     rosuvastatin 5 MG tablet  Commonly known as:  CRESTOR  Take 5 mg by mouth daily.     traMADol 50 MG tablet  Commonly known as:  ULTRAM  Take  one tablet by mouth every 12 hours as needed for moderate pain     vitamin B-12 1000 MCG tablet  Commonly known as:  CYANOCOBALAMIN  Take 1,000 mcg by mouth daily.     zolpidem 5 MG tablet  Commonly known as:  AMBIEN  Take 1 tablet (5 mg total) by mouth at bedtime as needed for sleep.        No orders of the defined types were placed in this encounter.    Immunization History  Administered Date(s) Administered  . Influenza-Unspecified 05/06/2014    History  Substance Use Topics  . Smoking status: Never Smoker   . Smokeless tobacco: Not on file  . Alcohol Use: No    Review of Systems  DATA OBTAINED: from patient GENERAL:  no fevers, fatigue, appetite changes SKIN: No itching, rash HEENT: No complaint RESPIRATORY: No cough, wheezing, SOB CARDIAC: No chest pain, palpitations, lower extremity edema  GI: No abdominal pain, No N/V/D or constipation, No  heartburn or reflux  GU: No dysuria, frequency or urgency, or incontinence  MUSCULOSKELETAL: No unrelieved bone/joint pain NEUROLOGIC: No headache, dizziness  PSYCHIATRIC: No overt anxiety or sadness  Filed Vitals:   07/23/14 1844  BP: 148/64  Pulse: 68  Temp: 97.1 F (36.2 C)  Resp: 18    Physical Exam  GENERAL APPEARANCE: Alert, conversant, No acute distress  SKIN: No diaphoresis rash HEENT: Unremarkable RESPIRATORY: Breathing is even, unlabored. Lung sounds are clear   CARDIOVASCULAR: Heart RRR no murmurs, rubs or gallops. No peripheral edema  GASTROINTESTINAL: Abdomen is soft, non-tender, not distended w/ normal bowel sounds.  GENITOURINARY: Bladder non tender, not distended  MUSCULOSKELETAL: No abnormal joints or musculature NEUROLOGIC: Cranial nerves 2-12 grossly intact PSYCHIATRIC: Mood and affect appropriate to situation, no behavioral issues  Patient Active Problem List   Diagnosis Date Noted  . Pneumonia 05/13/2014  . Pain in joint, ankle and foot 05/13/2014  . UTI (lower urinary tract infection) 03/15/2014  . Hypothyroidism 02/23/2007  . ANEMIA, SECONDARY TO ACUTE BLOOD LOSS 02/23/2007  . H/O anxiety disorder 02/23/2007  . HYPERTENSION, BENIGN ESSENTIAL 02/23/2007  . DYSRHYTHMIA, CARDIAC NOS 02/23/2007  . KIDNEY DISEASE, CHRONIC, STAGE III 02/23/2007  . FX CLOSED ANKLE NOS 02/23/2007    CBC    Component Value Date/Time   WBC 10.5 03/31/2014 2127   RBC 3.70* 03/31/2014 2127   HGB 12.6 03/31/2014 2127   HCT 37.1 03/31/2014 2127   PLT 428* 03/31/2014 2127   MCV 100.3* 03/31/2014 2127   LYMPHSABS 0.9 03/31/2014 2127   MONOABS 1.7* 03/31/2014 2127   EOSABS 0.2 03/31/2014 2127   BASOSABS 0.0 03/31/2014 2127    CMP     Component Value Date/Time   NA 134* 03/31/2014 2127   K 4.5 03/31/2014 2127   CL 94* 03/31/2014 2127   CO2 27 03/31/2014 2127   GLUCOSE 125* 03/31/2014 2127   BUN 18 03/31/2014 2127   CREATININE 1.01 03/31/2014 2127   CALCIUM 9.8  03/31/2014 2127   PROT 6.8 03/31/2014 2127   ALBUMIN 2.8* 03/31/2014 2127   AST 21 03/31/2014 2127   ALT 22 03/31/2014 2127   ALKPHOS 153* 03/31/2014 2127   BILITOT 0.7 03/31/2014 2127   GFRNONAA 48* 03/31/2014 2127   GFRAA 56* 03/31/2014 2127    Assessment and Plan  HYPERTENSION, BENIGN ESSENTIAL Slightly elevated today but not enough to change regimen of norvasc, lasix coreg, avapro  Hypothyroidism TSH 2.72 so stable on yes, 188 mcg daily  KIDNEY DISEASE, CHRONIC, STAGE  III BUN 22/Cr 1.0 in 04/2014  H/O anxiety disorder Was on meds prior, now on nothing except ambien for sleep    Margit HanksALEXANDER, ANNE D, MD

## 2014-07-29 ENCOUNTER — Encounter: Payer: Self-pay | Admitting: Internal Medicine

## 2014-07-29 NOTE — Assessment & Plan Note (Signed)
TSH 2.72 so stable on yes, 188 mcg daily

## 2014-07-29 NOTE — Assessment & Plan Note (Signed)
Slightly elevated today but not enough to change regimen of norvasc, lasix coreg, avapro

## 2014-07-29 NOTE — Assessment & Plan Note (Signed)
Was on meds prior, now on nothing except ambien for sleep

## 2014-07-29 NOTE — Assessment & Plan Note (Signed)
BUN 22/Cr 1.0 in 04/2014

## 2014-08-29 ENCOUNTER — Encounter: Payer: Self-pay | Admitting: Internal Medicine

## 2014-08-29 ENCOUNTER — Non-Acute Institutional Stay (SKILLED_NURSING_FACILITY): Payer: Medicare Other | Admitting: Internal Medicine

## 2014-08-29 DIAGNOSIS — I739 Peripheral vascular disease, unspecified: Secondary | ICD-10-CM

## 2014-08-29 DIAGNOSIS — E038 Other specified hypothyroidism: Secondary | ICD-10-CM

## 2014-08-29 DIAGNOSIS — E034 Atrophy of thyroid (acquired): Secondary | ICD-10-CM | POA: Diagnosis not present

## 2014-08-29 DIAGNOSIS — R270 Ataxia, unspecified: Secondary | ICD-10-CM | POA: Diagnosis not present

## 2014-08-29 DIAGNOSIS — I69993 Ataxia following unspecified cerebrovascular disease: Secondary | ICD-10-CM

## 2014-08-29 DIAGNOSIS — D519 Vitamin B12 deficiency anemia, unspecified: Secondary | ICD-10-CM

## 2014-08-29 DIAGNOSIS — N183 Chronic kidney disease, stage 3 unspecified: Secondary | ICD-10-CM

## 2014-08-29 DIAGNOSIS — E785 Hyperlipidemia, unspecified: Secondary | ICD-10-CM | POA: Diagnosis not present

## 2014-08-29 DIAGNOSIS — Z8659 Personal history of other mental and behavioral disorders: Secondary | ICD-10-CM | POA: Diagnosis not present

## 2014-08-29 DIAGNOSIS — I10 Essential (primary) hypertension: Secondary | ICD-10-CM

## 2014-08-29 DIAGNOSIS — M6281 Muscle weakness (generalized): Secondary | ICD-10-CM | POA: Insufficient documentation

## 2014-08-29 NOTE — Assessment & Plan Note (Signed)
Pt is doing well on no meds;no longer even on ambien;will monitor behavoir

## 2014-08-29 NOTE — Progress Notes (Signed)
MRN: [id number] Name: Kristine [name]  Sex: female Age: 81 y.o. DOB: 03/16/1924  [facility] #: [facility] Facility/Room:206D Level Of Care: SNF Provider: Merrilee Seashore D Emergency Contacts: Extended Emergency Contact Information Primary Emergency Contact: Pistilli,Nancy Address: [address]          [place] [address] [place] Home Phone: [phone] Work Phone: [phone] Mobile Phone: [phone] Relation: Relative Secondary Emergency Contact: Racicot,Bill Address: [address]          [place], [place] [address] [place] Home Phone: [phone] Mobile Phone: [phone] Relation: Son  Code Status: FULL  Allergies: Amitriptyline; Buspar; Codeine; Dyazide; and Macrolides and ketolides  Chief Complaint  Patient presents with  . Medical Management of Chronic Issues    HPI: Patient is 81 y.o. female who is being seen for routine issues.  Past Medical History  Diagnosis Date  . Hypertension   . Bilateral leg edema   . Hyperlipidemia     Past Surgical History  Procedure Laterality Date  . Ankle fracture surgery    . Cholecystectomy        Medication List       This list is accurate as of: 08/29/14 11:59 PM.  Always use your most recent med list.               acetaminophen 500 MG tablet  Commonly known as:  TYLENOL  Take 500 mg by mouth every 6 (six) hours as needed for mild pain.     amLODipine 2.5 MG tablet  Commonly known as:  NORVASC  Take 2.5 mg by mouth daily.     carvedilol 25 MG tablet  Commonly known as:  COREG  Take 25 mg by mouth 2 (two) times daily with a meal.     cholecalciferol 1000 UNITS tablet  Commonly known as:  VITAMIN D  Take 1,000 Units by mouth daily.     feeding supplement (ENSURE COMPLETE) Liqd  Take 237 mLs by mouth 2 (two) times daily between meals.     furosemide 20 MG tablet  Commonly known as:  LASIX  Take 20 mg by mouth daily as needed.     irbesartan 300 MG tablet  Commonly known as:   AVAPRO  Take 300 mg by mouth daily.     levothyroxine 100 MCG tablet  Commonly known as:  SYNTHROID, LEVOTHROID  Take 100 mcg by mouth daily before breakfast.     levothyroxine 88 MCG tablet  Commonly known as:  SYNTHROID, LEVOTHROID  Take 88 mcg by mouth daily before breakfast. With 100  Mcg + 188 mcg     rosuvastatin 5 MG tablet  Commonly known as:  CRESTOR  Take 5 mg by mouth daily.     traMADol 50 MG tablet  Commonly known as:  ULTRAM  Take one tablet by mouth every 12 hours as needed for moderate pain     vitamin B-12 1000 MCG tablet  Commonly known as:  CYANOCOBALAMIN  Take 1,000 mcg by mouth daily.        No orders of the defined types were placed in this encounter.    Immunization History  Administered Date(s) Administered  . Influenza-Unspecified 05/06/2014    History  Substance Use Topics  . Smoking status: Never Smoker   . Smokeless tobacco: Not on file  . Alcohol Use: No    Review of Systems  DATA OBTAINED: from patient, nurse GENERAL:  no fevers, fatigue, appetite changes SKIN: No itching, rash HEENT:  No complaint RESPIRATORY: No cough, wheezing, SOB CARDIAC: No chest pain, palpitations, lower extremity edema  GI: No abdominal pain, No N/V/D or constipation, No heartburn or reflux  GU: No dysuria, frequency or urgency, or incontinence  MUSCULOSKELETAL: No unrelieved bone/joint pain NEUROLOGIC: No headache, dizziness  PSYCHIATRIC: No overt anxiety or sadness  Filed Vitals:   08/29/14 1428  BP: 148/64  Pulse: 68  Resp: 97    Physical Exam  GENERAL APPEARANCE: Alert, conversant, No acute distress , sitting in WC  SKIN: No diaphoresis rash, or wounds HEENT: Unremarkable RESPIRATORY: Breathing is even, unlabored. Lung sounds are clear   CARDIOVASCULAR: Heart RRR no murmurs, rubs or gallops. 2+ peripheral edema  GASTROINTESTINAL: Abdomen is soft, non-tender, not distended w/ normal bowel sounds.  GENITOURINARY: Bladder non tender, not  distended  MUSCULOSKELETAL: No abnormal joints or musculature NEUROLOGIC: Cranial nerves 2-12 grossly intact. Moves all extremities PSYCHIATRIC: Mood and affect appropriate to situation, no behavioral issues  Patient Active Problem List   Diagnosis Date Noted  . CVA, old, ataxia 09/08/2014  . PVD (peripheral vascular disease) 09/08/2014  . Hyperlipidemia   . Muscle weakness of right upper extremity 08/29/2014  . Pneumonia 05/13/2014  . Pain in joint, ankle and foot 05/13/2014  . UTI (lower urinary tract infection) 03/15/2014  . Hypothyroidism 02/23/2007  . Anemia, B12 deficiency 02/23/2007  . H/O anxiety disorder 02/23/2007  . HYPERTENSION, BENIGN ESSENTIAL 02/23/2007  . DYSRHYTHMIA, CARDIAC NOS 02/23/2007  . KIDNEY DISEASE, CHRONIC, STAGE III 02/23/2007  . FX CLOSED ANKLE NOS 02/23/2007    CBC    Component Value Date/Time   WBC 10.5 03/31/2014 2127   RBC 3.70* 03/31/2014 2127   HGB 12.6 03/31/2014 2127   HCT 37.1 03/31/2014 2127   PLT 428* 03/31/2014 2127   MCV 100.3* 03/31/2014 2127   LYMPHSABS 0.9 03/31/2014 2127   MONOABS 1.7* 03/31/2014 2127   EOSABS 0.2 03/31/2014 2127   BASOSABS 0.0 03/31/2014 2127    CMP     Component Value Date/Time   NA 134* 03/31/2014 2127   K 4.5 03/31/2014 2127   CL 94* 03/31/2014 2127   CO2 27 03/31/2014 2127   GLUCOSE 125* 03/31/2014 2127   BUN 18 03/31/2014 2127   CREATININE 1.01 03/31/2014 2127   CALCIUM 9.8 03/31/2014 2127   PROT 6.8 03/31/2014 2127   ALBUMIN 2.8* 03/31/2014 2127   AST 21 03/31/2014 2127   ALT 22 03/31/2014 2127   ALKPHOS 153* 03/31/2014 2127   BILITOT 0.7 03/31/2014 2127   GFRNONAA 48* 03/31/2014 2127   GFRAA 56* 03/31/2014 2127    Assessment and Plan  H/O anxiety disorder Pt is doing well on no meds;no longer even on ambien;will monitor behavoir   KIDNEY DISEASE, CHRONIC, STAGE III Increase in BLE edema and 16 pound weight gain in 3 months;BMP to check any changes in kidney  function   Hypothyroidism Pt is on such a high dose 188 mcg but TSH is 1.87   HYPERTENSION, BENIGN ESSENTIAL Pt controlled for her age on multiple agents already; lasix increased to 40 mg to cover for pedal edema   Muscle weakness of right upper extremity And pain in R shoulder;pt has started OT for this for 4 weeks   Hyperlipidemia On crestor 5 mg in 04/1014  LDL was 60, HDL 38   Anemia, B12 deficiency In 04/2014 Hb 10.7/ Hct 32.1,  MCV 107.7; pt on daily vit B supplement which based on MCV is needed;order B12, folate, iron levels   CVA,  old, ataxia No weakness;start on ASA 81 mg, no stronger 2/2 falls   PVD (peripheral vascular disease) Without wounds currently, or pain;pt on statin, will start ASA 81 mg     Margit HanksALEXANDER, Peg Fifer D, MD

## 2014-08-29 NOTE — Assessment & Plan Note (Signed)
And pain in R shoulder;pt has started OT for this for 4 weeks

## 2014-08-29 NOTE — Assessment & Plan Note (Addendum)
Pt is on such a high dose 188 mcg but TSH is 1.87

## 2014-08-29 NOTE — Assessment & Plan Note (Addendum)
Increase in BLE edema and 16 pound weight gain in 3 months;BMP to check any changes in kidney function

## 2014-08-29 NOTE — Assessment & Plan Note (Addendum)
Pt controlled for her age on multiple agents already; lasix increased to 40 mg to cover for pedal edema

## 2014-09-01 LAB — TSH: TSH: 1.87 u[IU]/mL (ref <=5.90)

## 2014-09-04 LAB — HEPATIC FUNCTION PANEL
ALK PHOS: 85 U/L (ref 25–125)
ALT: 8 U/L (ref 7–35)
AST: 13 U/L (ref 13–35)

## 2014-09-04 LAB — CBC AND DIFFERENTIAL
HEMATOCRIT: 35 % — AB (ref 36–46)
HEMOGLOBIN: 11.8 g/dL — AB (ref 12.0–16.0)
Platelets: 217 10*3/uL (ref 150–399)
WBC: 5.7 10*3/mL

## 2014-09-04 LAB — LIPID PANEL
Cholesterol: 148 mg/dL (ref 0–200)
HDL: 50 mg/dL (ref 35–70)
LDL CALC: 87 mg/dL
Triglycerides: 56 mg/dL (ref 40–160)

## 2014-09-04 LAB — HEMOGLOBIN A1C: Hgb A1c MFr Bld: 6 % (ref 4.0–6.0)

## 2014-09-08 ENCOUNTER — Encounter: Payer: Self-pay | Admitting: Internal Medicine

## 2014-09-08 DIAGNOSIS — I739 Peripheral vascular disease, unspecified: Secondary | ICD-10-CM | POA: Insufficient documentation

## 2014-09-08 DIAGNOSIS — E785 Hyperlipidemia, unspecified: Secondary | ICD-10-CM | POA: Insufficient documentation

## 2014-09-08 DIAGNOSIS — I69993 Ataxia following unspecified cerebrovascular disease: Secondary | ICD-10-CM | POA: Insufficient documentation

## 2014-09-08 NOTE — Assessment & Plan Note (Signed)
In 04/2014 Hb 10.7/ Hct 32.1,  MCV 107.7; pt on daily vit B supplement which based on MCV is needed;order B12, folate, iron levels

## 2014-09-08 NOTE — Assessment & Plan Note (Signed)
Without wounds currently, or pain;pt on statin, will start ASA 81 mg

## 2014-09-08 NOTE — Assessment & Plan Note (Signed)
No weakness;start on ASA 81 mg, no stronger 2/2 falls

## 2014-09-08 NOTE — Assessment & Plan Note (Signed)
On crestor 5 mg in 04/1014  LDL was 60, HDL 38

## 2014-10-08 ENCOUNTER — Non-Acute Institutional Stay (SKILLED_NURSING_FACILITY): Payer: Medicare Other | Admitting: Internal Medicine

## 2014-10-08 DIAGNOSIS — I1310 Hypertensive heart and chronic kidney disease without heart failure, with stage 1 through stage 4 chronic kidney disease, or unspecified chronic kidney disease: Secondary | ICD-10-CM | POA: Diagnosis not present

## 2014-10-08 DIAGNOSIS — N183 Chronic kidney disease, stage 3 unspecified: Secondary | ICD-10-CM

## 2014-10-08 NOTE — Progress Notes (Signed)
MRN: [id number] Name: Kristine [name]  Sex: female Age: 81 y.o. DOB: 18-Aug-1923  [facility] Facility/Room:206D Level Of Care: SNF Provider: Merrilee SeashoreALEXANDER, Lieutenant Abarca D Emergency Contacts: Extended Emergency Contact Information Primary Emergency Contact: Gemmer,Nancy Address: [address]          [place] [id number] [place] Home Phone: [phone] Work Phone: [phone] Mobile Phone: [phone] Relation: Relative Secondary Emergency Contact: Haith,Bill Address: [address]          [place], [place] [address] [place] Home Phone: [phone] Mobile Phone: [phone] Relation: Son  Code Status: FULL  Allergies: Amitriptyline; Buspar; Codeine; Dyazide; and Macrolides and ketolides  Chief Complaint  Patient presents with  . Medical Management of Chronic Issues    HPI: Patient is 81 y.o. female who is being seen for routine issues.  Past Medical History  Diagnosis Date  . Hypertension   . Bilateral leg edema   . Hyperlipidemia     Past Surgical History  Procedure Laterality Date  . Ankle fracture surgery    . Cholecystectomy        Medication List       This list is accurate as of: 10/08/14 11:59 PM.  Always use your most recent med list.               acetaminophen 500 MG tablet  Commonly known as:  TYLENOL  Take 500 mg by mouth every 6 (six) hours as needed for mild pain.     amLODipine 2.5 MG tablet  Commonly known as:  NORVASC  Take 2.5 mg by mouth daily.     carvedilol 25 MG tablet  Commonly known as:  COREG  Take 25 mg by mouth 2 (two) times daily with a meal.     cholecalciferol 1000 UNITS tablet  Commonly known as:  VITAMIN D  Take 1,000 Units by mouth daily.     feeding supplement (ENSURE COMPLETE) Liqd  Take 237 mLs by mouth 2 (two) times daily between meals.     furosemide 20 MG tablet  Commonly known as:  LASIX  Take 20 mg by mouth daily as needed.     irbesartan 300 MG tablet  Commonly known  as:  AVAPRO  Take 300 mg by mouth daily.     levothyroxine 100 MCG tablet  Commonly known as:  SYNTHROID, LEVOTHROID  Take 100 mcg by mouth daily before breakfast.     levothyroxine 88 MCG tablet  Commonly known as:  SYNTHROID, LEVOTHROID  Take 88 mcg by mouth daily before breakfast. With 100  Mcg + 188 mcg     rosuvastatin 5 MG tablet  Commonly known as:  CRESTOR  Take 5 mg by mouth daily.     traMADol 50 MG tablet  Commonly known as:  ULTRAM  Take one tablet by mouth every 12 hours as needed for moderate pain     vitamin B-12 1000 MCG tablet  Commonly known as:  CYANOCOBALAMIN  Take 1,000 mcg by mouth daily.        No orders of the defined types were placed in this encounter.    Immunization History  Administered Date(s) Administered  . Influenza-Unspecified 05/06/2014    History  Substance Use Topics  . Smoking status: Never Smoker   . Smokeless tobacco: Not on file  . Alcohol Use: No    Review of Systems  DATA OBTAINED: from patient, nurse GENERAL:  no fevers, fatigue, appetite changes SKIN: No itching, rash HEENT:  No complaint RESPIRATORY: No cough, wheezing, SOB CARDIAC: No chest pain, palpitations, lower extremity edema  GI: No abdominal pain, No N/V/D or constipation, No heartburn or reflux  GU: No dysuria, frequency or urgency, or incontinence  MUSCULOSKELETAL: No unrelieved bone/joint pain NEUROLOGIC: No headache, dizziness  PSYCHIATRIC: No overt anxiety or sadness  Filed Vitals:   10/08/14 2129  BP: 158/65  Pulse: 68  Temp: 97.1 F (36.2 C)  Resp: 18    Physical Exam  GENERAL APPEARANCE: Alert, conversant, No acute distress  SKIN: No diaphoresis rash HEENT: Unremarkable RESPIRATORY: Breathing is even, unlabored. Lung sounds are clear   CARDIOVASCULAR: Heart RRR no murmurs, rubs or gallops. No peripheral edema  GASTROINTESTINAL: Abdomen is soft, non-tender, not distended w/ normal bowel sounds.  GENITOURINARY: Bladder non tender, not  distended  MUSCULOSKELETAL: No abnormal joints or musculature NEUROLOGIC: Cranial nerves 2-12 grossly intact. Moves all extremities PSYCHIATRIC: Mood and affect appropriate to situation, no behavioral issues  Patient Active Problem List   Diagnosis Date Noted  . CVA, old, ataxia 09/08/2014  . PVD (peripheral vascular disease) 09/08/2014  . Hyperlipidemia   . Muscle weakness of right upper extremity 08/29/2014  . Pneumonia 05/13/2014  . Pain in joint, ankle and foot 05/13/2014  . UTI (lower urinary tract infection) 03/15/2014  . Hypothyroidism 02/23/2007  . Anemia, B12 deficiency 02/23/2007  . H/O anxiety disorder 02/23/2007  . Hypertensive heart/kidney disease without HF and with CKD stage III 02/23/2007  . DYSRHYTHMIA, CARDIAC NOS 02/23/2007  . KIDNEY DISEASE, CHRONIC, STAGE III 02/23/2007  . FX CLOSED ANKLE NOS 02/23/2007    CBC    Component Value Date/Time   WBC 10.5 03/31/2014 2127   RBC 3.70* 03/31/2014 2127   HGB 12.6 03/31/2014 2127   HCT 37.1 03/31/2014 2127   PLT 428* 03/31/2014 2127   MCV 100.3* 03/31/2014 2127   LYMPHSABS 0.9 03/31/2014 2127   MONOABS 1.7* 03/31/2014 2127   EOSABS 0.2 03/31/2014 2127   BASOSABS 0.0 03/31/2014 2127    CMP     Component Value Date/Time   NA 134* 03/31/2014 2127   K 4.5 03/31/2014 2127   CL 94* 03/31/2014 2127   CO2 27 03/31/2014 2127   GLUCOSE 125* 03/31/2014 2127   BUN 18 03/31/2014 2127   CREATININE 1.01 03/31/2014 2127   CALCIUM 9.8 03/31/2014 2127   PROT 6.8 03/31/2014 2127   ALBUMIN 2.8* 03/31/2014 2127   AST 21 03/31/2014 2127   ALT 22 03/31/2014 2127   ALKPHOS 153* 03/31/2014 2127   BILITOT 0.7 03/31/2014 2127   GFRNONAA 48* 03/31/2014 2127   GFRAA 56* 03/31/2014 2127    Assessment and Plan  KIDNEY DISEASE, CHRONIC, STAGE III In 3/1 GFR -56, CrCl -59; BLE edema improved since last visit 2/2 increase in lasix dose to 40 mg daily, bu tpt c/o so much urinating is getting in the way of socialization, so have  planned to decrease back to original dose of 20 mg;Plan - dec Lasix back tp 20 mg daily, Continue ARB irbesartan 300 mg daily; follow BMP q 3-6 months , monitor weekly weights   Hypertensive heart/kidney disease without HF and with CKD stage III Not well controlled today and review of past BP is like today or a little lower;Plan - increase norvasc to 5 mg daily, lasix goes back to 40 ng daily, continue coreg 25 mg BID and ibelosartan 300 mg po daily.     Margit Hanks, MD

## 2014-10-13 ENCOUNTER — Encounter: Payer: Self-pay | Admitting: Internal Medicine

## 2014-10-13 NOTE — Assessment & Plan Note (Addendum)
In 3/1 GFR -56, CrCl -59; BLE edema improved since last visit 2/2 increase in lasix dose to 40 mg daily, bu tpt c/o so much urinating is getting in the way of socialization, so have planned to decrease back to original dose of 20 mg;Plan - dec Lasix back tp 20 mg daily, Continue ARB irbesartan 300 mg daily; follow BMP q 3-6 months , monitor weekly weights

## 2014-10-13 NOTE — Assessment & Plan Note (Addendum)
Not well controlled today and review of past BP is like today or a little lower;Plan - increase norvasc to 5 mg daily, lasix goes back to 20 mg daily, continue coreg 25 mg BID and irbelosartan 300 mg po daily.

## 2014-11-19 LAB — BASIC METABOLIC PANEL
BUN: 27 mg/dL — AB (ref 4–21)
GLUCOSE: 110 mg/dL
Potassium: 4.4 mmol/L (ref 3.4–5.3)
Sodium: 137 mmol/L (ref 137–147)

## 2014-12-01 ENCOUNTER — Encounter: Payer: Self-pay | Admitting: *Deleted

## 2014-12-02 ENCOUNTER — Non-Acute Institutional Stay (SKILLED_NURSING_FACILITY): Payer: Medicare Other | Admitting: Internal Medicine

## 2014-12-02 ENCOUNTER — Encounter: Payer: Self-pay | Admitting: Internal Medicine

## 2014-12-02 DIAGNOSIS — I1310 Hypertensive heart and chronic kidney disease without heart failure, with stage 1 through stage 4 chronic kidney disease, or unspecified chronic kidney disease: Secondary | ICD-10-CM | POA: Diagnosis not present

## 2014-12-02 DIAGNOSIS — R600 Localized edema: Secondary | ICD-10-CM

## 2014-12-02 DIAGNOSIS — N183 Chronic kidney disease, stage 3 unspecified: Secondary | ICD-10-CM

## 2014-12-02 NOTE — Progress Notes (Signed)
MRN: [id number] Name: Kristine [name]  Sex: female Age: 81 y.o. DOB: 1924/03/26  [facility] Facility/Room:206D Level Of Care: SNF Provider: Merrilee Seashore D Emergency Contacts: Extended Emergency Contact Information Primary Emergency Contact: Jernberg,Nancy Address: [address]          [place] [address] [place] Home Phone: [phone] Work Phone: [phone] Mobile Phone: [phone] Relation: Relative Secondary Emergency Contact: Piggott,Bill Address: [address]          [place], [place] [address] [place] Home Phone: [phone] Mobile Phone: [phone] Relation: Son  Code Status: FULL  Allergies: Amitriptyline; Buspar; Codeine; Dyazide; and Macrolides and ketolides  Chief Complaint  Patient presents with  . Medical Management of Chronic Issues    HPI: Patient is 81 y.o. female who is being seen for routine issues.  Past Medical History  Diagnosis Date  . Hypertension   . Bilateral leg edema   . Hyperlipidemia     Past Surgical History  Procedure Laterality Date  . Ankle fracture surgery    . Cholecystectomy        Medication List       This list is accurate as of: 12/02/14 11:59 PM.  Always use your most recent med list.               acetaminophen 500 MG tablet  Commonly known as:  TYLENOL  Take 500 mg by mouth every 6 (six) hours as needed for mild pain.     amLODipine 2.5 MG tablet  Commonly known as:  NORVASC  Take 2.5 mg by mouth daily.     aspirin 81 MG chewable tablet  Chew 81 mg by mouth daily.     carboxymethylcellulose 0.5 % Soln  Commonly known as:  REFRESH PLUS  Place 1 drop into both eyes 2 (two) times daily as needed.     carvedilol 25 MG tablet  Commonly known as:  COREG  Take 25 mg by mouth 2 (two) times daily with a meal. For HTN     escitalopram 10 MG tablet  Commonly known as:  LEXAPRO  Take 10 mg by mouth daily. For depression     feeding supplement (ENSURE COMPLETE)  Liqd  Take 237 mLs by mouth 2 (two) times daily between meals.     furosemide 40 MG tablet  Commonly known as:  LASIX  Take 40 mg by mouth 2 (two) times a week. Saturday and Sunday     irbesartan 300 MG tablet  Commonly known as:  AVAPRO  Take 300 mg by mouth daily. For HTN     levothyroxine 100 MCG tablet  Commonly known as:  SYNTHROID, LEVOTHROID  Take 100 mcg by mouth daily before breakfast.     levothyroxine 88 MCG tablet  Commonly known as:  SYNTHROID, LEVOTHROID  Take 88 mcg by mouth daily before breakfast. With 100  Mcg + 188 mcg     rosuvastatin 5 MG tablet  Commonly known as:  CRESTOR  Take 5 mg by mouth daily. To lower cholesterol     traMADol 50 MG tablet  Commonly known as:  ULTRAM  Take one tablet by mouth every 12 hours as needed for moderate pain     vitamin B-12 1000 MCG tablet  Commonly known as:  CYANOCOBALAMIN  Take 1,000 mcg by mouth daily.     Vitamin D3 50000 UNITS Caps  Take 1 capsule by mouth every 30 (thirty) days.  No orders of the defined types were placed in this encounter.    Immunization History  Administered Date(s) Administered  . Influenza-Unspecified 05/06/2014  . PPD Test 04/07/2014    History  Substance Use Topics  . Smoking status: Never Smoker   . Smokeless tobacco: Not on file  . Alcohol Use: No    Review of Systems  DATA OBTAINED: from patient, nurse GENERAL:  no fevers, fatigue, appetite changes SKIN: No itching, rash HEENT: No complaint RESPIRATORY: No cough, wheezing, SOB CARDIAC: No chest pain, palpitations,+ lower extremity edema  GI: No abdominal pain, No N/V/D or constipation, No heartburn or reflux  GU: No dysuria, frequency or urgency, or incontinence  MUSCULOSKELETAL: No unrelieved bone/joint pain NEUROLOGIC: No headache, dizziness  PSYCHIATRIC: No overt anxiety or sadness  Filed Vitals:   12/02/14 2039  BP: 134/60  Pulse: 75  Temp: 97.3 F (36.3 C)  Resp: 18    Physical Exam  GENERAL  APPEARANCE: Alert, conversant, No acute distress  SKIN: No diaphoresis rash HEENT: Unremarkable RESPIRATORY: Breathing is even, unlabored. Lung sounds are clear   CARDIOVASCULAR: Heart RRR no murmurs, rubs or gallops. 1+ peripheral edema  GASTROINTESTINAL: Abdomen is soft, non-tender, not distended w/ normal bowel sounds.  GENITOURINARY: Bladder non tender, not distended  MUSCULOSKELETAL: No abnormal joints or musculature NEUROLOGIC: Cranial nerves 2-12 grossly intact PSYCHIATRIC: Mood and affect appropriate to situation, no behavioral issues  Patient Active Problem List   Diagnosis Date Noted  . Bilateral leg edema 12/06/2014  . CVA, old, ataxia 09/08/2014  . PVD (peripheral vascular disease) 09/08/2014  . Hyperlipidemia   . Muscle weakness of right upper extremity 08/29/2014  . Pneumonia 05/13/2014  . Pain in joint, ankle and foot 05/13/2014  . UTI (lower urinary tract infection) 03/15/2014  . Hypothyroidism 02/23/2007  . Anemia, B12 deficiency 02/23/2007  . H/O anxiety disorder 02/23/2007  . Hypertensive heart/kidney disease without HF and with CKD stage III 02/23/2007  . DYSRHYTHMIA, CARDIAC NOS 02/23/2007  . KIDNEY DISEASE, CHRONIC, STAGE III 02/23/2007  . FX CLOSED ANKLE NOS 02/23/2007    CBC    Component Value Date/Time   WBC 5.7 09/04/2014   WBC 10.5 03/31/2014 2127   RBC 3.70* 03/31/2014 2127   HGB 11.8* 09/04/2014   HCT 35* 09/04/2014   PLT 217 09/04/2014   MCV 100.3* 03/31/2014 2127   LYMPHSABS 0.9 03/31/2014 2127   MONOABS 1.7* 03/31/2014 2127   EOSABS 0.2 03/31/2014 2127   BASOSABS 0.0 03/31/2014 2127    CMP     Component Value Date/Time   NA 137 11/19/2014   NA 134* 03/31/2014 2127   K 4.4 11/19/2014   CL 94* 03/31/2014 2127   CO2 27 03/31/2014 2127   GLUCOSE 125* 03/31/2014 2127   BUN 27* 11/19/2014   BUN 18 03/31/2014 2127   CREATININE 1.01 03/31/2014 2127   CALCIUM 9.8 03/31/2014 2127   PROT 6.8 03/31/2014 2127   ALBUMIN 2.8* 03/31/2014  2127   AST 13 09/04/2014   ALT 8 09/04/2014   ALKPHOS 85 09/04/2014   BILITOT 0.7 03/31/2014 2127   GFRNONAA 48* 03/31/2014 2127   GFRAA 56* 03/31/2014 2127    Assessment and Plan  Hypertensive heart/kidney disease without HF and with CKD stage III BP has been stable but LE edema has not, so inc lasix to 20 mg QOD, 40 mg QOD;continue norvasc and irbesartan 300 mg daily   KIDNEY DISEASE, CHRONIC, STAGE III Renal fx stable but will monitor since need to inc lasix  2/2 BLE edema.   Bilateral leg edema Persists despite good BP control;increase lasix to 20 mg QOD, lasix 40 mg QOD     Margit HanksALEXANDER, Boomer Winders D, MD

## 2014-12-06 ENCOUNTER — Encounter: Payer: Self-pay | Admitting: Internal Medicine

## 2014-12-06 DIAGNOSIS — R600 Localized edema: Secondary | ICD-10-CM | POA: Insufficient documentation

## 2014-12-06 NOTE — Assessment & Plan Note (Signed)
Persists despite good BP control;increase lasix to 20 mg QOD, lasix 40 mg QOD

## 2014-12-06 NOTE — Assessment & Plan Note (Signed)
Renal fx stable but will monitor since need to inc lasix 2/2 BLE edema.

## 2014-12-06 NOTE — Assessment & Plan Note (Signed)
BP has been stable but LE edema has not, so inc lasix to 20 mg QOD, 40 mg QOD;continue norvasc and irbesartan 300 mg daily

## 2015-01-10 ENCOUNTER — Non-Acute Institutional Stay (SKILLED_NURSING_FACILITY): Payer: Medicare Other | Admitting: Internal Medicine

## 2015-01-10 ENCOUNTER — Encounter: Payer: Self-pay | Admitting: Internal Medicine

## 2015-01-10 DIAGNOSIS — E038 Other specified hypothyroidism: Secondary | ICD-10-CM

## 2015-01-10 DIAGNOSIS — E034 Atrophy of thyroid (acquired): Secondary | ICD-10-CM

## 2015-01-10 NOTE — Progress Notes (Signed)
MRN: [id number] Name: Kristine [name]  Sex: female Age: 81 y.o. DOB: [birth year]  [facility]: [facility] Facility/Room: Level Of Care: SNF Provider: Merrilee Seashore D Emergency Contacts: Extended Emergency Contact Information Primary Emergency Contact: Salva,Nancy Address: [address]          [place] [address] [place] Home Phone: [phone] Work Phone: [phone] Mobile Phone: [phone] Relation: Relative Secondary Emergency Contact: Ke,Bill Address: [address]          [place], [place] [address] [place] Home Phone: [phone] Mobile Phone: [phone] Relation: Son  Code Status: FULL  Allergies: Amitriptyline; Buspar; Codeine; Dyazide; and Macrolides and ketolides  Chief Complaint  Patient presents with  . Medical Management of Chronic Issues    HPI: Patient is 81 y.o. female who is being seen for routine issues.  Past Medical History  Diagnosis Date  . Hypertension   . Bilateral leg edema   . Hyperlipidemia     Past Surgical History  Procedure Laterality Date  . Ankle fracture surgery    . Cholecystectomy        Medication List       This list is accurate as of: 01/10/15  2:34 PM.  Always use your most recent med list.               acetaminophen 500 MG tablet  Commonly known as:  TYLENOL  Take 500 mg by mouth every 6 (six) hours as needed for mild pain.     amLODipine 2.5 MG tablet  Commonly known as:  NORVASC  Take 2.5 mg by mouth daily.     aspirin 81 MG chewable tablet  Chew 81 mg by mouth daily.     carboxymethylcellulose 0.5 % Soln  Commonly known as:  REFRESH PLUS  Place 1 drop into both eyes 2 (two) times daily as needed.     carvedilol 25 MG tablet  Commonly known as:  COREG  Take 25 mg by mouth 2 (two) times daily with a meal. For HTN     escitalopram 10 MG tablet  Commonly known as:  LEXAPRO  Take 10 mg by mouth daily. For depression     feeding supplement (ENSURE COMPLETE) Liqd   Take 237 mLs by mouth 2 (two) times daily between meals.     furosemide 40 MG tablet  Commonly known as:  LASIX  Take 40 mg by mouth 2 (two) times a week. Saturday and Sunday     irbesartan 300 MG tablet  Commonly known as:  AVAPRO  Take 300 mg by mouth daily. For HTN     levothyroxine 100 MCG tablet  Commonly known as:  SYNTHROID, LEVOTHROID  Take 100 mcg by mouth daily before breakfast.     levothyroxine 88 MCG tablet  Commonly known as:  SYNTHROID, LEVOTHROID  Take 88 mcg by mouth daily before breakfast. With 100  Mcg + 188 mcg     rosuvastatin 5 MG tablet  Commonly known as:  CRESTOR  Take 5 mg by mouth daily. To lower cholesterol     traMADol 50 MG tablet  Commonly known as:  ULTRAM  Take one tablet by mouth every 12 hours as needed for moderate pain     vitamin B-12 1000 MCG tablet  Commonly known as:  CYANOCOBALAMIN  Take 1,000 mcg by mouth daily.     Vitamin D3 50000 UNITS Caps  Take 1 capsule by mouth every 30 (thirty) days.  No orders of the defined types were placed in this encounter.    Immunization History  Administered Date(s) Administered  . Influenza-Unspecified 05/06/2014  . PPD Test 04/07/2014    History  Substance Use Topics  . Smoking status: Never Smoker   . Smokeless tobacco: Not on file  . Alcohol Use: No    Review of Systems  DATA OBTAINED: from nurse, medical record GENERAL:  no fevers, fatigue, appetite changes SKIN: No itching, rash HEENT: No complaint RESPIRATORY: No cough, wheezing, SOB CARDIAC: No chest pain, palpitations, lower extremity edema  GI: No abdominal pain, No N/V/D or constipation, No heartburn or reflux  GU: No dysuria, frequency or urgency, or incontinence  MUSCULOSKELETAL: occ arthritis pain NEUROLOGIC: No headache, dizziness  PSYCHIATRIC: No overt anxiety or sadness  Filed Vitals:   01/10/15 1420  BP: 118/80  Pulse: 71  Temp: 98.3 F (36.8 C)  Resp: 18    Physical Exam  GENERAL  APPEARANCE: Alert, conversant, No acute distress  SKIN: No diaphoresis rash HEENT: Unremarkable RESPIRATORY: Breathing is even, unlabored   CARDIOVASCULAR:  2+ peripheral edema  GASTROINTESTINAL: Abdomen is not distended .  GENITOURINARY: Bladder  not distended  MUSCULOSKELETAL: No abnormal joints or musculature NEUROLOGIC: Cranial nerves 2-12 grossly intact. PSYCHIATRIC: Mood and affect appropriate to situation, no behavioral issues  Patient Active Problem List   Diagnosis Date Noted  . Bilateral leg edema 12/06/2014  . CVA, old, ataxia 09/08/2014  . PVD (peripheral vascular disease) 09/08/2014  . Hyperlipidemia   . Muscle weakness of right upper extremity 08/29/2014  . Pneumonia 05/13/2014  . Pain in joint, ankle and foot 05/13/2014  . UTI (lower urinary tract infection) 03/15/2014  . Hypothyroidism 02/23/2007  . Anemia, B12 deficiency 02/23/2007  . H/O anxiety disorder 02/23/2007  . Hypertensive heart/kidney disease without HF and with CKD stage III 02/23/2007  . DYSRHYTHMIA, CARDIAC NOS 02/23/2007  . KIDNEY DISEASE, CHRONIC, STAGE III 02/23/2007  . FX CLOSED ANKLE NOS 02/23/2007    CBC    Component Value Date/Time   WBC 5.7 09/04/2014   WBC 10.5 03/31/2014 2127   RBC 3.70* 03/31/2014 2127   HGB 11.8* 09/04/2014   HCT 35* 09/04/2014   PLT 217 09/04/2014   MCV 100.3* 03/31/2014 2127   LYMPHSABS 0.9 03/31/2014 2127   MONOABS 1.7* 03/31/2014 2127   EOSABS 0.2 03/31/2014 2127   BASOSABS 0.0 03/31/2014 2127    CMP     Component Value Date/Time   NA 137 11/19/2014   NA 134* 03/31/2014 2127   K 4.4 11/19/2014   CL 94* 03/31/2014 2127   CO2 27 03/31/2014 2127   GLUCOSE 125* 03/31/2014 2127   BUN 27* 11/19/2014   BUN 18 03/31/2014 2127   CREATININE 1.01 03/31/2014 2127   CALCIUM 9.8 03/31/2014 2127   PROT 6.8 03/31/2014 2127   ALBUMIN 2.8* 03/31/2014 2127   AST 13 09/04/2014   ALT 8 09/04/2014   ALKPHOS 85 09/04/2014   BILITOT 0.7 03/31/2014 2127   GFRNONAA  48* 03/31/2014 2127   GFRAA 56* 03/31/2014 2127    Assessment and Plan  Hypothyroidism In 5/27 TSH -0.282; no signs or sx of hyperthyroid;Plan - dec synthroid from 188 mcg to 150 mcg daily    Margit Hanks, MD

## 2015-01-10 NOTE — Assessment & Plan Note (Signed)
In 5/27 TSH -0.282; no signs or sx of hyperthyroid;Plan - dec synthroid from 188 mcg to 150 mcg daily

## 2015-02-13 ENCOUNTER — Encounter: Payer: Self-pay | Admitting: Internal Medicine

## 2015-02-13 ENCOUNTER — Non-Acute Institutional Stay (SKILLED_NURSING_FACILITY): Payer: Medicare Other | Admitting: Internal Medicine

## 2015-02-13 DIAGNOSIS — E785 Hyperlipidemia, unspecified: Secondary | ICD-10-CM

## 2015-02-13 DIAGNOSIS — N183 Chronic kidney disease, stage 3 unspecified: Secondary | ICD-10-CM

## 2015-02-13 DIAGNOSIS — I1310 Hypertensive heart and chronic kidney disease without heart failure, with stage 1 through stage 4 chronic kidney disease, or unspecified chronic kidney disease: Secondary | ICD-10-CM | POA: Diagnosis not present

## 2015-02-13 DIAGNOSIS — I739 Peripheral vascular disease, unspecified: Secondary | ICD-10-CM

## 2015-02-13 NOTE — Progress Notes (Signed)
MRN: [id number] Name: Kristine [name]  Sex: female Age: 81 y.o. DOB: [birth year]  [facility]: [facility] Facility/Room: Level Of Care: SNF Provider: Merrilee Seashore D Emergency Contacts: Extended Emergency Contact Information Primary Emergency Contact: Eke,Nancy Address: [address]          [place] [address] [place] Home Phone: [phone] Work Phone: [phone] Mobile Phone: [phone] Relation: Relative Secondary Emergency Contact: Godman,Bill Address: [address]          [place], [place] [address] [place] Home Phone: [phone] Mobile Phone: [phone] Relation: Son  Code Status: FULL  Allergies: Amitriptyline; Buspar; Codeine; Dyazide; and Macrolides and ketolides  Chief Complaint  Patient presents with  . Medical Management of Chronic Issues    HPI: Patient is 81 y.o. female who is being seen for routine issues of HTN, hyperlipidemia and PVD.  Past Medical History  Diagnosis Date  . Hypertension   . Bilateral leg edema   . Hyperlipidemia     Past Surgical History  Procedure Laterality Date  . Ankle fracture surgery    . Cholecystectomy        Medication List       This list is accurate as of: 02/13/15  8:49 PM.  Always use your most recent med list.               acetaminophen 500 MG tablet  Commonly known as:  TYLENOL  Take 500 mg by mouth every 6 (six) hours as needed for mild pain.     amLODipine 2.5 MG tablet  Commonly known as:  NORVASC  Take 2.5 mg by mouth daily.     aspirin 81 MG chewable tablet  Chew 81 mg by mouth daily.     carboxymethylcellulose 0.5 % Soln  Commonly known as:  REFRESH PLUS  Place 1 drop into both eyes 2 (two) times daily as needed.     carvedilol 25 MG tablet  Commonly known as:  COREG  Take 25 mg by mouth 2 (two) times daily with a meal. For HTN     escitalopram 10 MG tablet  Commonly known as:  LEXAPRO  Take 10 mg by mouth daily. For depression     feeding  supplement (ENSURE COMPLETE) Liqd  Take 237 mLs by mouth 2 (two) times daily between meals.     furosemide 40 MG tablet  Commonly known as:  LASIX  Take 40 mg by mouth 2 (two) times a week. Saturday and Sunday     irbesartan 300 MG tablet  Commonly known as:  AVAPRO  Take 300 mg by mouth daily. For HTN     levothyroxine 100 MCG tablet  Commonly known as:  SYNTHROID, LEVOTHROID  Take 100 mcg by mouth daily before breakfast.     levothyroxine 88 MCG tablet  Commonly known as:  SYNTHROID, LEVOTHROID  Take 88 mcg by mouth daily before breakfast. With 100  Mcg + 188 mcg     traMADol 50 MG tablet  Commonly known as:  ULTRAM  Take one tablet by mouth every 12 hours as needed for moderate pain     vitamin B-12 1000 MCG tablet  Commonly known as:  CYANOCOBALAMIN  Take 1,000 mcg by mouth daily.     Vitamin D3 50000 UNITS Caps  Take 1 capsule by mouth every 30 (thirty) days.        No orders of the defined types were placed in this encounter.    Immunization History  Administered Date(s) Administered  . Influenza-Unspecified 05/06/2014  . PPD Test 04/07/2014    History  Substance Use Topics  . Smoking status: Never Smoker   . Smokeless tobacco: Not on file  . Alcohol Use: No    Review of Systems  DATA OBTAINED: from patient, nurse GENERAL:  no fevers, fatigue, appetite changes SKIN: No itching, rash HEENT: No complaint RESPIRATORY: No cough, wheezing, SOB CARDIAC: No chest pain, palpitations, lower extremity edema  GI: No abdominal pain, No N/V/D or constipation, No heartburn or reflux  GU: No dysuria, frequency or urgency, or incontinence  MUSCULOSKELETAL:chronic LE pain relieved with meds NEUROLOGIC: No headache, dizziness  PSYCHIATRIC: No overt anxiety or sadness  Filed Vitals:   02/13/15 2015  BP: 118/68  Pulse: 71  Temp: 97.9 F (36.6 C)  Resp: 18    Physical Exam  GENERAL APPEARANCE: Alert, conversant, No acute distress WF in WC  SKIN: No  diaphoresis rash, or wounds HEENT:nares nl, EOMI RESPIRATORY: Breathing is even, unlabored. Lung sounds are clear   CARDIOVASCULAR: Heart RRR no murmurs, rubs or gallops. No peripheral edema  GASTROINTESTINAL: Abdomen is  not distended .  GENITOURINARY: Bladder non tender, not distended  MUSCULOSKELETAL: No abnormal joints or musculature NEUROLOGIC: Cranial nerves 2-12 grossly intact. Moves all extremities PSYCHIATRIC: Mood and affect appropriate to situation, no behavioral issues  Patient Active Problem List   Diagnosis Date Noted  . Bilateral leg edema 12/06/2014  . CVA, old, ataxia 09/08/2014  . PVD (peripheral vascular disease) 09/08/2014  . Hyperlipidemia   . Muscle weakness of right upper extremity 08/29/2014  . Pneumonia 05/13/2014  . Pain in joint, ankle and foot 05/13/2014  . UTI (lower urinary tract infection) 03/15/2014  . Hypothyroidism 02/23/2007  . Anemia, B12 deficiency 02/23/2007  . H/O anxiety disorder 02/23/2007  . Hypertensive heart/kidney disease without HF and with CKD stage III 02/23/2007  . DYSRHYTHMIA, CARDIAC NOS 02/23/2007  . KIDNEY DISEASE, CHRONIC, STAGE III 02/23/2007  . FX CLOSED ANKLE NOS 02/23/2007    CBC    Component Value Date/Time   WBC 5.7 09/04/2014   WBC 10.5 03/31/2014 2127   RBC 3.70* 03/31/2014 2127   HGB 11.8* 09/04/2014   HCT 35* 09/04/2014   PLT 217 09/04/2014   MCV 100.3* 03/31/2014 2127   LYMPHSABS 0.9 03/31/2014 2127   MONOABS 1.7* 03/31/2014 2127   EOSABS 0.2 03/31/2014 2127   BASOSABS 0.0 03/31/2014 2127    CMP     Component Value Date/Time   NA 137 11/19/2014   NA 134* 03/31/2014 2127   K 4.4 11/19/2014   CL 94* 03/31/2014 2127   CO2 27 03/31/2014 2127   GLUCOSE 125* 03/31/2014 2127   BUN 27* 11/19/2014   BUN 18 03/31/2014 2127   CREATININE 1.01 03/31/2014 2127   CALCIUM 9.8 03/31/2014 2127   PROT 6.8 03/31/2014 2127   ALBUMIN 2.8* 03/31/2014 2127   AST 13 09/04/2014   ALT 8 09/04/2014   ALKPHOS 85  09/04/2014   BILITOT 0.7 03/31/2014 2127   GFRNONAA 48* 03/31/2014 2127   GFRAA 56* 03/31/2014 2127    Assessment and Plan  PVD (peripheral vascular disease) No peripheral wounds but ongoing pain, chronic; Plan - continue ASA as antiplatelet, and tylenol and ultram for pain, statin has been d/c 2/2 age and no ins coverage  Hypertensive heart/kidney disease without HF and with CKD stage III Chronic and stable on generous regimen; Plan - continue norvasc 2.5 mg, coreg 25 mg BID, avapro 300 mg daily  and lasix bi weekly  Hyperlipidemia Pt preferred Crestor but ins won't cover; hwever 2/2 age and difficulty with changing agents it was decided to D/c statin; last LDL was 72, HDL39; Plan - d./c crestor and repeat FLP in several months    Margit Hanks, MD

## 2015-02-13 NOTE — Assessment & Plan Note (Signed)
Pt preferred Crestor but ins won't cover; hwever 2/2 age and difficulty with changing agents it was decided to D/c statin; last LDL was 72, HDL39; Plan - d./c crestor and repeat FLP in several months

## 2015-02-13 NOTE — Assessment & Plan Note (Addendum)
No peripheral wounds but ongoing pain, chronic; Plan - continue ASA as antiplatelet, and tylenol and ultram for pain, statin has been d/c 2/2 age and no ins coverage

## 2015-02-13 NOTE — Assessment & Plan Note (Signed)
Chronic and stable on generous regimen; Plan - continue norvasc 2.5 mg, coreg 25 mg BID, avapro 300 mg daily and lasix bi weekly

## 2015-03-18 ENCOUNTER — Encounter: Payer: Self-pay | Admitting: Internal Medicine

## 2015-03-18 ENCOUNTER — Non-Acute Institutional Stay (SKILLED_NURSING_FACILITY): Payer: Medicare Other | Admitting: Internal Medicine

## 2015-03-18 DIAGNOSIS — M150 Primary generalized (osteo)arthritis: Secondary | ICD-10-CM

## 2015-03-18 DIAGNOSIS — F329 Major depressive disorder, single episode, unspecified: Secondary | ICD-10-CM | POA: Diagnosis not present

## 2015-03-18 DIAGNOSIS — F32A Depression, unspecified: Secondary | ICD-10-CM | POA: Insufficient documentation

## 2015-03-18 DIAGNOSIS — E034 Atrophy of thyroid (acquired): Secondary | ICD-10-CM

## 2015-03-18 DIAGNOSIS — M1990 Unspecified osteoarthritis, unspecified site: Secondary | ICD-10-CM | POA: Insufficient documentation

## 2015-03-18 DIAGNOSIS — E038 Other specified hypothyroidism: Secondary | ICD-10-CM

## 2015-03-18 DIAGNOSIS — M159 Polyosteoarthritis, unspecified: Secondary | ICD-10-CM

## 2015-03-18 NOTE — Progress Notes (Signed)
MRN: [id number] Name: Kristine [name]  Sex: female Age: 81 y.o. DOB: [birth year]  [facility] Facility/Room: Level Of Care: SNF Provider: Merrilee Seashore D Emergency Contacts: Extended Emergency Contact Information Primary Emergency Contact: Noda,Nancy Address: [address]          [place] [address] [place] Home Phone: [phone] Work Phone: [phone] Mobile Phone: [phone] Relation: Relative Secondary Emergency Contact: Edmister,Bill Address: [address]          [place], [place] [address] [place] Home Phone: [phone] Mobile Phone: [phone] Relation: Son  Code Status: FULL  Allergies: Amitriptyline; Buspar; Codeine; Dyazide; and Macrolides and ketolides  Chief Complaint  Patient presents with  . Medical Management of Chronic Issues    HPI: Patient is 81 y.o. female who is being seen today for arthritis, depression and hypothyroidism.  Past Medical History  Diagnosis Date  . Hypertension   . Bilateral leg edema   . Hyperlipidemia     Past Surgical History  Procedure Laterality Date  . Ankle fracture surgery    . Cholecystectomy        Medication List       This list is accurate as of: 03/18/15  7:56 PM.  Always use your most recent med list.               acetaminophen 500 MG tablet  Commonly known as:  TYLENOL  Take 500 mg by mouth every 6 (six) hours as needed for mild pain.     amLODipine 2.5 MG tablet  Commonly known as:  NORVASC  Take 2.5 mg by mouth daily.     aspirin 81 MG chewable tablet  Chew 81 mg by mouth daily.     carboxymethylcellulose 0.5 % Soln  Commonly known as:  REFRESH PLUS  Place 1 drop into both eyes 2 (two) times daily as needed.     carvedilol 25 MG tablet  Commonly known as:  COREG  Take 25 mg by mouth 2 (two) times daily with a meal. For HTN     escitalopram 10 MG tablet  Commonly known as:  LEXAPRO  Take 10 mg by mouth daily. For depression     feeding  supplement (ENSURE COMPLETE) Liqd  Take 237 mLs by mouth 2 (two) times daily between meals.     furosemide 40 MG tablet  Commonly known as:  LASIX  Take 40 mg by mouth 2 (two) times a week. Saturday and Sunday     irbesartan 300 MG tablet  Commonly known as:  AVAPRO  Take 300 mg by mouth daily. For HTN     levothyroxine 100 MCG tablet  Commonly known as:  SYNTHROID, LEVOTHROID  Take 100 mcg by mouth daily before breakfast.     levothyroxine 88 MCG tablet  Commonly known as:  SYNTHROID, LEVOTHROID  Take 88 mcg by mouth daily before breakfast. With 100  Mcg + 188 mcg     traMADol 50 MG tablet  Commonly known as:  ULTRAM  Take one tablet by mouth every 12 hours as needed for moderate pain     vitamin B-12 1000 MCG tablet  Commonly known as:  CYANOCOBALAMIN  Take 1,000 mcg by mouth daily.     Vitamin D3 50000 UNITS Caps  Take 1 capsule by mouth every 30 (thirty) days.        No orders of the defined types were placed in this encounter.    Immunization History  Administered Date(s)  Administered  . Influenza-Unspecified 05/06/2014  . PPD Test 04/07/2014    Social History  Substance Use Topics  . Smoking status: Never Smoker   . Smokeless tobacco: Not on file  . Alcohol Use: No    Review of Systems  DATA OBTAINED: from patient, nurse GENERAL:  no fevers, fatigue, appetite changes SKIN: No itching, rash HEENT: No complaint RESPIRATORY: No cough, wheezing, SOB CARDIAC: No chest pain, palpitations, lower extremity edema  GI: No abdominal pain, No N/V/D or constipation, No heartburn or reflux  GU: No dysuria, frequency or urgency, or incontinence  MUSCULOSKELETAL: No unrelieved bone/joint pain NEUROLOGIC: No headache, dizziness  PSYCHIATRIC: No overt anxiety or sadness  Filed Vitals:   03/18/15 1948  BP: 118/68  Pulse: 69  Temp: 97 F (36.1 C)  Resp: 18    Physical Exam  GENERAL APPEARANCE: Alert, conversant, No acute distress, in hall in [facility]  SKIN: No  diaphoresis rash HEENT: Unremarkable RESPIRATORY: Breathing is even, unlabored. Lung sounds are clear   CARDIOVASCULAR: Heart RRR no murmurs, rubs or gallops. No peripheral edema  GASTROINTESTINAL: Abdomen is soft, non-tender, not distended   GENITOURINARY: Bladder non tender, not distended  MUSCULOSKELETAL: No abnormal joints or musculature NEUROLOGIC: Cranial nerves 2-12 grossly intact. Moves all extremities PSYCHIATRIC: Mood and affect appropriate to situation, no behavioral issues  Patient Active Problem List   Diagnosis Date Noted  . Osteoarthritis 03/18/2015  . Depression 03/18/2015  . Bilateral leg edema 12/06/2014  . CVA, old, ataxia 09/08/2014  . PVD (peripheral vascular disease) 09/08/2014  . Hyperlipidemia   . Muscle weakness of right upper extremity 08/29/2014  . Pneumonia 05/13/2014  . Pain in joint, ankle and foot 05/13/2014  . UTI (lower urinary tract infection) 03/15/2014  . Hypothyroidism 02/23/2007  . Anemia, B12 deficiency 02/23/2007  . H/O anxiety disorder 02/23/2007  . Hypertensive heart/kidney disease without HF and with CKD stage III 02/23/2007  . DYSRHYTHMIA, CARDIAC NOS 02/23/2007  . KIDNEY DISEASE, CHRONIC, STAGE III 02/23/2007  . FX CLOSED ANKLE NOS 02/23/2007    CBC    Component Value Date/Time   WBC 5.7 09/04/2014   WBC 10.5 03/31/2014 2127   RBC 3.70* 03/31/2014 2127   HGB 11.8* 09/04/2014   HCT 35* 09/04/2014   PLT 217 09/04/2014   MCV 100.3* 03/31/2014 2127   LYMPHSABS 0.9 03/31/2014 2127   MONOABS 1.7* 03/31/2014 2127   EOSABS 0.2 03/31/2014 2127   BASOSABS 0.0 03/31/2014 2127    CMP     Component Value Date/Time   NA 137 11/19/2014   NA 134* 03/31/2014 2127   K 4.4 11/19/2014   CL 94* 03/31/2014 2127   CO2 27 03/31/2014 2127   GLUCOSE 125* 03/31/2014 2127   BUN 27* 11/19/2014   BUN 18 03/31/2014 2127   CREATININE 1.01 03/31/2014 2127   CALCIUM 9.8 03/31/2014 2127   PROT 6.8 03/31/2014 2127   ALBUMIN 2.8* 03/31/2014 2127    AST 13 09/04/2014   ALT 8 09/04/2014   ALKPHOS 85 09/04/2014   BILITOT 0.7 03/31/2014 2127   GFRNONAA 48* 03/31/2014 2127   GFRAA 56* 03/31/2014 2127    Assessment and Plan  Osteoarthritis B knees;pt eval for tx to tx; Plan - PT, scheduled tylenol, inc tramadol 50 mg to TID  Hypothyroidism Chronic and better, with dec to 150 mcg TSH in 01/2015 3.69; Plan - coon synthroid 150 mg daily  Depression Improved on lexapro; Plan cont lexapro 10 mg    Margit Hanks, MD

## 2015-03-18 NOTE — Assessment & Plan Note (Signed)
Improved on lexapro; Plan cont lexapro 10 mg

## 2015-03-18 NOTE — Assessment & Plan Note (Signed)
B knees;pt eval for tx to tx; Plan - PT, scheduled tylenol, inc tramadol 50 mg to TID

## 2015-03-18 NOTE — Assessment & Plan Note (Signed)
Chronic and better, with dec to 150 mcg TSH in 01/2015 3.69; Plan - coon synthroid 150 mg daily

## 2015-04-11 ENCOUNTER — Non-Acute Institutional Stay (SKILLED_NURSING_FACILITY): Payer: Medicare Other | Admitting: Internal Medicine

## 2015-04-11 DIAGNOSIS — N183 Chronic kidney disease, stage 3 unspecified: Secondary | ICD-10-CM

## 2015-04-11 DIAGNOSIS — D519 Vitamin B12 deficiency anemia, unspecified: Secondary | ICD-10-CM

## 2015-04-11 DIAGNOSIS — D509 Iron deficiency anemia, unspecified: Secondary | ICD-10-CM | POA: Diagnosis not present

## 2015-04-11 NOTE — Progress Notes (Signed)
MRN: [id number] Name: Kristine [name]  Sex: female Age: 81 y.o. DOB: Feb 05, 1924  [facility] #: [facility] Facility/Room: Level Of Care: SNF Provider: Merrilee Seashore D Emergency Contacts: Extended Emergency Contact Information Primary Emergency Contact: Caswell,Nancy Address: [address]           [address] [place] Home Phone: [phone] Work Phone: [phone] Mobile Phone: [phone] Relation: Relative Secondary Emergency Contact: Bogue,Bill Address: [address]          [place], [place] [address] [place] Home Phone: [phone] Mobile Phone: [phone] Relation: Son  Code Status: FULL  Allergies: Amitriptyline; Buspar; Codeine; Dyazide; and Macrolides and ketolides  Chief Complaint  Patient presents with  . Medical Management of Chronic Issues    HPI: Patient is 81 y.o. female with hx H, prior CVATN, HLD, anemia who is being seen for routine B12, def, iron def anemia and CKD 3.  Past Medical History  Diagnosis Date  . Hypertension   . Bilateral leg edema   . Hyperlipidemia     Past Surgical History  Procedure Laterality Date  . Ankle fracture surgery    . Cholecystectomy        Medication List       This list is accurate as of: 04/11/15 11:59 PM.  Always use your most recent med list.               acetaminophen 500 MG tablet  Commonly known as:  TYLENOL  Take 500 mg by mouth every 6 (six) hours as needed for mild pain.     amLODipine 2.5 MG tablet  Commonly known as:  NORVASC  Take 2.5 mg by mouth daily.     aspirin 81 MG chewable tablet  Chew 81 mg by mouth daily.     carboxymethylcellulose 0.5 % Soln  Commonly known as:  REFRESH PLUS  Place 1 drop into both eyes 2 (two) times daily as needed.     carvedilol 25 MG tablet  Commonly known as:  COREG  Take 25 mg by mouth 2 (two) times daily with a meal. For HTN     escitalopram 10 MG tablet  Commonly known as:  LEXAPRO  Take 10 mg by mouth daily.  For depression     feeding supplement (ENSURE COMPLETE) Liqd  Take 237 mLs by mouth 2 (two) times daily between meals.     furosemide 40 MG tablet  Commonly known as:  LASIX  Take 40 mg by mouth 2 (two) times a week. Saturday and Sunday     irbesartan 300 MG tablet  Commonly known as:  AVAPRO  Take 300 mg by mouth daily. For HTN     levothyroxine 100 MCG tablet  Commonly known as:  SYNTHROID, LEVOTHROID  Take 100 mcg by mouth daily before breakfast.     levothyroxine 88 MCG tablet  Commonly known as:  SYNTHROID, LEVOTHROID  Take 88 mcg by mouth daily before breakfast. With 100  Mcg + 188 mcg     traMADol 50 MG tablet  Commonly known as:  ULTRAM  Take one tablet by mouth every 12 hours as needed for moderate pain     Vitamin D3 50000 UNITS Caps  Take 1 capsule by mouth every 30 (thirty) days.        No orders of the defined types were placed in this encounter.    Immunization History  Administered Date(s) Administered  . Influenza-Unspecified 05/06/2014  . PPD Test 04/07/2014  Social History  Substance Use Topics  . Smoking status: Never Smoker   . Smokeless tobacco: Not on file  . Alcohol Use: No    Review of Systems  DATA OBTAINED: from nurse- no changes, very stable GENERAL:  no fevers, fatigue, appetite changes SKIN: No itching, rash HEENT: No complaint RESPIRATORY: No cough, wheezing, SOB CARDIAC: No chest pain, palpitations, lower extremity edema  GI: No abdominal pain, No N/V/D or constipation, No heartburn or reflux  GU: No dysuria, frequency or urgency, or incontinence  MUSCULOSKELETAL: No unrelieved bone/joint pain NEUROLOGIC: No headache, dizziness  PSYCHIATRIC: No overt anxiety or sadness  Filed Vitals:   04/12/15 1503  BP: 133/72  Pulse: 70  Temp: 98.2 F (36.8 C)  Resp: 18    Physical Exam  GENERAL APPEARANCE: Alert, conversant, No acute distress ;in WC in hall SKIN: No diaphoresis rash HEENT: Unremarkable RESPIRATORY:  Breathing is even, unlabored.   CARDIOVASCULAR: Heart RRR no murmurs, rubs or gallops. + non pitting peripheral edema  GASTROINTESTINAL: Abdomen is soft, non-tender, not distended.  GENITOURINARY: Bladder non tender, not distended  MUSCULOSKELETAL: No abnormal joints or musculature NEUROLOGIC: Cranial nerves 2-12 grossly intact. Moves all extremities PSYCHIATRIC: Mood and affect appropriate to situation, no behavioral issues  Patient Active Problem List   Diagnosis Date Noted  . Iron deficiency anemia 04/12/2015  . Osteoarthritis 03/18/2015  . Depression 03/18/2015  . Bilateral leg edema 12/06/2014  . CVA, old, ataxia 09/08/2014  . PVD (peripheral vascular disease) 09/08/2014  . Hyperlipidemia   . Muscle weakness of right upper extremity 08/29/2014  . Pneumonia 05/13/2014  . Pain in joint, ankle and foot 05/13/2014  . UTI (lower urinary tract infection) 03/15/2014  . Hypothyroidism 02/23/2007  . Anemia, B12 deficiency 02/23/2007  . H/O anxiety disorder 02/23/2007  . Hypertensive heart/kidney disease without HF and with CKD stage III 02/23/2007  . DYSRHYTHMIA, CARDIAC NOS 02/23/2007  . KIDNEY DISEASE, CHRONIC, STAGE III 02/23/2007  . FX CLOSED ANKLE NOS 02/23/2007    CBC    Component Value Date/Time   WBC 5.7 09/04/2014   WBC 10.5 03/31/2014 2127   RBC 3.70* 03/31/2014 2127   HGB 11.8* 09/04/2014   HCT 35* 09/04/2014   PLT 217 09/04/2014   MCV 100.3* 03/31/2014 2127   LYMPHSABS 0.9 03/31/2014 2127   MONOABS 1.7* 03/31/2014 2127   EOSABS 0.2 03/31/2014 2127   BASOSABS 0.0 03/31/2014 2127    CMP     Component Value Date/Time   NA 137 11/19/2014   NA 134* 03/31/2014 2127   K 4.4 11/19/2014   CL 94* 03/31/2014 2127   CO2 27 03/31/2014 2127   GLUCOSE 125* 03/31/2014 2127   BUN 27* 11/19/2014   BUN 18 03/31/2014 2127   CREATININE 1.01 03/31/2014 2127   CALCIUM 9.8 03/31/2014 2127   PROT 6.8 03/31/2014 2127   ALBUMIN 2.8* 03/31/2014 2127   AST 13 09/04/2014   ALT  8 09/04/2014   ALKPHOS 85 09/04/2014   BILITOT 0.7 03/31/2014 2127   GFRNONAA 48* 03/31/2014 2127   GFRAA 56* 03/31/2014 2127    Assessment and Plan  Anemia, B12 deficiency 02/2015 B12-1401; pt no longer with b12 def ;Plan - d/c B12 supplements  Iron deficiency anemia In 02/2015 iron - 48. Sat 18 %; 01/2015 Hb 11.7 which is stable ;Plan - iron started last month 325 mg daily; repeat CBC 3 months  KIDNEY DISEASE, CHRONIC, STAGE III 02/2015 Cr Cl 47-59, Cr 1.0 to 1.27, stable; Plan - cont periodic monitoring  Hennie Duos, MD

## 2015-04-12 ENCOUNTER — Encounter: Payer: Self-pay | Admitting: Internal Medicine

## 2015-04-12 DIAGNOSIS — D509 Iron deficiency anemia, unspecified: Secondary | ICD-10-CM | POA: Insufficient documentation

## 2015-04-12 NOTE — Assessment & Plan Note (Signed)
In 02/2015 iron - 48. Sat 18 %; 01/2015 Hb 11.7 which is stable ;Plan - iron started last month 325 mg daily; repeat CBC 3 months

## 2015-04-12 NOTE — Assessment & Plan Note (Addendum)
02/2015 Cr Cl 47-59, Cr 1.0 to 1.27, stable; Plan - cont periodic monitoring

## 2015-04-12 NOTE — Assessment & Plan Note (Signed)
02/2015 B12-1401; pt no longer with b12 def ;Plan - d/c B12 supplements

## 2015-04-24 IMAGING — CR DG PELVIS 1-2V
1 series · 1 of 1 positions shown · non-contrast
Comparison: 03/15/2014

CLINICAL DATA: Upper and lower back pain after a fall.

EXAM:
PELVIS - 1-2 VIEW

[t pelvis ap]
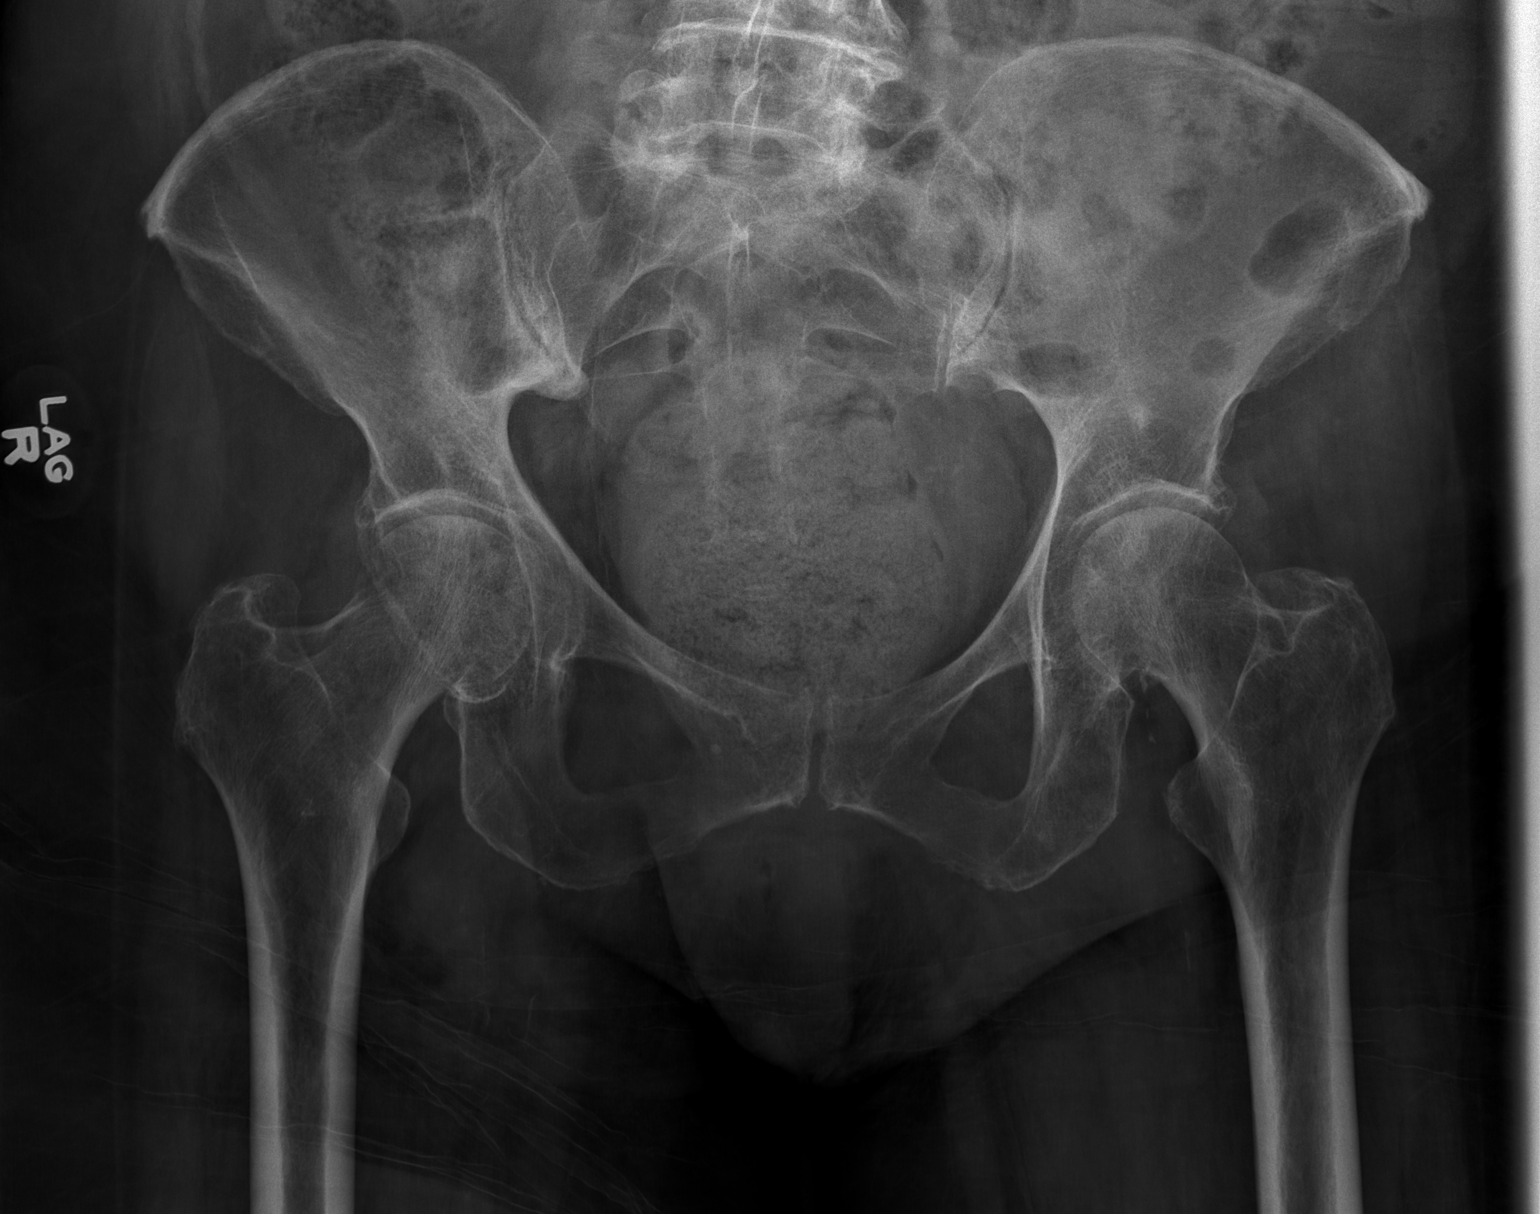

[1 of 1 positions shown; findings below may reference images not displayed]

FINDINGS: Diffuse bone demineralization. Pelvis and hips appear intact. No
displaced fractures are identified. Degenerative changes noted in
the lower lumbar spine, both hips, an both SI joints.
IMPRESSION: Degenerative changes.  No displaced fractures identified.

## 2015-04-24 IMAGING — CR DG THORACIC SPINE 2V
2 series · 2 of 2 positions shown · non-contrast
Comparison: Chest 03/15/2014

CLINICAL DATA: Upper and lower back pain after a fall.

EXAM:
THORACIC SPINE - 2 VIEW

[t thoracic spine ap]
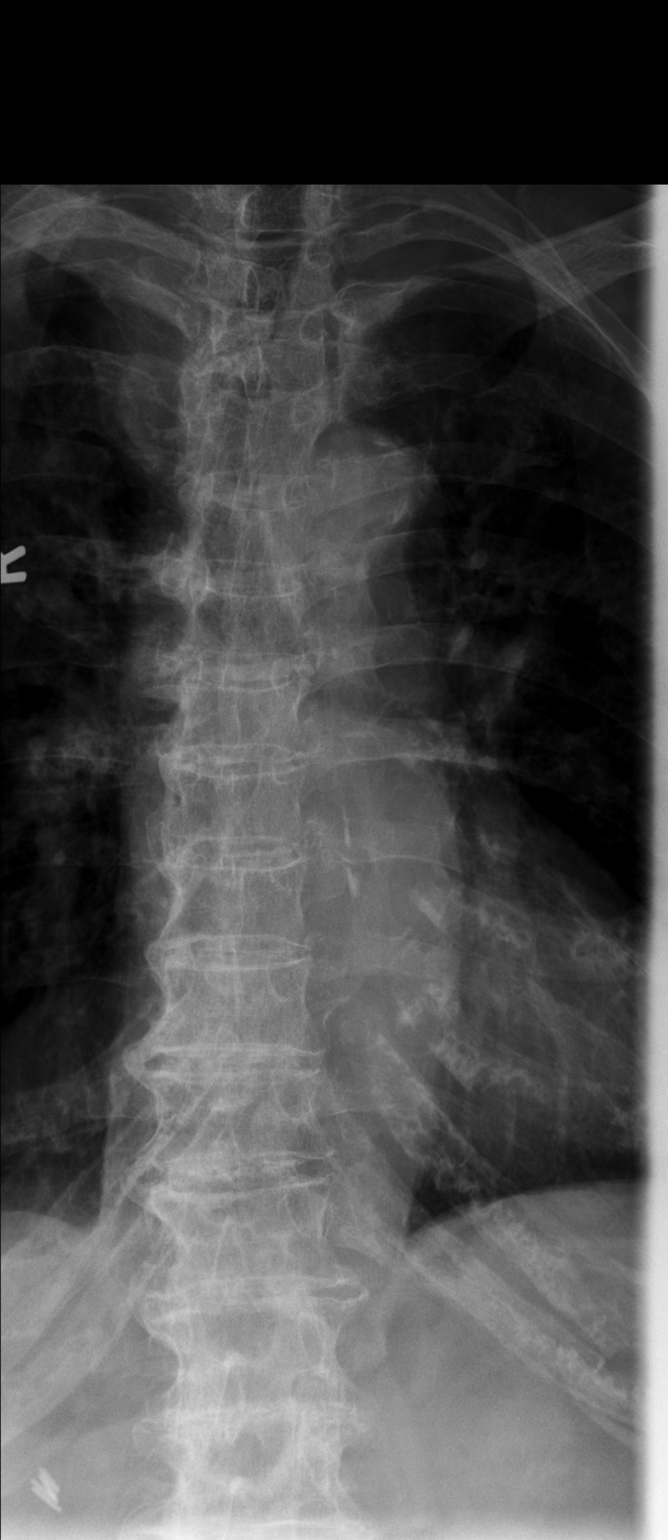

[w thoracic spine lat]
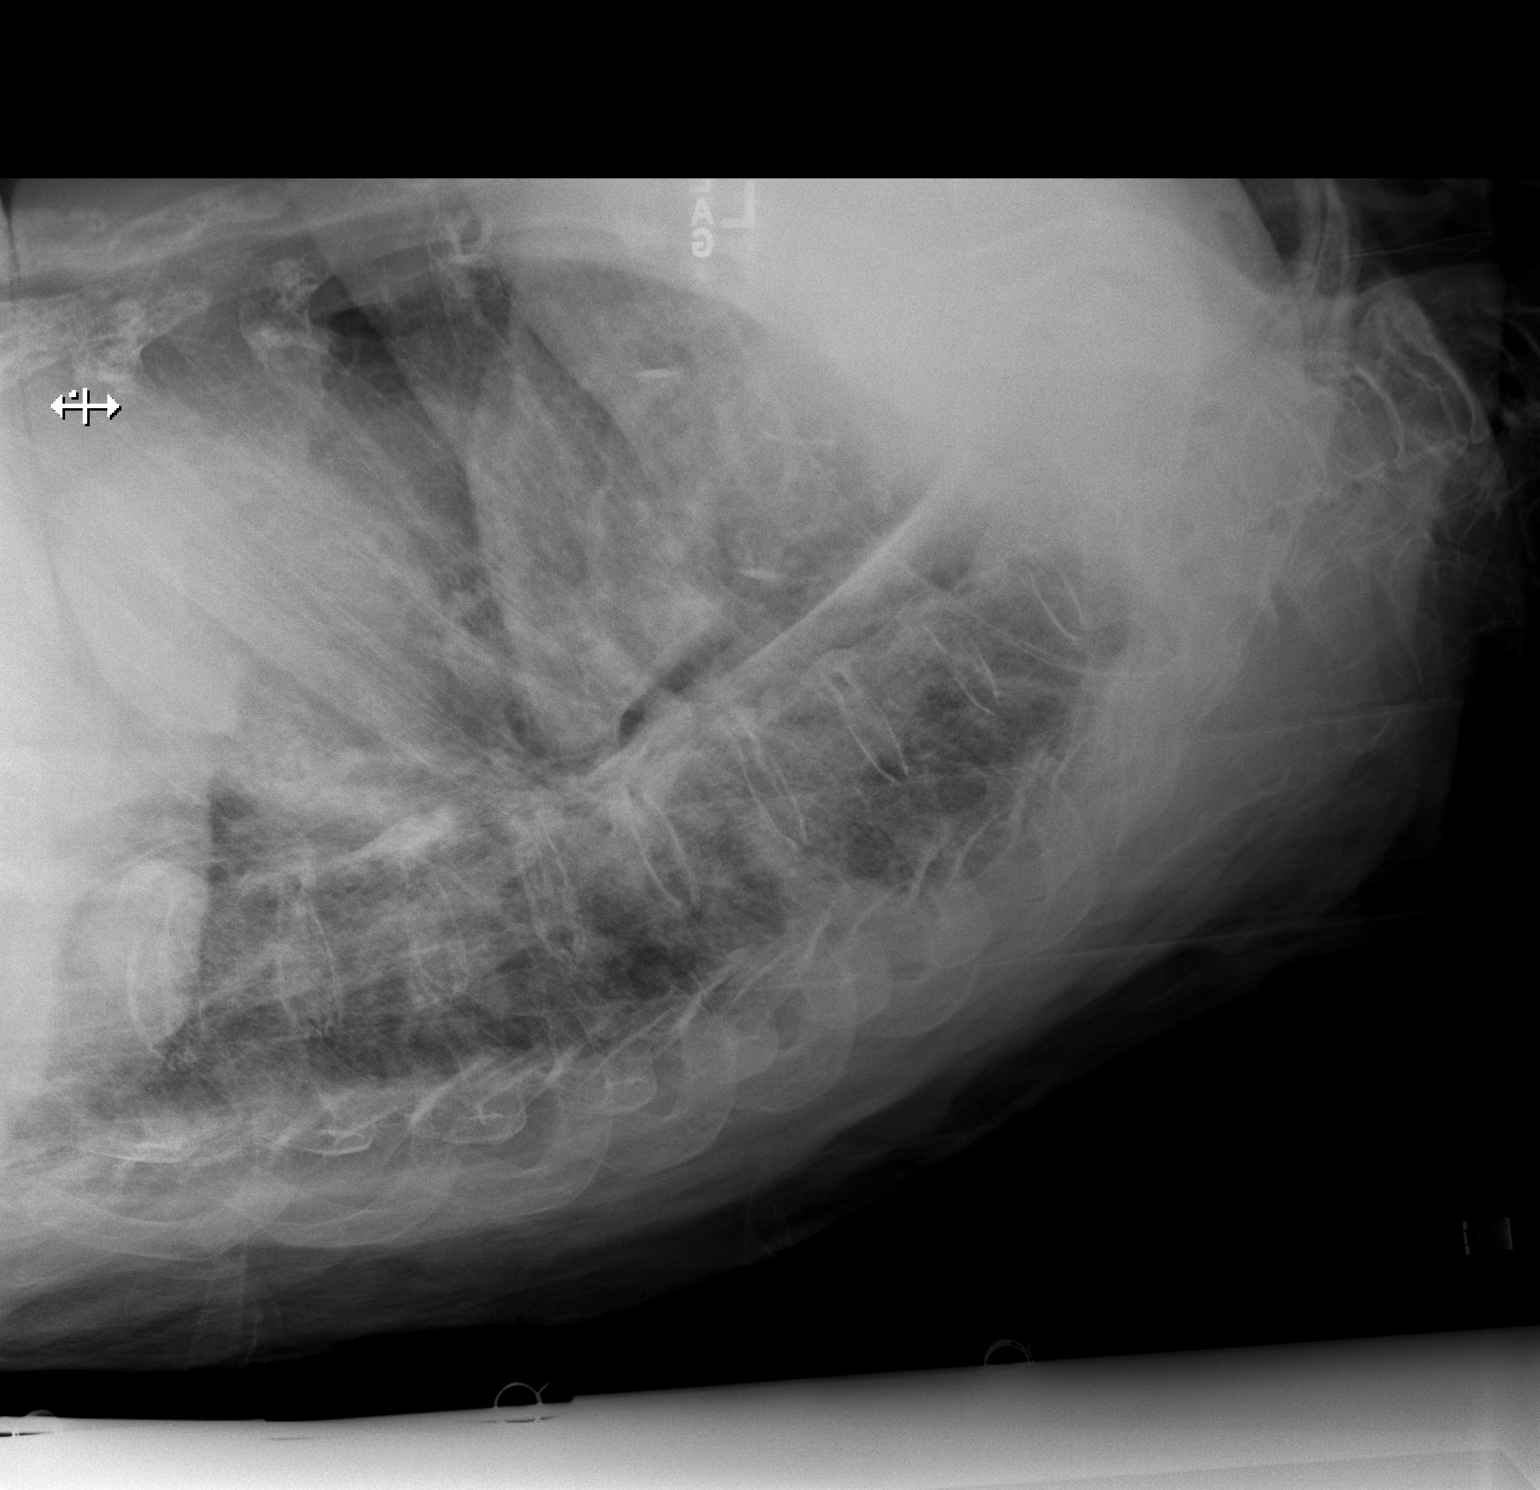

[2 of 2 positions shown; findings below may reference images not displayed]

FINDINGS: Diffuse bone demineralization. Diffuse degenerative change
throughout the thoracic spine with narrowed interspaces and anterior
bridging osteophytes. Normal alignment. No anterior subluxation. No
vertebral compression deformities. No paraspinal soft tissue
swelling.
IMPRESSION: Diffuse degenerative change and demineralization of the thoracic
spine. No acute displaced fractures identified.

## 2015-06-03 ENCOUNTER — Non-Acute Institutional Stay (SKILLED_NURSING_FACILITY): Payer: Medicare Other | Admitting: Internal Medicine

## 2015-06-03 ENCOUNTER — Encounter: Payer: Self-pay | Admitting: Internal Medicine

## 2015-06-03 DIAGNOSIS — N183 Chronic kidney disease, stage 3 unspecified: Secondary | ICD-10-CM

## 2015-06-03 DIAGNOSIS — I1310 Hypertensive heart and chronic kidney disease without heart failure, with stage 1 through stage 4 chronic kidney disease, or unspecified chronic kidney disease: Secondary | ICD-10-CM

## 2015-06-03 DIAGNOSIS — D692 Other nonthrombocytopenic purpura: Secondary | ICD-10-CM | POA: Diagnosis not present

## 2015-06-03 NOTE — Progress Notes (Signed)
MRN: [id number] Name: Kristine SalvageWilma O [name]  Sex: female Age: 81 y.o. DOB: [birth year]  [facility] Facility/Room: Level Of Care: SNF Provider: Merrilee SeashoreALEXANDER, Tayden Nichelson D Emergency Contacts: Extended Emergency Contact Information Primary Emergency Contact: Szafran,Nancy Address: [address]          [place] [id number] [place] Home Phone: [phone] Work Phone: [phone] Mobile Phone: [phone] Relation: Relative Secondary Emergency Contact: Camilo,Bill Address: [address]          [place], [place] [address] [place] Home Phone: [phone] Mobile Phone: [phone] Relation: Son  Code Status:   Allergies: Amitriptyline; Buspar; Codeine; Dyazide; and Macrolides and ketolides  Chief Complaint  Patient presents with  . Medical Management of Chronic Issues    HPI: Patient is 81 y.o. female with HTN, HLD CKD who is being seen for routine issues of HTN, CKD and senile purpura.  Past Medical History  Diagnosis Date  . Hypertension   . Bilateral leg edema   . Hyperlipidemia     Past Surgical History  Procedure Laterality Date  . Ankle fracture surgery    . Cholecystectomy        Medication List       This list is accurate as of: 06/03/15 11:59 PM.  Always use your most recent med list.               acetaminophen 500 MG tablet  Commonly known as:  TYLENOL  Take 500 mg by mouth every 6 (six) hours as needed for mild pain.     amLODipine 2.5 MG tablet  Commonly known as:  NORVASC  Take 2.5 mg by mouth daily.     aspirin 81 MG chewable tablet  Chew 81 mg by mouth daily.     carboxymethylcellulose 0.5 % Soln  Commonly known as:  REFRESH PLUS  Place 1 drop into both eyes 2 (two) times daily as needed.     carvedilol 25 MG tablet  Commonly known as:  COREG  Take 25 mg by mouth 2 (two) times daily with a meal. For HTN     escitalopram 10 MG tablet  Commonly known as:  LEXAPRO  Take 10 mg by mouth daily. For depression     feeding supplement (ENSURE COMPLETE) Liqd  Take 237 mLs by mouth 2 (two) times daily between meals.     furosemide 40 MG tablet  Commonly known as:  LASIX  Take 40 mg by mouth 2 (two) times a week. Saturday and Sunday     irbesartan 300 MG tablet  Commonly known as:  AVAPRO  Take 300 mg by mouth daily. For HTN     levothyroxine 100 MCG tablet  Commonly known as:  SYNTHROID, LEVOTHROID  Take 100 mcg by mouth daily before breakfast.     levothyroxine 88 MCG tablet  Commonly known as:  SYNTHROID, LEVOTHROID  Take 88 mcg by mouth daily before breakfast. With 100  Mcg + 188 mcg     traMADol 50 MG tablet  Commonly known as:  ULTRAM  Take one tablet by mouth every 12 hours as needed for moderate pain     Vitamin D3 50000 UNITS Caps  Take 1 capsule by mouth every 30 (thirty) days.        No orders of the defined types were placed in this encounter.    Immunization History  Administered Date(s) Administered  . Influenza-Unspecified 05/06/2014  . PPD Test 04/07/2014    Social History  Substance  Use Topics  . Smoking status: Never Smoker   . Smokeless tobacco: Not on file  . Alcohol Use: No    Review of Systems  DATA OBTAINED: from patient, nurse- no c/o or concerns GENERAL:  no fevers, fatigue, appetite changes SKIN: No itching, rash HEENT: No complaint RESPIRATORY: No cough, wheezing, SOB CARDIAC: No chest pain, palpitations, lower extremity edema  GI: No abdominal pain, No N/V/D or constipation, No heartburn or reflux  GU: No dysuria, frequency or urgency, or incontinence  MUSCULOSKELETAL: No unrelieved bone/joint pain NEUROLOGIC: No headache, dizziness  PSYCHIATRIC: No overt anxiety or sadness  Filed Vitals:   06/03/15 2149  BP: 139/65  Pulse: 71  Temp: 97.1 F (36.2 C)  Resp: 19    Physical Exam  GENERAL APPEARANCE: Alert, conversant, No acute distress, is out in hall in [facility] daily  SKIN: No diaphoresis rash, or wounds, multiple areas of bruising on B  arms, no swelling, redness or heat HEENT: Unremarkable RESPIRATORY: Breathing is even, unlabored. Lung sounds are clear   CARDIOVASCULAR: Heart RRR no murmurs, rubs or gallops. No peripheral edema  GASTROINTESTINAL: Abdomen is soft, non-tender, not distended w/ normal bowel sounds.  GENITOURINARY: Bladder non tender, not distended  MUSCULOSKELETAL: No abnormal joints or musculature NEUROLOGIC: Cranial nerves 2-12 grossly intact. Moves all extremities PSYCHIATRIC: Mood and affect appropriate to situation with dementia, no behavioral issues  Patient Active Problem List   Diagnosis Date Noted  . Senile purpura (HCC) 06/06/2015  . Iron deficiency anemia 04/12/2015  . Osteoarthritis 03/18/2015  . Depression 03/18/2015  . Bilateral leg edema 12/06/2014  . CVA, old, ataxia 09/08/2014  . PVD (peripheral vascular disease) (HCC) 09/08/2014  . Hyperlipidemia   . Muscle weakness of right upper extremity 08/29/2014  . Pneumonia 05/13/2014  . Pain in joint, ankle and foot 05/13/2014  . UTI (lower urinary tract infection) 03/15/2014  . Hypothyroidism 02/23/2007  . Anemia, B12 deficiency 02/23/2007  . H/O anxiety disorder 02/23/2007  . Hypertensive heart/kidney disease without HF and with CKD stage III 02/23/2007  . DYSRHYTHMIA, CARDIAC NOS 02/23/2007  . KIDNEY DISEASE, CHRONIC, STAGE III 02/23/2007  . FX CLOSED ANKLE NOS 02/23/2007    CBC    Component Value Date/Time   WBC 5.7 09/04/2014   WBC 10.5 03/31/2014 2127   RBC 3.70* 03/31/2014 2127   HGB 11.8* 09/04/2014   HCT 35* 09/04/2014   PLT 217 09/04/2014   MCV 100.3* 03/31/2014 2127   LYMPHSABS 0.9 03/31/2014 2127   MONOABS 1.7* 03/31/2014 2127   EOSABS 0.2 03/31/2014 2127   BASOSABS 0.0 03/31/2014 2127    CMP     Component Value Date/Time   NA 137 11/19/2014   NA 134* 03/31/2014 2127   K 4.4 11/19/2014   CL 94* 03/31/2014 2127   CO2 27 03/31/2014 2127   GLUCOSE 125* 03/31/2014 2127   BUN 27* 11/19/2014   BUN 18  03/31/2014 2127   CREATININE 1.01 03/31/2014 2127   CALCIUM 9.8 03/31/2014 2127   PROT 6.8 03/31/2014 2127   ALBUMIN 2.8* 03/31/2014 2127   AST 13 09/04/2014   ALT 8 09/04/2014   ALKPHOS 85 09/04/2014   BILITOT 0.7 03/31/2014 2127   GFRNONAA 48* 03/31/2014 2127   GFRAA 56* 03/31/2014 2127    Assessment and Plan  Hypertensive heart/kidney disease without HF and with CKD stage III Chronic and stable on  Multiple meds; cont irbesartan (AVAPRO) 300 MG tablet 300 mg, Daily furosemide (LASIX) 40 MG tablet 40 mg, 2 times weekly carvedilol (  COREG) 25 MG tablet 25 mg, 2 times daily with meals  amLODipine (NORVASC) 2.5 MG tablet daily   KIDNEY DISEASE, CHRONIC, STAGE III Chronic and stable - BUN/Cr in 03/2015 31/1.17; CrCl 50; will cont to monitor at intervals  Senile purpura (HCC) Chronic with bruising and skin tears without infection and pt not in pain;plan - cont to monitor    Margit Hanks, MD

## 2015-06-06 DIAGNOSIS — D692 Other nonthrombocytopenic purpura: Secondary | ICD-10-CM | POA: Insufficient documentation

## 2015-06-06 NOTE — Assessment & Plan Note (Signed)
Chronic and stable - BUN/Cr in 03/2015 31/1.17; CrCl 50; will cont to monitor at intervals

## 2015-06-06 NOTE — Assessment & Plan Note (Signed)
Chronic and stable on  Multiple meds; cont irbesartan (AVAPRO) 300 MG tablet 300 mg, Daily furosemide (LASIX) 40 MG tablet 40 mg, 2 times weekly carvedilol (COREG) 25 MG tablet 25 mg, 2 times daily with meals  amLODipine (NORVASC) 2.5 MG tablet daily

## 2015-06-06 NOTE — Assessment & Plan Note (Signed)
Chronic with bruising and skin tears without infection and pt not in pain;plan - cont to monitor

## 2015-06-13 ENCOUNTER — Inpatient Hospital Stay (HOSPITAL_COMMUNITY)
Admission: EM | Admit: 2015-06-13 | Discharge: 2015-06-15 | DRG: 689 | Disposition: A | Discharge status: Skilled Nursing Facility | Payer: Medicare Other | Attending: Internal Medicine | Admitting: Internal Medicine

## 2015-06-13 ENCOUNTER — Encounter (HOSPITAL_COMMUNITY): Payer: Self-pay | Admitting: Emergency Medicine

## 2015-06-13 ENCOUNTER — Emergency Department (HOSPITAL_COMMUNITY): Payer: Medicare Other

## 2015-06-13 DIAGNOSIS — B9620 Unspecified Escherichia coli [E. coli] as the cause of diseases classified elsewhere: Secondary | ICD-10-CM | POA: Diagnosis present

## 2015-06-13 DIAGNOSIS — Z79899 Other long term (current) drug therapy: Secondary | ICD-10-CM | POA: Diagnosis not present

## 2015-06-13 DIAGNOSIS — I1310 Hypertensive heart and chronic kidney disease without heart failure, with stage 1 through stage 4 chronic kidney disease, or unspecified chronic kidney disease: Secondary | ICD-10-CM | POA: Diagnosis present

## 2015-06-13 DIAGNOSIS — R627 Adult failure to thrive: Secondary | ICD-10-CM | POA: Diagnosis present

## 2015-06-13 DIAGNOSIS — N390 Urinary tract infection, site not specified: Principal | ICD-10-CM | POA: Diagnosis present

## 2015-06-13 DIAGNOSIS — G9340 Encephalopathy, unspecified: Secondary | ICD-10-CM | POA: Diagnosis present

## 2015-06-13 DIAGNOSIS — N179 Acute kidney failure, unspecified: Secondary | ICD-10-CM | POA: Diagnosis present

## 2015-06-13 DIAGNOSIS — N183 Chronic kidney disease, stage 3 unspecified: Secondary | ICD-10-CM | POA: Diagnosis present

## 2015-06-13 DIAGNOSIS — E039 Hypothyroidism, unspecified: Secondary | ICD-10-CM | POA: Diagnosis present

## 2015-06-13 DIAGNOSIS — Z8673 Personal history of transient ischemic attack (TIA), and cerebral infarction without residual deficits: Secondary | ICD-10-CM

## 2015-06-13 DIAGNOSIS — E860 Dehydration: Secondary | ICD-10-CM | POA: Diagnosis present

## 2015-06-13 DIAGNOSIS — Z6835 Body mass index (BMI) 35.0-35.9, adult: Secondary | ICD-10-CM

## 2015-06-13 DIAGNOSIS — Z9049 Acquired absence of other specified parts of digestive tract: Secondary | ICD-10-CM

## 2015-06-13 DIAGNOSIS — N289 Disorder of kidney and ureter, unspecified: Secondary | ICD-10-CM

## 2015-06-13 DIAGNOSIS — R600 Localized edema: Secondary | ICD-10-CM | POA: Diagnosis present

## 2015-06-13 DIAGNOSIS — R1310 Dysphagia, unspecified: Secondary | ICD-10-CM

## 2015-06-13 DIAGNOSIS — D519 Vitamin B12 deficiency anemia, unspecified: Secondary | ICD-10-CM | POA: Diagnosis present

## 2015-06-13 DIAGNOSIS — I10 Essential (primary) hypertension: Secondary | ICD-10-CM | POA: Diagnosis present

## 2015-06-13 DIAGNOSIS — R4182 Altered mental status, unspecified: Secondary | ICD-10-CM

## 2015-06-13 DIAGNOSIS — R531 Weakness: Secondary | ICD-10-CM | POA: Diagnosis not present

## 2015-06-13 DIAGNOSIS — N182 Chronic kidney disease, stage 2 (mild): Secondary | ICD-10-CM | POA: Diagnosis present

## 2015-06-13 LAB — COMPREHENSIVE METABOLIC PANEL
ALBUMIN: 3.5 g/dL (ref 3.5–5.0)
ALK PHOS: 79 U/L (ref 38–126)
ALT: 16 U/L (ref 14–54)
ANION GAP: 8 (ref 5–15)
AST: 20 U/L (ref 15–41)
BILIRUBIN TOTAL: 0.8 mg/dL (ref 0.3–1.2)
BUN: 30 mg/dL — AB (ref 6–20)
CALCIUM: 9.5 mg/dL (ref 8.9–10.3)
CO2: 29 mmol/L (ref 22–32)
CREATININE: 1.14 mg/dL — AB (ref 0.44–1.00)
Chloride: 103 mmol/L (ref 101–111)
GFR calc Af Amer: 48 mL/min — ABNORMAL LOW (ref >60)
GFR calc non Af Amer: 41 mL/min — ABNORMAL LOW (ref >60)
GLUCOSE: 105 mg/dL — AB (ref 65–99)
Potassium: 4.7 mmol/L (ref 3.5–5.1)
Sodium: 140 mmol/L (ref 135–145)
TOTAL PROTEIN: 6.7 g/dL (ref 6.5–8.1)

## 2015-06-13 LAB — CBC
HCT: 39.8 % (ref 36.0–46.0)
Hemoglobin: 13.1 g/dL (ref 12.0–15.0)
MCH: 34 pg (ref 26.0–34.0)
MCHC: 32.9 g/dL (ref 30.0–36.0)
MCV: 103.4 fL — AB (ref 78.0–100.0)
PLATELETS: 252 10*3/uL (ref 150–400)
RBC: 3.85 MIL/uL — AB (ref 3.87–5.11)
RDW: 13.1 % (ref 11.5–15.5)
WBC: 6 10*3/uL (ref 4.0–10.5)

## 2015-06-13 LAB — URINALYSIS, ROUTINE W REFLEX MICROSCOPIC
Bilirubin Urine: NEGATIVE
Glucose, UA: NEGATIVE mg/dL
KETONES UR: NEGATIVE mg/dL
Leukocytes, UA: NEGATIVE
NITRITE: POSITIVE — AB
Protein, ur: NEGATIVE mg/dL
Specific Gravity, Urine: 1.013 (ref 1.005–1.030)
pH: 7 (ref 5.0–8.0)

## 2015-06-13 LAB — PROTIME-INR
INR: 1.05 (ref 0.00–1.49)
Prothrombin Time: 13.9 seconds (ref 11.6–15.2)

## 2015-06-13 LAB — APTT: APTT: 32 s (ref 24–37)

## 2015-06-13 LAB — URINE MICROSCOPIC-ADD ON

## 2015-06-13 MED ORDER — OXYCODONE HCL 5 MG PO TABS
5.0000 mg | ORAL_TABLET | ORAL | Status: DC | PRN
  Administered 2015-06-14 – 2015-06-15 (×2): 5 mg via ORAL
  Filled 2015-06-13 (×3): qty 1

## 2015-06-13 MED ORDER — ONDANSETRON HCL 4 MG/2ML IJ SOLN: 4.0000 mg | Freq: Four times a day (QID) | INTRAMUSCULAR | Status: DC | PRN

## 2015-06-13 MED ORDER — ONDANSETRON HCL 4 MG PO TABS: 4.0000 mg | ORAL_TABLET | Freq: Four times a day (QID) | ORAL | Status: DC | PRN

## 2015-06-13 MED ORDER — CEFTRIAXONE SODIUM 1 G IJ SOLR
1.0000 g | Freq: Once | INTRAMUSCULAR | Status: AC
  Administered 2015-06-13: 1 g via INTRAVENOUS
  Filled 2015-06-13: qty 10

## 2015-06-13 MED ORDER — LEVOTHYROXINE SODIUM 50 MCG PO TABS
150.0000 ug | ORAL_TABLET | Freq: Every day | ORAL | Status: DC
  Administered 2015-06-14 – 2015-06-15 (×2): 150 ug via ORAL
  Filled 2015-06-13 (×2): qty 1

## 2015-06-13 MED ORDER — ENOXAPARIN SODIUM 60 MG/0.6ML ~~LOC~~ SOLN
50.0000 mg | Freq: Every day | SUBCUTANEOUS | Status: DC
  Administered 2015-06-14: 50 mg via SUBCUTANEOUS
  Filled 2015-06-13: qty 0.6

## 2015-06-13 MED ORDER — AMLODIPINE BESYLATE 5 MG PO TABS
2.5000 mg | ORAL_TABLET | Freq: Every day | ORAL | Status: DC
  Administered 2015-06-14 – 2015-06-15 (×2): 2.5 mg via ORAL
  Filled 2015-06-13 (×2): qty 1

## 2015-06-13 MED ORDER — CARVEDILOL 25 MG PO TABS
25.0000 mg | ORAL_TABLET | Freq: Two times a day (BID) | ORAL | Status: DC
  Administered 2015-06-13 – 2015-06-15 (×4): 25 mg via ORAL
  Filled 2015-06-13 (×4): qty 1

## 2015-06-13 MED ORDER — ACETAMINOPHEN 650 MG RE SUPP: 650.0000 mg | Freq: Four times a day (QID) | RECTAL | Status: DC | PRN

## 2015-06-13 MED ORDER — ASPIRIN 81 MG PO CHEW
81.0000 mg | CHEWABLE_TABLET | Freq: Every day | ORAL | Status: DC
  Administered 2015-06-14 – 2015-06-15 (×2): 81 mg via ORAL
  Filled 2015-06-13 (×2): qty 1

## 2015-06-13 MED ORDER — HYDROMORPHONE HCL 1 MG/ML IJ SOLN
0.5000 mg | INTRAMUSCULAR | Status: DC | PRN
  Administered 2015-06-14: 0.5 mg via INTRAVENOUS
  Filled 2015-06-13: qty 1

## 2015-06-13 MED ORDER — ACETAMINOPHEN 325 MG PO TABS
650.0000 mg | ORAL_TABLET | Freq: Four times a day (QID) | ORAL | Status: DC | PRN
  Administered 2015-06-13 – 2015-06-14 (×2): 650 mg via ORAL
  Filled 2015-06-13 (×2): qty 2

## 2015-06-13 MED ORDER — ALUM & MAG HYDROXIDE-SIMETH 200-200-20 MG/5ML PO SUSP
30.0000 mL | Freq: Four times a day (QID) | ORAL | Status: DC | PRN
  Administered 2015-06-14: 30 mL via ORAL
  Filled 2015-06-13: qty 30

## 2015-06-13 MED ORDER — ESCITALOPRAM OXALATE 10 MG PO TABS
5.0000 mg | ORAL_TABLET | Freq: Every day | ORAL | Status: DC
  Administered 2015-06-14 – 2015-06-15 (×2): 5 mg via ORAL
  Filled 2015-06-13 (×2): qty 1

## 2015-06-13 MED ORDER — SODIUM CHLORIDE 0.9 % IV BOLUS (SEPSIS)
1000.0000 mL | Freq: Once | INTRAVENOUS | Status: AC
  Administered 2015-06-13: 1000 mL via INTRAVENOUS

## 2015-06-13 MED ORDER — SODIUM CHLORIDE 0.9 % IV SOLN
INTRAVENOUS | Status: AC
  Administered 2015-06-13: 23:00:00 via INTRAVENOUS

## 2015-06-13 NOTE — ED Notes (Signed)
Awake. Verbally responsive. A/O x4. Resp even and unlabored. No audible adventitious breath sounds noted. ABC's intact. IV infusing NS at 999ml/hr without difficulty. 

## 2015-06-13 NOTE — ED Notes (Addendum)
Pt arrived via [facility] with report of AMS and foul odor urine and lt foot pain. Pt reported pain with voiding and lower back pain but denies urinary frequency/urgency, hematuria, and pressure with voiding. Pt is A/O x4.

## 2015-06-13 NOTE — ED Notes (Signed)
Bed: [id number] Expected date: 06/13/15 Expected time: 3:46 PM Means of arrival: Ambulance Comments: 81 yo ? UTI

## 2015-06-13 NOTE — ED Notes (Signed)
Ring cut off finger per  Family at bedside,  Renette ButtersNancy Lantry,  Leonette Mostharles NT and this writer witness

## 2015-06-13 NOTE — ED Notes (Signed)
Pt given water and family helped her hold cup because pt is unable to hold cup,  Family is concerned that pt has neuro problems,  I reviewed all test and no indication as I see but they request to see Dr Karma GanjaLinker and she is aware and will be in to speak with family shortly

## 2015-06-13 NOTE — ED Notes (Signed)
Patient transported to CT 

## 2015-06-13 NOTE — ED Notes (Signed)
Pt taken to CT scan/X-ray without distress noted.

## 2015-06-13 NOTE — ED Provider Notes (Signed)
CSN: [id number]     Arrival date & time 06/13/15  1546 History   First MD Initiated Contact with Patient 06/13/15 1617     Chief Complaint  Patient presents with  . Altered Mental Status     (Consider location/radiation/quality/duration/timing/severity/associated sxs/prior Treatment) HPI  A LEVEL 5 CAVEAT PERTAINS DUE TO ALTERED MENTAL STATUS.  Pt presenting with c/o confusion, repeating herself.  Has had foul smell to her urine.  Family also notes that she has had more dry mouth, drinking less water and has had some difficulty swallowing for the past 3 days.  She has bilateral lower extremity edema that is chronic and causes her pain in her legs.  She is not ambulatory.  No chest pain.  There are no other associated systemic symptoms, there are no other alleviating or modifying factors.   Past Medical History  Diagnosis Date  . Hypertension   . Bilateral leg edema   . Hyperlipidemia    Past Surgical History  Procedure Laterality Date  . Ankle fracture surgery    . Cholecystectomy     Family History  Problem Relation Age of Onset  . Family history unknown: Yes   Social History  Substance Use Topics  . Smoking status: Never Smoker   . Smokeless tobacco: None  . Alcohol Use: No   OB History    No data available     Review of Systems  ROS reviewed and all otherwise negative except for mentioned in HPI    Allergies  Amitriptyline; Buspar; Codeine; Dyazide; and Macrolides and ketolides  Home Medications   Prior to Admission medications   Medication Sig Start Date End Date Taking? Authorizing Provider  acetaminophen (TYLENOL) 325 MG tablet Take 650 mg by mouth every 8 (eight) hours. Not to exceed 3G   Yes Historical Provider, MD  amLODipine (NORVASC) 2.5 MG tablet Take 2.5 mg by mouth daily.   Yes Historical Provider, MD  antiseptic oral rinse (BIOTENE) LIQD 15 mLs by Mouth Rinse route 2 (two) times daily.   Yes Historical Provider, MD  aspirin 81 MG chewable tablet  Chew 81 mg by mouth daily.   Yes Historical Provider, MD  carboxymethylcellulose (REFRESH PLUS) 0.5 % SOLN Place 1 drop into both eyes 2 (two) times daily.    Yes Historical Provider, MD  carvedilol (COREG) 25 MG tablet Take 25 mg by mouth 2 (two) times daily with a meal. For HTN , NOTIFY PROVIDER IF BP IS OVER 160 FOR TWO CONSECUTIVE READINGS   Yes Historical Provider, MD  Cholecalciferol (VITAMIN D3) 50000 UNITS CAPS Take 1 capsule by mouth every 30 (thirty) days. TAKES ON THE 28TH OF EVERY MONTH   Yes Historical Provider, MD  escitalopram (LEXAPRO) 10 MG tablet Take 10 mg by mouth daily. TAKE WITH 10 MG TABLET TO EQUAL A TOTAL DOSE OF 15 MG DAILY   Yes Historical Provider, MD  escitalopram (LEXAPRO) 5 MG tablet Take 5 mg by mouth daily. TAKE WITH 10 MG TABLET TO EQUAL A TOTAL DOSE OF 15 MG DAILY   Yes Historical Provider, MD  furosemide (LASIX) 20 MG tablet Take 20 mg by mouth daily as needed for edema (of the legs).   Yes Historical Provider, MD  irbesartan (AVAPRO) 300 MG tablet Take 300 mg by mouth daily. For HTN   Yes Historical Provider, MD  levothyroxine (SYNTHROID, LEVOTHROID) 150 MCG tablet Take 150 mcg by mouth daily before breakfast.   Yes Historical Provider, MD  traMADol (ULTRAM) 50 MG tablet Take  one tablet by mouth every 12 hours as needed for moderate pain Patient taking differently: 50 mg every 12 (twelve) hours.  06/10/14  Yes Kimber Relic, MD  vitamin B-12 (CYANOCOBALAMIN) 1000 MCG tablet Take 1,000 mcg by mouth daily.   Yes Historical Provider, MD   BP 166/76 mmHg  Pulse 64  Temp(Src) 98 F (36.7 C) (Axillary)  Resp 18  Ht  (1.676 m)  Wt 218 lb 0.6 oz (98.9 kg)  BMI 35.21 kg/m2  SpO2 94%  Vitals reviewed Physical Exam  Physical Examination: General appearance - alert, chronically ill appearing, and in no distress Mental status - alert, oriented to person, place, not to time Eyes - pupils equal and reactive, extraocular eye movements intact Mouth - MM dry, OP  clear Chest - clear to auscultation, no wheezes, rales or rhonchi, symmetric air entry Heart - normal rate, regular rhythm, normal S1, S2, no murmurs, rubs, clicks or gallops Abdomen - soft, nontender, nondistended, no masses or organomegaly Neurological - alert, oriented x 2, normal speech, repetitive statements and questions, moving all extremities, 4/5 weakness throughout, sensation intact Extremities - peripheral pulses normal, no pedal edema, no clubbing or cyanosis Skin - normal coloration and turgor, no rashes  ED Course  Procedures (including critical care time) Labs Review Labs Reviewed  URINALYSIS, ROUTINE W REFLEX MICROSCOPIC (NOT AT [facility]) - Abnormal; Notable for the following:    APPearance CLOUDY (*)    Hgb urine dipstick TRACE (*)    Nitrite POSITIVE (*)    All other components within normal limits  CBC - Abnormal; Notable for the following:    RBC 3.85 (*)    MCV 103.4 (*)    All other components within normal limits  COMPREHENSIVE METABOLIC PANEL - Abnormal; Notable for the following:    Glucose, Bld 105 (*)    BUN 30 (*)    Creatinine, Ser 1.14 (*)    GFR calc non Af Amer 41 (*)    GFR calc Af Amer 48 (*)    All other components within normal limits  URINE MICROSCOPIC-ADD ON - Abnormal; Notable for the following:    Squamous Epithelial / LPF 0-5 (*)    Bacteria, UA MANY (*)    All other components within normal limits  BASIC METABOLIC PANEL - Abnormal; Notable for the following:    Glucose, Bld 124 (*)    BUN 24 (*)    GFR calc non Af Amer 56 (*)    All other components within normal limits  CBC - Abnormal; Notable for the following:    RBC 3.76 (*)    MCV 102.4 (*)    All other components within normal limits  MRSA PCR SCREENING  URINE CULTURE  PROTIME-INR  APTT  CBC  BASIC METABOLIC PANEL    Imaging Review Dg Chest 2 View  06/13/2015  CLINICAL DATA:  Shortness of breath.  Altered mental status. EXAM: CHEST  2 VIEW COMPARISON:  03/31/2014 chest  radiograph. FINDINGS: Stable cardiomediastinal silhouette with mild cardiomegaly. No pneumothorax. No pleural effusion. Stable mild pleural-parenchymal scarring at the right lung apex. No focal lung consolidation. No pulmonary edema. IMPRESSION: Stable mild cardiomegaly without pulmonary edema. No active pulmonary disease. Electronically Signed   By: Delbert Phenix M.D.   On: 06/13/2015 17:07   Ct Head Wo Contrast  06/13/2015  CLINICAL DATA:  Shortness of breath and altered mental status. EXAM: CT HEAD WITHOUT CONTRAST TECHNIQUE: Contiguous axial images were obtained from the base of the skull  through the vertex without intravenous contrast. COMPARISON:  03/31/2014 FINDINGS: Sinuses/Soft tissues: Minimal motion degradation. Clear paranasal sinuses and mastoid air cells. Intracranial: Expected cerebral volume loss for age. Mild low density in the periventricular white matter likely related to small vessel disease. Remote left basal ganglia lacunar infarct. Remote right cerebellar infarct. No mass lesion, hemorrhage, hydrocephalus, acute infarct, intra-axial, or extra-axial fluid collection. IMPRESSION: 1.  No acute intracranial abnormality. 2. 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 3. Minimal motion degradation. Electronically Signed   By: Jeronimo Greaves M.D.   On: 06/13/2015 17:09   I have personally reviewed and evaluated these images and lab results as part of my medical decision-making.   EKG Interpretation   Date/Time:  Saturday June 13 2015 17:02:35 EST Ventricular Rate:  60 PR Interval:  196 QRS Duration: 145 QT Interval:  485 QTC Calculation: 485 R Axis:   -52 Text Interpretation:  Sinus rhythm Left bundle branch block No significant  change since last tracing Confirmed by Karma Ganja  MD, Jaquitta Dupriest [phone]) on  06/13/2015 5:13:29 PM      MDM   Final diagnoses:  Altered mental status, unspecified altered mental status type  UTI (lower urinary tract infection)   Dehydration  Renal insufficiency  Generalized weakness  Dysphagia    Pt presenting with c/o generalized weakness, confusion.  On exam she appears significantly dehydrated with dry MM and poor skin turgor.  CT head without acute abnormalities.  Urine c/w UTI- pt started on rocephin and IV hydration.    9:04 PM on recheck pt continues to have altered mental status- she has tried to take a po challenge and is too weak to hold the cup to drink.  I have had long d/w family and patient and plan for admission to hospitalist service for IV antibitoics and continued hydration.  Urine culture pending, she has been started on rocephin.    9:35 PM d/w Dr. Lovell Sheehan, pt to be admitted to med/surg bed.  I have asked family about her code status they state she is full code.  This is what her paperwork from her facility reflects as well.    Jerelyn Scott, MD 06/14/15 [id number]

## 2015-06-13 NOTE — H&P (Addendum)
[facility] Admission History and Physical       Kristine [name] Hoog WUJ:[id number] DOB: [birth year] DOA: 06/13/2015  Referring physician: EDP PCP: No PCP Per Patient  Specialists:   Chief Complaint: Confusion and Weakness  HPI: Kristine [name] Clausen is a 81 y.[name]. female with a history of HTN, Stage III CKD, Hypothyroid, Hyperlipidemia who was sent tot he ED from [facility] due to increased confusion and weakness today.  She has had poor intake of foods and liquids for the past few days.  She has had dysuria, and low back pain, and her urine has had a foul odor.    Her family also reports that she has had intermittent difficulty swallowing.    She was evaluated in the ED and found to have a +UA, and an increase in her BUN/Cr.  SHe was administered IV Rocephin, and a Head CT was performed and was negative for acute findings.   She was referred for admission.       Review of Systems:  Constitutional: No Weight Loss, No Weight Gain, Night Sweats, Fevers, Chills, Dizziness, Light Headedness, Fatigue, or Generalized Weakness HEENT: No Headaches, Difficulty Swallowing,Tooth/Dental Problems,Sore Throat,  No Sneezing, Rhinitis, Ear Ache, Nasal Congestion, or Post Nasal Drip,  Cardio-vascular:  No Chest pain, Orthopnea, PND, +Edema in Lower Extremities, Anasarca, Dizziness, Palpitations  Resp: No Dyspnea, No DOE, No Productive Cough, No Non-Productive Cough, No Hemoptysis, No Wheezing.    GI: No Heartburn, Indigestion, Abdominal Pain, Nausea, Vomiting, Diarrhea, Constipation, Hematemesis, Hematochezia, Melena, Change in Bowel Habits,  +Loss of Appetite  GU: +Dysuria, No Change in Color of Urine, No Urgency or Urinary Frequency, No Flank pain.  Musculoskeletal: No Joint Pain or Swelling, No Decreased Range of Motion, No Back Pain.  Neurologic: No Syncope, No Seizures, Muscle Weakness, Paresthesia, Vision Disturbance or Loss, No Diplopia, No Vertigo, No Difficulty Walking,  Skin: No Rash or  Lesions. Psych: No Change in Mood or Affect, No Depression or Anxiety, No Memory loss, +Confusion, or Hallucinations   Past Medical History  Diagnosis Date  . Hypertension   . Bilateral leg edema   . Hyperlipidemia      Past Surgical History  Procedure Laterality Date  . Ankle fracture surgery    . Cholecystectomy        Prior to Admission medications   Medication Sig Start Date End Date Taking? Authorizing Provider  acetaminophen (TYLENOL) 325 MG tablet Take 650 mg by mouth every 8 (eight) hours. Not to exceed 3G   Yes Historical Provider, MD  amLODipine (NORVASC) 2.5 MG tablet Take 2.5 mg by mouth daily.   Yes Historical Provider, MD  antiseptic oral rinse (BIOTENE) LIQD 15 mLs by Mouth Rinse route 2 (two) times daily.   Yes Historical Provider, MD  aspirin 81 MG chewable tablet Chew 81 mg by mouth daily.   Yes Historical Provider, MD  carboxymethylcellulose (REFRESH PLUS) 0.5 % SOLN Place 1 drop into both eyes 2 (two) times daily.    Yes Historical Provider, MD  carvedilol (COREG) 25 MG tablet Take 25 mg by mouth 2 (two) times daily with a meal. For HTN , NOTIFY PROVIDER IF BP IS OVER 160 FOR TWO CONSECUTIVE READINGS   Yes Historical Provider, MD  Cholecalciferol (VITAMIN D3) 50000 UNITS CAPS Take 1 capsule by mouth every 30 (thirty) days. TAKES ON THE 28TH OF EVERY MONTH   Yes Historical Provider, MD  escitalopram (LEXAPRO) 10 MG tablet Take 10 mg by mouth daily. TAKE WITH 10 MG  TABLET TO EQUAL A TOTAL DOSE OF 15 MG DAILY   Yes Historical Provider, MD  escitalopram (LEXAPRO) 5 MG tablet Take 5 mg by mouth daily. TAKE WITH 10 MG TABLET TO EQUAL A TOTAL DOSE OF 15 MG DAILY   Yes Historical Provider, MD  furosemide (LASIX) 20 MG tablet Take 20 mg by mouth daily as needed for edema (of the legs).   Yes Historical Provider, MD  irbesartan (AVAPRO) 300 MG tablet Take 300 mg by mouth daily. For HTN   Yes Historical Provider, MD  levothyroxine (SYNTHROID, LEVOTHROID) 150 MCG tablet Take  150 mcg by mouth daily before breakfast.   Yes Historical Provider, MD  traMADol (ULTRAM) 50 MG tablet Take one tablet by mouth every 12 hours as needed for moderate pain Patient taking differently: 50 mg every 12 (twelve) hours.  06/10/14  Yes Kimber Relic, MD  vitamin B-12 (CYANOCOBALAMIN) 1000 MCG tablet Take 1,000 mcg by mouth daily.   Yes Historical Provider, MD     Allergies  Allergen Reactions  . Amitriptyline     Weak sluggish  . Buspar [Buspirone]     Increased anxiety   . Codeine     Lethargic gitter   . Dyazide [Hydrochlorothiazide W-Triamterene]     weakness  . Macrolides And Ketolides     Rash     Social History:  reports that she has never smoked. She does not have any smokeless tobacco history on file. She reports that she does not drink alcohol or use illicit drugs.    Family History  Problem Relation Age of Onset  . Family history unknown: Yes       Physical Exam:  GEN:  Pleasant Elderly Obese 81 y.[name]. female examined and in no acute distress; cooperative with exam Filed Vitals:   06/13/15 1811 06/13/15 1830 06/13/15 1930 06/13/15 2000  BP: 193/67 193/70 177/64 105/77  Pulse: 64 67 65 62  Temp:      TempSrc:      Resp: SpO2: 95% 94% 85% 91%   Blood pressure 105/77, pulse 62, temperature 98.6 F (37 C), temperature source Rectal, resp. rate 15, SpO2 91 %. PSYCH: She is alert and oriented x4; does not appear anxious does not appear depressed; affect is normal HEENT: Normocephalic and Atraumatic, Mucous membranes pink; PERRLA; EOM intact; Fundi:  Benign;  No scleral icterus, Nares: Patent, Oropharynx: Clear,Fair Dentition,    Neck:  FROM, No Cervical Lymphadenopathy nor Thyromegaly or Carotid Bruit; No JVD; Breasts:: Not examined CHEST WALL: No tenderness CHEST: Normal respiration, clear to auscultation bilaterally HEART: Regular rate and rhythm; no murmurs rubs or gallops BACK: No kyphosis or scoliosis; No CVA tenderness ABDOMEN:  Positive Bowel Sounds, Obese, Soft Non-Tender, No Rebound or Guarding; No Masses, No Organomegaly Rectal Exam: Not done EXTREMITIES: No Cyanosis, Clubbing, or Edema; No Ulcerations. Genitalia: not examined PULSES: 2+ and symmetric SKIN: Normal hydration no rash or ulceration CNS:  Alert and Oriented x 4, No Focal Deficits Vascular: pulses palpable throughout    Labs on Admission:  Basic Metabolic Panel:  Recent Labs Lab 06/13/15 1636  NA 140  K 4.7  CL 103  CO2 29  GLUCOSE 105*  BUN 30*  CREATININE 1.14*  CALCIUM 9.5   Liver Function Tests:  Recent Labs Lab 06/13/15 1636  AST 20  ALT 16  ALKPHOS 79  BILITOT 0.8  PROT 6.7  ALBUMIN 3.5   No results for input(s): LIPASE, AMYLASE in the last 168  hours. No results for input(s): AMMONIA in the last 168 hours. CBC:  Recent Labs Lab 06/13/15 1636  WBC 6.0  HGB 13.1  HCT 39.8  MCV 103.4*  PLT 252   Cardiac Enzymes: No results for input(s): CKTOTAL, CKMB, CKMBINDEX, TROPONINI in the last 168 hours.  BNP (last 3 results) No results for input(s): BNP in the last 8760 hours.  ProBNP (last 3 results) No results for input(s): PROBNP in the last 8760 hours.  CBG: No results for input(s): GLUCAP in the last 168 hours.  Radiological Exams on Admission: Dg Chest 2 View  06/13/2015  CLINICAL DATA:  Shortness of breath.  Altered mental status. EXAM: CHEST  2 VIEW COMPARISON:  03/31/2014 chest radiograph. FINDINGS: Stable cardiomediastinal silhouette with mild cardiomegaly. No pneumothorax. No pleural effusion. Stable mild pleural-parenchymal scarring at the right lung apex. No focal lung consolidation. No pulmonary edema. IMPRESSION: Stable mild cardiomegaly without pulmonary edema. No active pulmonary disease. Electronically Signed   By: Delbert Phenix M.D.   On: 06/13/2015 17:07   Ct Head Wo Contrast  06/13/2015  CLINICAL DATA:  Shortness of breath and altered mental status. EXAM: CT HEAD WITHOUT CONTRAST TECHNIQUE:  Contiguous axial images were obtained from the base of the skull through the vertex without intravenous contrast. COMPARISON:  03/31/2014 FINDINGS: Sinuses/Soft tissues: Minimal motion degradation. Clear paranasal sinuses and mastoid air cells. Intracranial: Expected cerebral volume loss for age. Mild low density in the periventricular white matter likely related to small vessel disease. Remote left basal ganglia lacunar infarct. Remote right cerebellar infarct. No mass lesion, hemorrhage, hydrocephalus, acute infarct, intra-axial, or extra-axial fluid collection. IMPRESSION: 1.  No acute intracranial abnormality. 2. 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 3. Minimal motion degradation. Electronically Signed   By: Jeronimo Greaves M.D.   On: 06/13/2015 17:09     EKG: Independently reviewed. Normal Sinus Rhythm rate  = +LBBB   Assessment/Plan:   81 y.[name]. female with  Principal Problem:   1.     Acute encephalopathy- due to UTI and AKI, Ct head Negative for Acute findings   Monitor    Active Problems:   2.     UTI (lower urinary tract infection)   Urine C+S sent   IV Rocephin     3.     AKI (acute kidney injury) (HCC)   Hold Avapro, and Lasix Rx   Gentle IVFs overnight   Monitor BUN/Cr     4.     Hypothyroidism   Continue Levothyroxine Rx      5.     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     KIDNEY DISEASE, CHRONIC, STAGE III   Monitor BUN/Cr      7.     Dysphagia   Speech Evaluation for Diet     8.     DVT Prophylaxis   Lovenox     Code Status:     FULL CODE        Family Communication:   Family at Bedside   Disposition Plan:    Inpatient Status       Time spent:  53 Minutes      Ron Parker [facility] Pager [phone]   If 7AM -7PM Please Contact the Day Rounding Team MD for  [facility]  If 7PM-7AM, Please Contact Night-Floor Coverage  [email or web address] Password [email or web address] 06/13/2015, 9:47 PM     ADDENDUM:   Patient was seen and examined on 06/13/2015

## 2015-06-13 NOTE — ED Notes (Signed)
Pt is very confused per her family at bedside,  Pt request food but per her family she chokes and throws up every time she eats the past few weeks,  Pt has ice  Chips that were given to her prior to my shift beginning

## 2015-06-13 NOTE — ED Notes (Signed)
Awake. Verbally responsive. A/O x4. Resp even and unlabored. No audible adventitious breath sounds noted. ABC's intact. IV saline lock patent and intact. 

## 2015-06-14 DIAGNOSIS — I10 Essential (primary) hypertension: Secondary | ICD-10-CM | POA: Diagnosis present

## 2015-06-14 DIAGNOSIS — D519 Vitamin B12 deficiency anemia, unspecified: Secondary | ICD-10-CM

## 2015-06-14 DIAGNOSIS — E038 Other specified hypothyroidism: Secondary | ICD-10-CM

## 2015-06-14 LAB — CBC
HCT: 38.5 % (ref 36.0–46.0)
HEMOGLOBIN: 12.8 g/dL (ref 12.0–15.0)
MCH: 34 pg (ref 26.0–34.0)
MCHC: 33.2 g/dL (ref 30.0–36.0)
MCV: 102.4 fL — AB (ref 78.0–100.0)
Platelets: 248 10*3/uL (ref 150–400)
RBC: 3.76 MIL/uL — AB (ref 3.87–5.11)
RDW: 13.2 % (ref 11.5–15.5)
WBC: 6.8 10*3/uL (ref 4.0–10.5)

## 2015-06-14 LAB — BASIC METABOLIC PANEL
ANION GAP: 8 (ref 5–15)
BUN: 24 mg/dL — ABNORMAL HIGH (ref 6–20)
CALCIUM: 9.2 mg/dL (ref 8.9–10.3)
CHLORIDE: 105 mmol/L (ref 101–111)
CO2: 25 mmol/L (ref 22–32)
Creatinine, Ser: 0.89 mg/dL (ref 0.44–1.00)
GFR calc non Af Amer: 56 mL/min — ABNORMAL LOW (ref >60)
Glucose, Bld: 124 mg/dL — ABNORMAL HIGH (ref 65–99)
Potassium: 4.1 mmol/L (ref 3.5–5.1)
Sodium: 138 mmol/L (ref 135–145)

## 2015-06-14 LAB — MRSA PCR SCREENING: MRSA BY PCR: NEGATIVE

## 2015-06-14 MED ORDER — SODIUM CHLORIDE 0.9 % IV SOLN
INTRAVENOUS | Status: DC
  Administered 2015-06-14: 14:00:00 via INTRAVENOUS

## 2015-06-14 MED ORDER — HYDRALAZINE HCL 20 MG/ML IJ SOLN
10.0000 mg | Freq: Four times a day (QID) | INTRAMUSCULAR | Status: DC | PRN
  Administered 2015-06-14 (×2): 10 mg via INTRAVENOUS
  Filled 2015-06-14 (×3): qty 1

## 2015-06-14 MED ORDER — DEXTROSE 5 % IV SOLN
1.0000 g | INTRAVENOUS | Status: DC
  Administered 2015-06-14: 1 g via INTRAVENOUS
  Filled 2015-06-14: qty 10

## 2015-06-14 NOTE — Clinical Social Work Note (Signed)
Clinical Social Work Assessment  Patient Details  Name: Kristine [name] MRN: [id number] Date of Birth: 22-Jun-1924  Date of referral:  06/14/15               Reason for consult:  Facility Placement                Permission sought to share information with:  Family Supports Permission granted to share information::     Name::     son- Tax adviserBill  Agency::     Relationship::     Contact Information:     Housing/Transportation Living arrangements for the past 2 months:  Skilled Building surveyorursing Facility Source of Information:  Adult Children Patient Interpreter Needed:  None Criminal Activity/Legal Involvement Pertinent to Current Situation/Hospitalization:  No - Comment as needed Significant Relationships:  Adult Children, Merchandiser, retailCommunity Support Lives with:  Facility Resident Do you feel safe going back to the place where you live?  Yes Need for family participation in patient care:  Yes (Comment)  Care giving concerns:  Patient admitted from [facility]   Social Worker assessment / plan:  CSW has updated FL2 and communicated with son and SNF for plans for return tomorrow.  Employment status:  Retired Database administratornsurance information:  Managed Medicare PT Recommendations:  Skilled Nursing Facility Information / Referral to community resources:  Skilled Nursing Facility  Patient/Family's Response to care:  Son reports his [facility] just over a year.   Patient/Family's Understanding of and Emotional Response to Diagnosis, Current Treatment, and Prognosis:  Son understands patient's condition and plans- he is optimistic and pleased she may be able to return tomorrow-   Emotional Assessment Appearance:  Developmentally appropriate Attitude/Demeanor/Rapport:    Affect (typically observed):  Appropriate Orientation:  Oriented to Self Alcohol / Substance use:  Not Applicable Psych involvement (Current and /or in the community):  No (Comment)  Discharge Needs  Concerns to be  addressed:  Discharge Planning Concerns Readmission within the last 30 days:  No Current discharge risk:  None Barriers to Discharge:      Kristine [name], Kristine Meuth Parks, LCSW 06/14/2015, 2:48 PM

## 2015-06-14 NOTE — Progress Notes (Signed)
Utilization Review Completed.Kristine [name] T11/20/2016  

## 2015-06-14 NOTE — Progress Notes (Signed)
Patient ID: Kristine [name], female   DOB: [birth year], 81 y.o.   MRN: [id number]  [facility] PROGRESS NOTE  Kristine [name] ZHY:[id number] DOB: 1924-06-16 DOA: 06/13/2015 PCP: No PCP Per Patient   Brief narrative:    81 y.o. female with HTN, CKD stage III, HLD, resident of [facility], presented to [facility] ED for evaluation of confusion and noticeable weakness with poor oral intake, per nursing staff at the center. In ED, pt was hemodynamically stable but oxygen saturation was 85% on RA for unclear reason. UA was suggestive of UTI and pt was started on Rocephin. TR[facility] asked to admit for further evaluation.   Assessment/Plan:    Principal Problem:   Acute encephalopathy - multifactorial and secondary to failure to thrive, poor oral intake, UTI - pt still clinically dry on exam - continue to treat UTI, provide IVF and monitor clinical response - will need SLP and PT evaluation   Active Problems:   UTI (lower urinary tract infection) - keep on Rocephin day #2 - follow up on urine cultures     AKI (acute kidney injury) (HCC) imposed on CKD, stage III - likely pre renal in etiology  - continue to provide IVF as pt is responding well - Cr is now at baseline and WNL     Essential HTN - continue Norvasc and Coreg     Morbid obesity (HCC) - pt meets criteria with BMI 35 and underlying risks such as HTN and HLD - Body mass index is 35.21 kg/(m^2).    Dysphagia - SLP requested, follow up on recommendations     Hypothyroidism - continue Synthroid per home medical regimen       Anemia, B12 deficiency - Hg stable, no sings of bleeding - CBC in AM  DVT prophylaxis - Lovenox SQ  Code Status: Full.  Family Communication:  plan of care discussed with the patient, no family at bedside  Disposition Plan: [facility] in am, 06/15/2015   IV access:  Peripheral IV  Procedures and diagnostic studies:    Dg Chest 2 View 06/13/2015  Stable mild cardiomegaly without pulmonary  edema. No active pulmonary disease.  Ct Head Wo Contrast 06/13/2015 No acute intracranial abnormality. 2. 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 3. Minimal motion degradation.   Medical Consultants:  None  Other Consultants:  SLP PT   IAnti-Infectives:   Rocephin 11/19 -->  Debbora Presto, MD  [facility] Pager [phone]  If 7PM-7AM, please contact night-coverage [email or web address] Password [facility] 06/14/2015, 8:43 AM   LOS: 1 day   HPI/Subjective: No events overnight.   Objective: Filed Vitals:   06/14/15 0300 06/14/15 0520 06/14/15 0548 06/14/15 0649  BP: 169/70 186/61 188/72 153/53  Pulse:  64 68 66  Temp:  98.6 F (37 C) 98 F (36.7 C)   TempSrc:  Axillary Axillary   Resp:  18 18   Height:      Weight:      SpO2:  94% 94%    No intake or output data in the 24 hours ending 06/14/15 0843  Exam:   General:  Pt is alert, NAD, confused intermittently   Cardiovascular: Regular rate and rhythm, no rubs, no gallops  Respiratory: Clear to auscultation bilaterally, no wheezing, diminished breath sounds at bases   Abdomen: Soft, non tender, non distended, bowel sounds present, no guarding  Extremities: pulses DP and PT palpable bilaterally, bilateral LE chronic venous stasis changes   Data Reviewed: Basic Metabolic Panel:  Recent Labs Lab 06/13/15 1636 06/14/15 0631  NA 140 138  K 4.7 4.1  CL 103 105  CO2 29 25  GLUCOSE 105* 124*  BUN 30* 24*  CREATININE 1.14* 0.89  CALCIUM 9.5 9.2   Liver Function Tests:  Recent Labs Lab 06/13/15 1636  AST 20  ALT 16  ALKPHOS 79  BILITOT 0.8  PROT 6.7  ALBUMIN 3.5   CBC:  Recent Labs Lab 06/13/15 1636 06/14/15 0631  WBC 6.0 6.8  HGB 13.1 12.8  HCT 39.8 38.5  MCV 103.4* 102.4*  PLT 252 248   Recent Results (from the past 240 hour(s))  MRSA PCR Screening     Status: None   Collection Time: 06/13/15 10:47 PM  Result Value Ref Range Status   MRSA by PCR NEGATIVE NEGATIVE  Final     Scheduled Meds: . amLODipine  2.5 mg Oral Daily  . aspirin  81 mg Oral Daily  . carvedilol  25 mg Oral BID WC  . cefTRIAXone (ROCEPHIN)  IV  1 g Intravenous Q24H  . enoxaparin (LOVENOX) injection  50 mg Subcutaneous QHS  . escitalopram  5 mg Oral Daily  . levothyroxine  150 mcg Oral QAC breakfast   Continuous Infusions: . sodium chloride 75 mL/hr at 06/13/15 2245

## 2015-06-14 NOTE — NC FL2 (Signed)
[place] MEDICAID FL2 LEVEL OF CARE SCREENING TOOL     IDENTIFICATION  Patient Name: Kristine [name] Birthdate: May 23, 1924 Sex: female Admission Date (Current Location): 06/13/2015  [facility] and [place] Number: Producer, television/film/video and Address:  [facility],  [address], [place] [address]      Provider Number: [id number]  Attending Physician Name and Address:  Dorothea Ogle, MD  Relative Name and Phone Number:       Current Level of Care: Hospital Recommended Level of Care: Skilled Nursing Facility Prior Approval Number:    Date Approved/Denied:   PASRR Number: [id number]  Discharge Plan: SNF    Current Diagnoses: Patient Active Problem List   Diagnosis Date Noted  . Morbid obesity (HCC) 06/14/2015  . Benign essential HTN 06/14/2015  . Acute encephalopathy 06/13/2015  . AKI (acute kidney injury) (HCC) 06/13/2015  . Dysphagia 06/13/2015  . UTI (lower urinary tract infection) 03/15/2014  . Hypothyroidism 02/23/2007  . Anemia, B12 deficiency 02/23/2007  . KIDNEY DISEASE, CHRONIC, STAGE III 02/23/2007    Orientation ACTIVITIES/SOCIAL BLADDER RESPIRATION    Self, Time, Situation, Place  Active Continent Normal  BEHAVIORAL SYMPTOMS/MOOD NEUROLOGICAL BOWEL NUTRITION STATUS      Continent    PHYSICIAN VISITS COMMUNICATION OF NEEDS Height & Weight Skin    Verbally  (167.6 cm) 218 lbs. Normal          AMBULATORY STATUS RESPIRATION    Assist extensive Normal      Personal Care Assistance Level of Assistance  Bathing, Dressing Bathing Assistance: Limited assistance   Dressing Assistance: Limited assistance      Functional Limitations Info                SPECIAL CARE FACTORS FREQUENCY  PT (By licensed PT), OT (By licensed OT)                   Additional Factors Info                  Current Medications (06/14/2015): Current Facility-Administered Medications  Medication Dose Route Frequency Provider Last Rate  Last Dose  . 0.9 %  sodium chloride infusion   Intravenous Continuous Dorothea Ogle, MD 75 mL/hr at 06/14/15 1409    . acetaminophen (TYLENOL) tablet 650 mg  650 mg Oral Q6H PRN Ron Parker, MD   650 mg at 06/13/15 2317   Or  . acetaminophen (TYLENOL) suppository 650 mg  650 mg Rectal Q6H PRN Ron Parker, MD      . alum & mag hydroxide-simeth (MAALOX/MYLANTA) 200-200-20 MG/5ML suspension 30 mL  30 mL Oral Q6H PRN Ron Parker, MD   30 mL at 06/14/15 0823  . amLODipine (NORVASC) tablet 2.5 mg  2.5 mg Oral Daily Ron Parker, MD   2.5 mg at 06/14/15 1052  . aspirin chewable tablet 81 mg  81 mg Oral Daily Ron Parker, MD   81 mg at 06/14/15 1052  . carvedilol (COREG) tablet 25 mg  25 mg Oral BID WC Ron Parker, MD   25 mg at 06/14/15 0901  . cefTRIAXone (ROCEPHIN) 1 g in dextrose 5 % 50 mL IVPB  1 g Intravenous Q24H Harvette C Jenkins, MD      . enoxaparin (LOVENOX) injection 50 mg  50 mg Subcutaneous QHS Ron Parker, MD   50 mg at 06/13/15 2300  . escitalopram (LEXAPRO) tablet 5 mg  5 mg Oral Daily Harvette C  Lovell SheehanJenkins, MD   5 mg at 06/14/15 1052  . hydrALAZINE (APRESOLINE) injection 10 mg  10 mg Intravenous Q6H PRN Rolan Lipahomas Michael Callahan, NP   10 mg at 06/14/15 0553  . HYDROmorphone (DILAUDID) injection 0.5-1 mg  0.5-1 mg Intravenous Q3H PRN Ron ParkerHarvette C Jenkins, MD   0.5 mg at 06/14/15 1410  . levothyroxine (SYNTHROID, LEVOTHROID) tablet 150 mcg  150 mcg Oral QAC breakfast Ron ParkerHarvette C Jenkins, MD   150 mcg at 06/14/15 [id number]  . ondansetron (ZOFRAN) tablet 4 mg  4 mg Oral Q6H PRN Ron ParkerHarvette C Jenkins, MD       Or  . ondansetron (ZOFRAN) injection 4 mg  4 mg Intravenous Q6H PRN Ron ParkerHarvette C Jenkins, MD      . oxyCODONE (Oxy IR/ROXICODONE) immediate release tablet 5 mg  5 mg Oral Q4H PRN Ron ParkerHarvette C Jenkins, MD   5 mg at 06/14/15 0801   Do not use this list as official medication orders. Please verify with discharge summary.  Discharge Medications:    Medication List    ASK your doctor about these medications        acetaminophen 325 MG tablet  Commonly known as:  TYLENOL  Take 650 mg by mouth every 8 (eight) hours. Not to exceed 3G     amLODipine 2.5 MG tablet  Commonly known as:  NORVASC  Take 2.5 mg by mouth daily.     antiseptic oral rinse Liqd  15 mLs by Mouth Rinse route 2 (two) times daily.     aspirin 81 MG chewable tablet  Chew 81 mg by mouth daily.     carboxymethylcellulose 0.5 % Soln  Commonly known as:  REFRESH PLUS  Place 1 drop into both eyes 2 (two) times daily.     carvedilol 25 MG tablet  Commonly known as:  COREG  Take 25 mg by mouth 2 (two) times daily with a meal. For HTN , NOTIFY PROVIDER IF BP IS OVER 160 FOR TWO CONSECUTIVE READINGS     escitalopram 10 MG tablet  Commonly known as:  LEXAPRO  Take 10 mg by mouth daily. TAKE WITH 10 MG TABLET TO EQUAL A TOTAL DOSE OF 15 MG DAILY     escitalopram 5 MG tablet  Commonly known as:  LEXAPRO  Take 5 mg by mouth daily. TAKE WITH 10 MG TABLET TO EQUAL A TOTAL DOSE OF 15 MG DAILY     furosemide 20 MG tablet  Commonly known as:  LASIX  Take 20 mg by mouth daily as needed for edema (of the legs).     irbesartan 300 MG tablet  Commonly known as:  AVAPRO  Take 300 mg by mouth daily. For HTN     levothyroxine 150 MCG tablet  Commonly known as:  SYNTHROID, LEVOTHROID  Take 150 mcg by mouth daily before breakfast.     traMADol 50 MG tablet  Commonly known as:  ULTRAM  Take one tablet by mouth every 12 hours as needed for moderate pain     vitamin B-12 1000 MCG tablet  Commonly known as:  CYANOCOBALAMIN  Take 1,000 mcg by mouth daily.     Vitamin D3 50000 UNITS Caps  Take 1 capsule by mouth every 30 (thirty) days. TAKES ON THE 28TH OF EVERY MONTH        Relevant Imaging Results:  Relevant Lab Results:  Recent Labs    Additional Information SSN [id number]  Liliana ClineCaldwell, Daylyn Azbill Parks, LCSW

## 2015-06-15 DIAGNOSIS — G9340 Encephalopathy, unspecified: Secondary | ICD-10-CM

## 2015-06-15 LAB — BASIC METABOLIC PANEL
Anion gap: 9 (ref 5–15)
BUN: 21 mg/dL — ABNORMAL HIGH (ref 6–20)
CALCIUM: 8.8 mg/dL — AB (ref 8.9–10.3)
CHLORIDE: 105 mmol/L (ref 101–111)
CO2: 24 mmol/L (ref 22–32)
CREATININE: 0.88 mg/dL (ref 0.44–1.00)
GFR, EST NON AFRICAN AMERICAN: 57 mL/min — AB (ref >60)
Glucose, Bld: 111 mg/dL — ABNORMAL HIGH (ref 65–99)
Potassium: 4.1 mmol/L (ref 3.5–5.1)
SODIUM: 138 mmol/L (ref 135–145)

## 2015-06-15 LAB — CBC
HCT: 37.1 % (ref 36.0–46.0)
HEMOGLOBIN: 12.4 g/dL (ref 12.0–15.0)
MCH: 34.4 pg — ABNORMAL HIGH (ref 26.0–34.0)
MCHC: 33.4 g/dL (ref 30.0–36.0)
MCV: 103.1 fL — ABNORMAL HIGH (ref 78.0–100.0)
PLATELETS: 244 10*3/uL (ref 150–400)
RBC: 3.6 MIL/uL — ABNORMAL LOW (ref 3.87–5.11)
RDW: 13.2 % (ref 11.5–15.5)
WBC: 6.6 10*3/uL (ref 4.0–10.5)

## 2015-06-15 MED ORDER — HYDRALAZINE HCL 10 MG PO TABS
10.0000 mg | ORAL_TABLET | Freq: Once | ORAL | Status: AC
  Administered 2015-06-15: 10 mg via ORAL
  Filled 2015-06-15: qty 1

## 2015-06-15 MED ORDER — ALUM & MAG HYDROXIDE-SIMETH 200-200-20 MG/5ML PO SUSP: 30.0000 mL | Freq: Four times a day (QID) | ORAL | Status: DC | PRN

## 2015-06-15 MED ORDER — CIPROFLOXACIN HCL 500 MG PO TABS: 500.0000 mg | ORAL_TABLET | Freq: Two times a day (BID) | ORAL | Status: DC

## 2015-06-15 NOTE — Progress Notes (Signed)
Chart was reviewed to help with cross coverage for Dr Elisabeth Pigeonevine, Dr Elisabeth Pigeonevine is now available

## 2015-06-15 NOTE — Discharge Summary (Signed)
Physician Discharge Summary  Kristine [name] [id number] DOB: June 08, 1924 DOA: 06/13/2015  PCP: No PCP Per Patient pt is going to SNF  Admit date: 06/13/2015 Discharge date: 06/15/2015  Recommendations for Outpatient Follow-up:  Continue cipro for 5 days on discharge for UTI  Discharge Diagnoses:  Principal Problem:   Acute encephalopathy Active Problems:   Hypothyroidism   Anemia, B12 deficiency   KIDNEY DISEASE, CHRONIC, STAGE III   UTI (lower urinary tract infection)   AKI (acute kidney injury) (HCC)   Dysphagia   Morbid obesity (HCC)   Benign essential HTN    Discharge Condition: stable   Diet recommendation: as tolerated   History of present illness:  81 y.o. female with past medical history of HTN, CKD stage III, HLD, resident of [facility] who [facility] for evaluation of confusion and noticeable weakness with poor oral intake, per nursing staff at the center.  In ED, pt was hemodynamically stable but oxygen saturation was 85% on RA for unclear reason. UA was suggestive of UTI and pt was started on empiric Rocephin.   Hospital Course:  Assessment/Plan:    Principal Problem:   Acute encephalopathy - Likely secondary to combination of failure to thrive, poor oral intake, UTI - We are treating with rocephin in hospital and she will continue cipro on discharge for UTI - SLP and PT evaluation to be done prior to discharge   Active Problems:  UTI (lower urinary tract infection) - Has taken rocephin in hospital - Will continue cipro on discharge for 5 days    AKI (acute kidney injury) (HCC) imposed on CKD, stage III - She is on as needed lasix which could have contributed to slightly elevated CR, 1.14 - Cr now WNL - May resume lasix as needed for edema on discharge    Essential HTN - Continue Norvasc, Avapro and Coreg. Her SBP in 170's prior to being given BP meds   Morbid obesity (HCC) - Body mass index is 35.21 kg/(m^2). - Seen by  nutritionist    Dysphagia - SLP requested, to be done prior to discharge    Hypothyroidism - Continue Synthroid     Anemia, B12 deficiency - Hg stable, no sings of bleeding  DVT prophylaxis  - Lovenox SQ ordered   Code Status: Full.  Family Communication: No family at bedside    IV access:  Peripheral IV  Procedures and diagnostic studies:   Dg Chest 2 View 06/13/2015 Stable mild cardiomegaly without pulmonary edema. No active pulmonary disease.  Ct Head Wo Contrast 06/13/2015 No acute intracranial abnormality. 2. 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 3. Minimal motion degradation.   Medical Consultants:  None  Other Consultants:  SLP PT   IAnti-Infectives:   Rocephin 11/19 --> 06/15/2015 Cipro for 5 days on discharge      Signed:  Manson PasseyEVINE, Padraig Nhan, MD  [facility] 06/15/2015, 9:36 AM  Pager #: [phone]  Time spent in minutes: more than 30 minutes   Discharge Exam: Filed Vitals:   06/14/15 2103 06/15/15 0500  BP: 152/50 170/65  Pulse: 63 70  Temp: 98.7 F (37.1 C) 98.4 F (36.9 C)  Resp: 16 20   Filed Vitals:   06/14/15 0649 06/14/15 1500 06/14/15 2103 06/15/15 0500  BP: 153/53 166/76 152/50 170/65  Pulse: 66 64 63 70  Temp:   98.7 F (37.1 C) 98.4 F (36.9 C)  TempSrc:   Axillary Oral  Resp:  18 16 20   Height:  Weight:      SpO2:   96% 96%    General: Pt is alert, not in acute distress Cardiovascular: Regular rate and rhythm, S1/S2 + Respiratory: Clear to auscultation bilaterally, no wheezing, no crackles, no rhonchi Abdominal: Soft, non tender, non distended, bowel sounds +, no guarding Extremities: no edema, no cyanosis, pulses palpable bilaterally DP and PT Neuro: Grossly nonfocal  Discharge Instructions  Discharge Instructions    Call MD for:  difficulty breathing, headache or visual disturbances    Complete by:  As directed      Call MD for:  persistant  dizziness or light-headedness    Complete by:  As directed      Call MD for:  persistant nausea and vomiting    Complete by:  As directed      Call MD for:  severe uncontrolled pain    Complete by:  As directed      Diet - low sodium heart healthy    Complete by:  As directed      Discharge instructions    Complete by:  As directed   Continue cipro for 5 days on discharge for UTI     Increase activity slowly    Complete by:  As directed             Medication List    STOP taking these medications        traMADol 50 MG tablet  Commonly known as:  ULTRAM      TAKE these medications        acetaminophen 325 MG tablet  Commonly known as:  TYLENOL  Take 650 mg by mouth every 8 (eight) hours. Not to exceed 3G     alum & mag hydroxide-simeth 200-200-20 MG/5ML suspension  Commonly known as:  MAALOX/MYLANTA  Take 30 mLs by mouth every 6 (six) hours as needed for indigestion or heartburn (dyspepsia).     amLODipine 2.5 MG tablet  Commonly known as:  NORVASC  Take 2.5 mg by mouth daily.     antiseptic oral rinse Liqd  15 mLs by Mouth Rinse route 2 (two) times daily.     aspirin 81 MG chewable tablet  Chew 81 mg by mouth daily.     carboxymethylcellulose 0.5 % Soln  Commonly known as:  REFRESH PLUS  Place 1 drop into both eyes 2 (two) times daily.     carvedilol 25 MG tablet  Commonly known as:  COREG  Take 25 mg by mouth 2 (two) times daily with a meal. For HTN , NOTIFY PROVIDER IF BP IS OVER 160 FOR TWO CONSECUTIVE READINGS     ciprofloxacin 500 MG tablet  Commonly known as:  CIPRO  Take 1 tablet (500 mg total) by mouth 2 (two) times daily.     escitalopram 10 MG tablet  Commonly known as:  LEXAPRO  Take 10 mg by mouth daily. TAKE WITH 10 MG TABLET TO EQUAL A TOTAL DOSE OF 15 MG DAILY     escitalopram 5 MG tablet  Commonly known as:  LEXAPRO  Take 5 mg by mouth daily. TAKE WITH 10 MG TABLET TO EQUAL A TOTAL DOSE OF 15 MG DAILY     furosemide 20 MG tablet   Commonly known as:  LASIX  Take 20 mg by mouth daily as needed for edema (of the legs).     irbesartan 300 MG tablet  Commonly known as:  AVAPRO  Take 300 mg by mouth daily. For HTN  levothyroxine 150 MCG tablet  Commonly known as:  SYNTHROID, LEVOTHROID  Take 150 mcg by mouth daily before breakfast.     vitamin B-12 1000 MCG tablet  Commonly known as:  CYANOCOBALAMIN  Take 1,000 mcg by mouth daily.     Vitamin D3 50000 UNITS Caps  Take 1 capsule by mouth every 30 (thirty) days. TAKES ON THE 28TH OF EVERY MONTH          The results of significant diagnostics from this hospitalization (including imaging, microbiology, ancillary and laboratory) are listed below for reference.    Significant Diagnostic Studies: Dg Chest 2 View  06/13/2015  CLINICAL DATA:  Shortness of breath.  Altered mental status. EXAM: CHEST  2 VIEW COMPARISON:  03/31/2014 chest radiograph. FINDINGS: Stable cardiomediastinal silhouette with mild cardiomegaly. No pneumothorax. No pleural effusion. Stable mild pleural-parenchymal scarring at the right lung apex. No focal lung consolidation. No pulmonary edema. IMPRESSION: Stable mild cardiomegaly without pulmonary edema. No active pulmonary disease. Electronically Signed   By: Delbert Phenix M.D.   On: 06/13/2015 17:07   Ct Head Wo Contrast  06/13/2015  CLINICAL DATA:  Shortness of breath and altered mental status. EXAM: CT HEAD WITHOUT CONTRAST TECHNIQUE: Contiguous axial images were obtained from the base of the skull through the vertex without intravenous contrast. COMPARISON:  03/31/2014 FINDINGS: Sinuses/Soft tissues: Minimal motion degradation. Clear paranasal sinuses and mastoid air cells. Intracranial: Expected cerebral volume loss for age. Mild low density in the periventricular white matter likely related to small vessel disease. Remote left basal ganglia lacunar infarct. Remote right cerebellar infarct. No mass lesion, hemorrhage, hydrocephalus, acute  infarct, intra-axial, or extra-axial fluid collection. IMPRESSION: 1.  No acute intracranial abnormality. 2. 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 3. Minimal motion degradation. Electronically Signed   By: Jeronimo Greaves M.D.   On: 06/13/2015 17:09    Microbiology: Recent Results (from the past 240 hour(s))  MRSA PCR Screening     Status: None   Collection Time: 06/13/15 10:47 PM  Result Value Ref Range Status   MRSA by PCR NEGATIVE NEGATIVE Final     Labs: Basic Metabolic Panel:  Recent Labs Lab 06/13/15 1636 06/14/15 0631 06/15/15 0418  NA 140 138 138  K 4.7 4.1 4.1  CL 103 105 105  CO2 GLUCOSE 105* 124* 111*  BUN 30* 24* 21*  CREATININE 1.14* 0.89 0.88  CALCIUM 9.5 9.2 8.8*   Liver Function Tests:  Recent Labs Lab 06/13/15 1636  AST 20  ALT 16  ALKPHOS 79  BILITOT 0.8  PROT 6.7  ALBUMIN 3.5   No results for input(s): LIPASE, AMYLASE in the last 168 hours. No results for input(s): AMMONIA in the last 168 hours. CBC:  Recent Labs Lab 06/13/15 1636 06/14/15 0631 06/15/15 0418  WBC 6.0 6.8 6.6  HGB 13.1 12.8 12.4  HCT 39.8 38.5 37.1  MCV 103.4* 102.4* 103.1*  PLT 252 248 244   Cardiac Enzymes: No results for input(s): CKTOTAL, CKMB, CKMBINDEX, TROPONINI in the last 168 hours. BNP: BNP (last 3 results) No results for input(s): BNP in the last 8760 hours.  ProBNP (last 3 results) No results for input(s): PROBNP in the last 8760 hours.  CBG: No results for input(s): GLUCAP in the last 168 hours.

## 2015-06-15 NOTE — Progress Notes (Signed)
PT Cancellation Note / Screen  Patient Details Name: Kristine SalvageWilma O Enright MRN: [id number] DOB: [birth year]   Cancelled Treatment:    Reason Eval/Treat Not Completed: PT screened, no needs identified, will sign off Per CSW, pt to discharge back to SNF shortly and does not require PT evaluation.  Will defer PT needs to SNF.   Johm Pfannenstiel,KATHrine E 06/15/2015, 11:22 AM Zenovia JarredKati Kiyoto Slomski, PT, DPT 06/15/2015 Pager: [phone]

## 2015-06-15 NOTE — Progress Notes (Signed)
Nutrition Brief Note  RD consulted for nutritional assessment. Patient expected to discharge today to SNF. Pt eating 100% of heart healthy diet. Pt with weight gain since last year.   Wt Readings from Last 15 Encounters:  06/13/15 218 lb 0.6 oz (98.9 kg)  03/15/14 205 lb 14.6 oz (93.4 kg)    Body mass index is 35.21 kg/(m^2). Patient meets criteria for obesity based on current BMI.   Current diet order is heart healthy, patient is consuming approximately 100% of meals at this time. Labs and medications reviewed.   No nutrition interventions warranted at this time. If nutrition issues arise, please consult RD.   Kristine FrancoLindsey Reniya Mcclees, MS, RD, LDN Pager: [phone] After Hours Pager: [phone]

## 2015-06-15 NOTE — Progress Notes (Signed)
Pt for discharge to [facility].  CSW facilitated pt discharge needs including contacting facility, faxing pt discharge information to facility, discussing with pt at bedside and pt son via telephone, providing RN phone number to call report, and arranging ambulance transport for pt back [facility].  No further social work needs identified at this time.  CSW signing off.   Loletta SpecterSuzanna Dailyn Kempner, MSW, LCSW Clinical Social Work [phone]

## 2015-06-15 NOTE — Evaluation (Signed)
Clinical/Bedside Swallow Evaluation Patient Details  Name: Adah SalvageWilma O Dauphin MRN: [id number] Date of Birth: Sep 20, 1923  Today's Date: 06/15/2015 Time: SLP Start Time (ACUTE ONLY): 0915 SLP Stop Time (ACUTE ONLY): 0925 SLP Time Calculation (min) (ACUTE ONLY): 10 min  Past Medical History:  Past Medical History  Diagnosis Date  . Hypertension   . Bilateral leg edema   . Hyperlipidemia    Past Surgical History:  Past Surgical History  Procedure Laterality Date  . Ankle fracture surgery    . Cholecystectomy     HPI: Pt admit with weakness - CXR negative.  PMH + for old CVAs, cervical osteophytes, obesity, AKI.  Pt reports poor appetite.     Assessment / Plan / Recommendation Clinical Impression  Pt presents with functional oropharyngeal swallow based on clinical swallow evaluation.  Adequate mastication with timely swallow and clear voice throughout intake.    She denies h/o CVA, although per CT pt has old right cerebellar and left basal ganglia CVA.    Pt states she has seen a swallow therapist approximately one month ago; SLP recommended she avoid straws and follow solids with liquids. Pt reports these strategies to be helpful - pointing to mid esophagus to indicate area of prior difficulty.  Pt does endorse poor appetite lately - encouraged her to assure adequate nutrition.     SLP did advise pt follow up with her dentist when she is able due to appearance of dental caries - most notably posterior right tooth.  Recommend continue regular/thin diet.     Aspiration Risk  No limitations    Diet Recommendation   regular/thin   Medication Administration: Whole meds with liquid    Other  Recommendations Oral Care Recommendations: Oral care BID   Follow up Recommendations  None    Frequency and Duration            Swallow Study   General Date of Onset: 06/15/15 Type of Study: Bedside Swallow Evaluation Diet Prior to this Study: Regular;Thin liquids Temperature Spikes Noted:  No Respiratory Status: Room air History of Recent Intubation: No Behavior/Cognition: Alert Oral Cavity Assessment: Within Functional Limits Oral Care Completed by SLP: No (pt eating breakfast) Oral Cavity - Dentition: Poor condition (appearance of caries on dentition, most predominantly posterior right) Vision: Functional for self-feeding Self-Feeding Abilities: Able to feed self Patient Positioning: Upright in bed Baseline Vocal Quality: Normal Volitional Cough: Strong    Oral/Motor/Sensory Function Overall Oral Motor/Sensory Function: Within functional limits   Ice Chips Ice chips: Not tested   Thin Liquid Thin Liquid: Within functional limits Presentation: Cup    Nectar Thick Nectar Thick Liquid: Not tested   Honey Thick Honey Thick Liquid: Not tested   Puree Puree: Not tested   Solid Other Comments: mixed consistency - cold cereal - functional       Donavan Burnetamara Judythe Postema, MS [facility] SLP [phone]

## 2015-06-15 NOTE — Discharge Instructions (Signed)

## 2015-06-16 ENCOUNTER — Non-Acute Institutional Stay (SKILLED_NURSING_FACILITY): Payer: Medicare Other | Admitting: Internal Medicine

## 2015-06-16 ENCOUNTER — Encounter: Payer: Self-pay | Admitting: Internal Medicine

## 2015-06-16 DIAGNOSIS — N179 Acute kidney failure, unspecified: Secondary | ICD-10-CM

## 2015-06-16 DIAGNOSIS — G9340 Encephalopathy, unspecified: Secondary | ICD-10-CM | POA: Diagnosis not present

## 2015-06-16 DIAGNOSIS — E038 Other specified hypothyroidism: Secondary | ICD-10-CM | POA: Diagnosis not present

## 2015-06-16 DIAGNOSIS — E034 Atrophy of thyroid (acquired): Secondary | ICD-10-CM

## 2015-06-16 DIAGNOSIS — I10 Essential (primary) hypertension: Secondary | ICD-10-CM

## 2015-06-16 DIAGNOSIS — N390 Urinary tract infection, site not specified: Secondary | ICD-10-CM

## 2015-06-16 DIAGNOSIS — D519 Vitamin B12 deficiency anemia, unspecified: Secondary | ICD-10-CM | POA: Diagnosis not present

## 2015-06-16 DIAGNOSIS — R1310 Dysphagia, unspecified: Secondary | ICD-10-CM | POA: Diagnosis not present

## 2015-06-16 DIAGNOSIS — N183 Chronic kidney disease, stage 3 unspecified: Secondary | ICD-10-CM

## 2015-06-16 LAB — URINE CULTURE

## 2015-06-16 NOTE — Assessment & Plan Note (Signed)
Likely secondary to combination of failure to thrive, poor oral intake, UTI - We are treating with rocephin in hospital and she will continue cipro on discharge for UTI

## 2015-06-16 NOTE — Assessment & Plan Note (Signed)
SNF - cont avapro and metoprolol;BP not controlled, inc noirvasc to 5 mg daily

## 2015-06-16 NOTE — Progress Notes (Signed)
MRN: [id number] Name: Kristine [name]  Sex: female Age: 81 y.o. DOB: 1923-10-03  [facility] Facility[facility] Level Of Care: SNF Provider: Merrilee SeashoreALEXANDER, Keila Turan D Emergency Contacts: Extended Emergency Contact Information Primary Emergency Contact: Canova,Nancy Address: [address]          [place] [id number] [place] Home Phone: [phone] Work Phone: [phone] Mobile Phone: [phone] Relation: Relative Secondary Emergency Contact: Hope,Bill Address: [address]          [place], [place] [address] [place] Home Phone: [phone] Mobile Phone: [phone] Relation: Son  Code Status:   Allergies: Amitriptyline; Buspar; Codeine; Dyazide; and Macrolides and ketolides  Chief Complaint  Patient presents with  . Readmit To SNF    HPI: Patient is 81 y.o. female with HTN, CKD stage III, HLD, resident of [facility] evaluation of confusion and noticeable weakness with poor oral intake, per nursing staff at the center. In ED, pt was hemodynamically stable but oxygen saturation was 85% on RA for unclear reason. UA was suggestive of UTI. Pt was admitted to hospital from 11/19-21 where she was txed for encaphalopathy and UTI with Rocephin then cipro. Course was complicated by AKI on CKD stage 3, resolved.Marland Kitchen.Marland Kitchen.Pt is admitted to SNF with generalized weakness and for residential care. While at SNF pt will be followed for HTN, tx with avapro, norvasc and coreg, hypothyroidism, tx with synthroid, and anemia, tx with B12 supplement.   Past Medical History  Diagnosis Date  . Hypertension   . Bilateral leg edema   . Hyperlipidemia     Past Surgical History  Procedure Laterality Date  . Ankle fracture surgery    . Cholecystectomy        Medication List       This list is accurate as of: 06/16/15 11:59 PM.  Always use your most recent med list.               acetaminophen 325 MG tablet  Commonly known as:   TYLENOL  Take 650 mg by mouth every 8 (eight) hours. Not to exceed 3G     alum & mag hydroxide-simeth 200-200-20 MG/5ML suspension  Commonly known as:  MAALOX/MYLANTA  Take 30 mLs by mouth every 6 (six) hours as needed for indigestion or heartburn (dyspepsia).     amLODipine 2.5 MG tablet  Commonly known as:  NORVASC  Take 2.5 mg by mouth daily.     antiseptic oral rinse Liqd  15 mLs by Mouth Rinse route 2 (two) times daily.     aspirin 81 MG chewable tablet  Chew 81 mg by mouth daily.     carboxymethylcellulose 0.5 % Soln  Commonly known as:  REFRESH PLUS  Place 1 drop into both eyes 2 (two) times daily.     carvedilol 25 MG tablet  Commonly known as:  COREG  Take 25 mg by mouth 2 (two) times daily with a meal. For HTN , NOTIFY PROVIDER IF BP IS OVER 160 FOR TWO CONSECUTIVE READINGS     ciprofloxacin 500 MG tablet  Commonly known as:  CIPRO  Take 1 tablet (500 mg total) by mouth 2 (two) times daily.     escitalopram 10 MG tablet  Commonly known as:  LEXAPRO  Take 10 mg by mouth daily. TAKE WITH 10 MG TABLET TO EQUAL A TOTAL DOSE OF 15 MG DAILY     escitalopram 5 MG tablet  Commonly known as:  LEXAPRO  Take 5 mg by mouth daily. TAKE WITH 10 MG TABLET TO EQUAL A TOTAL DOSE OF 15 MG DAILY     furosemide 20 MG tablet  Commonly known as:  LASIX  Take 20 mg by mouth daily as needed for edema (of the legs).     irbesartan 300 MG tablet  Commonly known as:  AVAPRO  Take 300 mg by mouth daily. For HTN     levothyroxine 150 MCG tablet  Commonly known as:  SYNTHROID, LEVOTHROID  Take 150 mcg by mouth daily before breakfast.     vitamin B-12 1000 MCG tablet  Commonly known as:  CYANOCOBALAMIN  Take 1,000 mcg by mouth daily.     Vitamin D3 50000 UNITS Caps  Take 1 capsule by mouth every 30 (thirty) days. TAKES ON THE 28TH OF EVERY MONTH        No orders of the defined types were placed in this encounter.    Immunization History  Administered Date(s) Administered  .  Influenza-Unspecified 05/06/2014  . PPD Test 04/07/2014    Social History  Substance Use Topics  . Smoking status: Never Smoker   . Smokeless tobacco: Not on file  . Alcohol Use: No    Family history is + prostate CA   Review of Systems  DATA OBTAINED: from patient, nurse GENERAL:  no fevers, fatigue, appetite changes SKIN: No itching, rash or wounds EYES: No eye pain, redness, discharge EARS: No earache, tinnitus, change in hearing NOSE: No congestion, drainage or bleeding  MOUTH/THROAT: No mouth or tooth pain, No sore throat RESPIRATORY: No cough, wheezing, SOB CARDIAC: No chest pain, palpitations, lower extremity edema  GI: No abdominal pain, No N/V/D or constipation, No heartburn or reflux  GU: No dysuria, frequency or urgency, or incontinence  MUSCULOSKELETAL: No unrelieved bone/joint pain NEUROLOGIC: No headache, dizziness or focal weakness PSYCHIATRIC: No c/o anxiety or sadness   Filed Vitals:   06/16/15 1438  BP: 174/110  Pulse: 63  Temp: 97.1 F (36.2 C)  Resp: 16    SpO2 Readings from Last 1 Encounters:  06/15/15 96%        Physical Exam  GENERAL APPEARANCE: Alert, conversant,  No acute distress, WF sitting in her WC in the hall SKIN: No diaphoresis rash HEAD: Normocephalic, atraumatic  EYES: Conjunctiva/lids clear. Pupils round, reactive. EOMs intact.  EARS: External exam WNL, canals clear. Hearing grossly normal.  NOSE: No deformity or discharge.  MOUTH/THROAT: Lips w/o lesions  RESPIRATORY: Breathing is even, unlabored. Lung sounds are clear   CARDIOVASCULAR: Heart RRR no murmurs, rubs or gallops. No peripheral edema.   GASTROINTESTINAL: Abdomen is soft, non-tender, not distended w/ normal bowel sounds. GENITOURINARY: Bladder non tender, not distended  MUSCULOSKELETAL: No abnormal joints or musculature NEUROLOGIC:  Cranial nerves 2-12 grossly intact. Moves all extremities  PSYCHIATRIC: Mood and affect appropriate to situation with dementia, no  behavioral issues  Patient Active Problem List   Diagnosis Date Noted  . Morbid obesity (HCC) 06/14/2015  . Benign essential HTN 06/14/2015  . Acute encephalopathy 06/13/2015  . Acute renal failure superimposed on stage 3 chronic kidney disease (HCC) 06/13/2015  . Dysphagia 06/13/2015  . UTI (lower urinary tract infection) 03/15/2014  . Hypothyroidism 02/23/2007  . Anemia, B12 deficiency 02/23/2007  . KIDNEY DISEASE, CHRONIC, STAGE III 02/23/2007    CBC    Component Value Date/Time   WBC 6.6 06/15/2015 0418   WBC 5.7 09/04/2014   RBC 3.60* 06/15/2015 0418   HGB 12.4 06/15/2015 0418  HCT 37.1 06/15/2015 0418   PLT 244 06/15/2015 0418   MCV 103.1* 06/15/2015 0418   LYMPHSABS 0.9 03/31/2014 2127   MONOABS 1.7* 03/31/2014 2127   EOSABS 0.2 03/31/2014 2127   BASOSABS 0.0 03/31/2014 2127    CMP     Component Value Date/Time   NA 138 06/15/2015 0418   NA 137 11/19/2014   K 4.1 06/15/2015 0418   CL 105 06/15/2015 0418   CO2 24 06/15/2015 0418   GLUCOSE 111* 06/15/2015 0418   BUN 21* 06/15/2015 0418   BUN 27* 11/19/2014   CREATININE 0.88 06/15/2015 0418   CALCIUM 8.8* 06/15/2015 0418   PROT 6.7 06/13/2015 1636   ALBUMIN 3.5 06/13/2015 1636   AST 20 06/13/2015 1636   ALT 16 06/13/2015 1636   ALKPHOS 79 06/13/2015 1636   BILITOT 0.8 06/13/2015 1636   GFRNONAA 57* 06/15/2015 0418   GFRAA >60 06/15/2015 0418    Lab Results  Component Value Date   HGBA1C 6.0 09/04/2014     Dg Chest 2 View  06/13/2015  CLINICAL DATA:  Shortness of breath.  Altered mental status. EXAM: CHEST  2 VIEW COMPARISON:  03/31/2014 chest radiograph. FINDINGS: Stable cardiomediastinal silhouette with mild cardiomegaly. No pneumothorax. No pleural effusion. Stable mild pleural-parenchymal scarring at the right lung apex. No focal lung consolidation. No pulmonary edema. IMPRESSION: Stable mild cardiomegaly without pulmonary edema. No active pulmonary disease. Electronically Signed   By: Delbert Phenix  M.D.   On: 06/13/2015 17:07   Ct Head Wo Contrast  06/13/2015  CLINICAL DATA:  Shortness of breath and altered mental status. EXAM: CT HEAD WITHOUT CONTRAST TECHNIQUE: Contiguous axial images were obtained from the base of the skull through the vertex without intravenous contrast. COMPARISON:  03/31/2014 FINDINGS: Sinuses/Soft tissues: Minimal motion degradation. Clear paranasal sinuses and mastoid air cells. Intracranial: Expected cerebral volume loss for age. Mild low density in the periventricular white matter likely related to small vessel disease. Remote left basal ganglia lacunar infarct. Remote right cerebellar infarct. No mass lesion, hemorrhage, hydrocephalus, acute infarct, intra-axial, or extra-axial fluid collection. IMPRESSION: 1.  No acute intracranial abnormality. 2. 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 3. Minimal motion degradation. Electronically Signed   By: Jeronimo Greaves M.D.   On: 06/13/2015 17:09    Not all labs, radiology exams or other studies done during hospitalization come through on my EPIC note; however they are reviewed by me.    Assessment and Plan  Acute encephalopathy Likely secondary to combination of failure to thrive, poor oral intake, UTI - We are treating with rocephin in hospital and she will continue cipro on discharge for UTI  UTI (lower urinary tract infection) - Has taken rocephin in hospital - Will continue cipro on discharge for 5 days    Acute renal failure superimposed on stage 3 chronic kidney disease (HCC) She is on as needed lasix which could have contributed to slightly elevated CR, 1.14 - Cr now WNL - May resume lasix as needed for edema on discharge    Benign essential HTN SNF - cont avapro and metoprolol;BP not controlled, inc noirvasc to 5 mg daily   Dysphagia SNF - ST to eval  Hypothyroidism SNF -= not reported as unstable; cont synthroid 150 mcg daily  Anemia, B12 deficiency D/c Hb 12.4; SNF -  cont B12 1000u po daily; will check CBC next week   Time spent 45 min;> 50% of time with patient was spent reviewing records, labs, tests and  studies, counseling and developing plan of care  Hennie Duos, MD

## 2015-06-16 NOTE — Assessment & Plan Note (Signed)
-   Has taken rocephin in hospital - Will continue cipro on discharge for 5 days

## 2015-06-16 NOTE — Assessment & Plan Note (Signed)
D/c Hb 12.4; SNF - cont B12 1000u po daily; will check CBC next week

## 2015-06-16 NOTE — Assessment & Plan Note (Signed)
SNF -= not reported as unstable; cont synthroid 150 mcg daily

## 2015-06-16 NOTE — Assessment & Plan Note (Signed)
She is on as needed lasix which could have contributed to slightly elevated CR, 1.14 - Cr now WNL - May resume lasix as needed for edema on discharge

## 2015-06-16 NOTE — Assessment & Plan Note (Signed)
SNF - ST to eval

## 2015-07-01 ENCOUNTER — Encounter: Payer: Self-pay | Admitting: Internal Medicine

## 2015-07-01 NOTE — Progress Notes (Signed)
This encounter was created in error - please disregard.

## 2015-07-07 ENCOUNTER — Non-Acute Institutional Stay (SKILLED_NURSING_FACILITY): Payer: Medicare Other | Admitting: Internal Medicine

## 2015-07-07 ENCOUNTER — Encounter: Payer: Self-pay | Admitting: Internal Medicine

## 2015-07-07 DIAGNOSIS — E034 Atrophy of thyroid (acquired): Secondary | ICD-10-CM | POA: Diagnosis not present

## 2015-07-07 DIAGNOSIS — N183 Chronic kidney disease, stage 3 unspecified: Secondary | ICD-10-CM

## 2015-07-07 DIAGNOSIS — I10 Essential (primary) hypertension: Secondary | ICD-10-CM | POA: Diagnosis not present

## 2015-07-07 DIAGNOSIS — E038 Other specified hypothyroidism: Secondary | ICD-10-CM | POA: Diagnosis not present

## 2015-07-07 NOTE — Assessment & Plan Note (Signed)
Controlled on Avapro 300 mg daily, coreg 25 mg BID and norvasc 2.5 mg daily ;plann - conr current regimen

## 2015-07-07 NOTE — Assessment & Plan Note (Signed)
In 06/23/2015 BUN 22/CR 0.9 with GFR 59, syable;plan - monitor at intervals

## 2015-07-07 NOTE — Assessment & Plan Note (Signed)
On 07/06/2015 TSH 1.17 on synthroid 150 mcg;plan - cont current dose and monitor at intervals

## 2015-07-07 NOTE — Progress Notes (Signed)
MRN: [id number] Name: Kristine [name]  Sex: female Age: 81 y.o. DOB: 1923-12-03  [facility] Facility/Room:206 Level Of Care: SNF Provider: Merrilee Seashore D Emergency Contacts: Extended Emergency Contact Information Primary Emergency Contact: Nase,Nancy Address: [address]          [place] [address] [place] Home Phone: [phone] Work Phone: [phone] Mobile Phone: [phone] Relation: Relative Secondary Emergency Contact: Jessop,Bill Address: [address]          [place], [place] [address] [place] Home Phone: [phone] Mobile Phone: [phone] Relation: Son  Code Status:   Allergies: Amitriptyline; Buspar; Codeine; Dyazide; and Macrolides and ketolides  Chief Complaint  Patient presents with  . Medical Management of Chronic Issues    HPI: Patient is 81 y.o. female with HTN, CKD stage III, HLD,who is being seen for routine issues of HTN, CKD, and hypothyroidism.  Past Medical History  Diagnosis Date  . Hypertension   . Bilateral leg edema   . Hyperlipidemia     Past Surgical History  Procedure Laterality Date  . Ankle fracture surgery    . Cholecystectomy        Medication List       This list is accurate as of: 07/07/15 11:59 PM.  Always use your most recent med list.               acetaminophen 325 MG tablet  Commonly known as:  TYLENOL  Take 650 mg by mouth every 8 (eight) hours. Not to exceed 3G     alum & mag hydroxide-simeth 200-200-20 MG/5ML suspension  Commonly known as:  MAALOX/MYLANTA  Take 30 mLs by mouth every 6 (six) hours as needed for indigestion or heartburn (dyspepsia).     amLODipine 2.5 MG tablet  Commonly known as:  NORVASC  Take 2.5 mg by mouth daily.     antiseptic oral rinse Liqd  15 mLs by Mouth Rinse route 2 (two) times daily.     aspirin 81 MG chewable tablet  Chew 81 mg by mouth daily.     carboxymethylcellulose 0.5 % Soln  Commonly known as:  REFRESH PLUS   Place 1 drop into both eyes 2 (two) times daily.     carvedilol 25 MG tablet  Commonly known as:  COREG  Take 25 mg by mouth 2 (two) times daily with a meal. For HTN , NOTIFY PROVIDER IF BP IS OVER 160 FOR TWO CONSECUTIVE READINGS     ciprofloxacin 500 MG tablet  Commonly known as:  CIPRO  Take 1 tablet (500 mg total) by mouth 2 (two) times daily.     escitalopram 10 MG tablet  Commonly known as:  LEXAPRO  Take 10 mg by mouth daily. TAKE WITH 10 MG TABLET TO EQUAL A TOTAL DOSE OF 15 MG DAILY     escitalopram 5 MG tablet  Commonly known as:  LEXAPRO  Take 5 mg by mouth daily. TAKE WITH 10 MG TABLET TO EQUAL A TOTAL DOSE OF 15 MG DAILY     furosemide 20 MG tablet  Commonly known as:  LASIX  Take 20 mg by mouth daily as needed for edema (of the legs).     irbesartan 300 MG tablet  Commonly known as:  AVAPRO  Take 300 mg by mouth daily. For HTN     levothyroxine 150 MCG tablet  Commonly known as:  SYNTHROID, LEVOTHROID  Take 150 mcg by mouth daily before breakfast.     vitamin  B-12 1000 MCG tablet  Commonly known as:  CYANOCOBALAMIN  Take 1,000 mcg by mouth daily.     Vitamin D3 50000 UNITS Caps  Take 1 capsule by mouth every 30 (thirty) days. TAKES ON THE 28TH OF EVERY MONTH        No orders of the defined types were placed in this encounter.    Immunization History  Administered Date(s) Administered  . Influenza-Unspecified 05/06/2014  . PPD Test 04/07/2014    Social History  Substance Use Topics  . Smoking status: Never Smoker   . Smokeless tobacco: Not on file  . Alcohol Use: No    Review of Systems  DATA OBTAINED: from patient, nurse GENERAL:  no fevers, fatigue, appetite changes SKIN: No itching, rash HEENT: No complaint RESPIRATORY: No cough, wheezing, SOB CARDIAC: No chest pain, palpitations, lower extremity edema  GI: No abdominal pain, No N/V/D or constipation, No heartburn or reflux  GU: No dysuria, frequency or urgency, or incontinence   MUSCULOSKELETAL: No unrelieved bone/joint pain NEUROLOGIC: No headache, dizziness  PSYCHIATRIC: No overt anxiety or sadness  Filed Vitals:   07/07/15 1626  BP: 139/65  Pulse: 70  Temp: 98 F (36.7 C)  Resp: 20    Physical Exam  GENERAL APPEARANCE: Alert, conversant, No acute distress, WF in WC in hall  SKIN: No diaphoresis rash HEENT: Unremarkable RESPIRATORY: Breathing is even, unlabored. Lung sounds are clear   CARDIOVASCULAR: Heart RRR no murmurs, rubs or gallops. No peripheral edema  GASTROINTESTINAL: Abdomen is soft, non-tender, not distended w/ normal bowel sounds.  GENITOURINARY: Bladder non tender, not distended  MUSCULOSKELETAL: No abnormal joints or musculature NEUROLOGIC: Cranial nerves 2-12 grossly intact. Moves all extremities PSYCHIATRIC: Mood and affect appropriate to situation with dementia, no behavioral issues  Patient Active Problem List   Diagnosis Date Noted  . Morbid obesity (HCC) 06/14/2015  . Benign essential HTN 06/14/2015  . Acute encephalopathy 06/13/2015  . Acute renal failure superimposed on stage 3 chronic kidney disease (HCC) 06/13/2015  . Dysphagia 06/13/2015  . UTI (lower urinary tract infection) 03/15/2014  . Hypothyroidism 02/23/2007  . Anemia, B12 deficiency 02/23/2007  . KIDNEY DISEASE, CHRONIC, STAGE III 02/23/2007    CBC    Component Value Date/Time   WBC 6.6 06/15/2015 0418   WBC 5.7 09/04/2014   RBC 3.60* 06/15/2015 0418   HGB 12.4 06/15/2015 0418   HCT 37.1 06/15/2015 0418   PLT 244 06/15/2015 0418   MCV 103.1* 06/15/2015 0418   LYMPHSABS 0.9 03/31/2014 2127   MONOABS 1.7* 03/31/2014 2127   EOSABS 0.2 03/31/2014 2127   BASOSABS 0.0 03/31/2014 2127    CMP     Component Value Date/Time   NA 138 06/15/2015 0418   NA 137 11/19/2014   K 4.1 06/15/2015 0418   CL 105 06/15/2015 0418   CO2 24 06/15/2015 0418   GLUCOSE 111* 06/15/2015 0418   BUN 21* 06/15/2015 0418   BUN 27* 11/19/2014   CREATININE 0.88 06/15/2015  0418   CALCIUM 8.8* 06/15/2015 0418   PROT 6.7 06/13/2015 1636   ALBUMIN 3.5 06/13/2015 1636   AST 20 06/13/2015 1636   ALT 16 06/13/2015 1636   ALKPHOS 79 06/13/2015 1636   BILITOT 0.8 06/13/2015 1636   GFRNONAA 57* 06/15/2015 0418   GFRAA >60 06/15/2015 0418    Assessment and Plan  Benign essential HTN Controlled on Avapro 300 mg daily, coreg 25 mg BID and norvasc 2.5 mg daily ;plann - conr current regimen  KIDNEY DISEASE, CHRONIC,  STAGE III In 06/23/2015 BUN 22/CR 0.9 with GFR 59, syable;plan - monitor at intervals  Hypothyroidism On 07/06/2015 TSH 1.17 on synthroid 150 mcg;plan - cont current dose and monitor at intervals    Margit Hanks, MD

## 2015-07-28 ENCOUNTER — Non-Acute Institutional Stay (SKILLED_NURSING_FACILITY): Payer: Medicare Other | Admitting: Internal Medicine

## 2015-07-28 ENCOUNTER — Encounter: Payer: Self-pay | Admitting: Internal Medicine

## 2015-07-28 DIAGNOSIS — R7303 Prediabetes: Secondary | ICD-10-CM | POA: Diagnosis not present

## 2015-07-28 DIAGNOSIS — I10 Essential (primary) hypertension: Secondary | ICD-10-CM | POA: Diagnosis not present

## 2015-07-28 NOTE — Progress Notes (Signed)
MRN: [id number] Name: Kristine [name]  Sex: female Age: 82 y.o. DOB: 04-16-1924  [facility] Facility[facility]Room: Level Of Care: SNF Provider: Merrilee SeashoreALEXANDER, Dusty Wagoner D Emergency Contacts: Extended Emergency Contact Information Primary Emergency Contact: Wilber,Nancy Address: [address]          [place] [id number] [place] Home Phone: [phone] Work Phone: [phone] Mobile Phone: [phone] Relation: Relative Secondary Emergency Contact: Holland,Bill Address: [address]          [place] [address] [place] Home Phone: [phone] Mobile Phone: [phone] Relation: Son  Code Status:   Allergies: Amitriptyline; Buspar; Codeine; Dyazide; and Macrolides and ketolides  Chief Complaint  Patient presents with  . Medical Management of Chronic Issues    HPI: Patient is 82 y.o. female with HTN, CKD stage III, HLD,who is being seen for routine issues of HTN, pre-diabetes and morbid obesity.   Past Medical History  Diagnosis Date  . Hypertension   . Bilateral leg edema   . Hyperlipidemia     Past Surgical History  Procedure Laterality Date  . Ankle fracture surgery    . Cholecystectomy        Medication List       This list is accurate as of: 07/28/15 11:59 PM.  Always use your most recent med list.               acetaminophen 325 MG tablet  Commonly known as:  TYLENOL  Take 650 mg by mouth every 8 (eight) hours. Not to exceed 3G     alum & mag hydroxide-simeth 200-200-20 MG/5ML suspension  Commonly known as:  MAALOX/MYLANTA  Take 30 mLs by mouth every 6 (six) hours as needed for indigestion or heartburn (dyspepsia).     amLODipine 2.5 MG tablet  Commonly known as:  NORVASC  Take 2.5 mg by mouth daily.     antiseptic oral rinse Liqd  15 mLs by Mouth Rinse route 2 (two) times daily.     aspirin 81 MG chewable tablet  Chew 81 mg by mouth daily.     carboxymethylcellulose 0.5 % Soln  Commonly known as:  REFRESH PLUS   Place 1 drop into both eyes 2 (two) times daily.     carvedilol 25 MG tablet  Commonly known as:  COREG  Take 25 mg by mouth 2 (two) times daily with a meal. For HTN , NOTIFY PROVIDER IF BP IS OVER 160 FOR TWO CONSECUTIVE READINGS     ciprofloxacin 500 MG tablet  Commonly known as:  CIPRO  Take 1 tablet (500 mg total) by mouth 2 (two) times daily.     escitalopram 10 MG tablet  Commonly known as:  LEXAPRO  Take 10 mg by mouth daily. TAKE WITH 10 MG TABLET TO EQUAL A TOTAL DOSE OF 15 MG DAILY     escitalopram 5 MG tablet  Commonly known as:  LEXAPRO  Take 5 mg by mouth daily. TAKE WITH 10 MG TABLET TO EQUAL A TOTAL DOSE OF 15 MG DAILY     furosemide 20 MG tablet  Commonly known as:  LASIX  Take 20 mg by mouth daily as needed for edema (of the legs).     irbesartan 300 MG tablet  Commonly known as:  AVAPRO  Take 300 mg by mouth daily. For HTN     levothyroxine 150 MCG tablet  Commonly known as:  SYNTHROID, LEVOTHROID  Take 150 mcg by mouth daily before breakfast.  vitamin B-12 1000 MCG tablet  Commonly known as:  CYANOCOBALAMIN  Take 1,000 mcg by mouth daily.     Vitamin D3 50000 units Caps  Take 1 capsule by mouth every 30 (thirty) days. TAKES ON THE 28TH OF EVERY MONTH        No orders of the defined types were placed in this encounter.    Immunization History  Administered Date(s) Administered  . Influenza-Unspecified 05/06/2014  . PPD Test 04/07/2014    Social History  Substance Use Topics  . Smoking status: Never Smoker   . Smokeless tobacco: Not on file  . Alcohol Use: No    Review of Systems  DATA OBTAINED: from patient, nurse GENERAL:  no fevers, fatigue, appetite changes SKIN: No itching, rash HEENT: No complaint RESPIRATORY: No cough, wheezing, SOB CARDIAC: No chest pain, palpitations, lower extremity edema  GI: No abdominal pain, No N/V/D or constipation, No heartburn or reflux  GU: No dysuria, frequency or urgency, or incontinence   MUSCULOSKELETAL: No unrelieved bone/joint pain NEUROLOGIC: No headache, dizziness  PSYCHIATRIC: No overt anxiety or sadness  Filed Vitals:   07/28/15 2134  BP: 139/65  Pulse: 81  Temp: 97 F (36.1 C)  Resp: 19    Physical Exam  GENERAL APPEARANCE: Alert, mod conversant, No acute distress  SKIN: No diaphoresis rash HEENT: Unremarkable RESPIRATORY: Breathing is even, unlabored. Lung sounds are clear   CARDIOVASCULAR: Heart RRR no murmurs, rubs or gallops. No peripheral edema  GASTROINTESTINAL: Abdomen is soft, non-tender, not distended w/ normal bowel sounds.  GENITOURINARY: Bladder non tender, not distended  MUSCULOSKELETAL: No abnormal joints or musculature NEUROLOGIC: Cranial nerves 2-12 grossly intact. Moves all extremities PSYCHIATRIC: Mood and affect appropriate to situation with dementia, no behavioral issues  Patient Active Problem List   Diagnosis Date Noted  . Pre-diabetes 08/02/2015  . Morbid obesity (HCC) 06/14/2015  . Benign essential HTN 06/14/2015  . Acute encephalopathy 06/13/2015  . Acute renal failure superimposed on stage 3 chronic kidney disease (HCC) 06/13/2015  . Dysphagia 06/13/2015  . UTI (lower urinary tract infection) 03/15/2014  . Hypothyroidism 02/23/2007  . Anemia, B12 deficiency 02/23/2007  . KIDNEY DISEASE, CHRONIC, STAGE III 02/23/2007    CBC    Component Value Date/Time   WBC 6.6 06/15/2015 0418   WBC 5.7 09/04/2014   RBC 3.60* 06/15/2015 0418   HGB 12.4 06/15/2015 0418   HCT 37.1 06/15/2015 0418   PLT 244 06/15/2015 0418   MCV 103.1* 06/15/2015 0418   LYMPHSABS 0.9 03/31/2014 2127   MONOABS 1.7* 03/31/2014 2127   EOSABS 0.2 03/31/2014 2127   BASOSABS 0.0 03/31/2014 2127    CMP     Component Value Date/Time   NA 138 06/15/2015 0418   NA 137 11/19/2014   K 4.1 06/15/2015 0418   CL 105 06/15/2015 0418   CO2 24 06/15/2015 0418   GLUCOSE 111* 06/15/2015 0418   BUN 21* 06/15/2015 0418   BUN 27* 11/19/2014   CREATININE 0.88  06/15/2015 0418   CALCIUM 8.8* 06/15/2015 0418   PROT 6.7 06/13/2015 1636   ALBUMIN 3.5 06/13/2015 1636   AST 20 06/13/2015 1636   ALT 16 06/13/2015 1636   ALKPHOS 79 06/13/2015 1636   BILITOT 0.8 06/13/2015 1636   GFRNONAA 57* 06/15/2015 0418   GFRAA >60 06/15/2015 0418    Assessment and Plan  Benign essential HTN Continues controlled on Avapro 300 mg daily, coreg 25 mg BID and norvasc 2.5 mg daily ;plann - cont current regimen  Morbid obesity (HCC) Pt has been seen by dietary and appropriate calorie and composition meal has been designed for pt; will monitor weight  Pre-diabetes Pt's A1c was 6.0; dietary has designed diet; will monitor a1c at intervals    Margit Hanks, MD

## 2015-08-02 DIAGNOSIS — R7303 Prediabetes: Secondary | ICD-10-CM | POA: Insufficient documentation

## 2015-08-02 NOTE — Assessment & Plan Note (Signed)
Continues controlled on Avapro 300 mg daily, coreg 25 mg BID and norvasc 2.5 mg daily ;plann - cont current regimen

## 2015-08-02 NOTE — Assessment & Plan Note (Signed)
Pt's A1c was 6.0; dietary has designed diet; will monitor a1c at intervals

## 2015-08-02 NOTE — Assessment & Plan Note (Signed)
Pt has been seen by dietary and appropriate calorie and composition meal has been designed for pt; will monitor weight

## 2015-08-05 LAB — BASIC METABOLIC PANEL
BUN: 31 mg/dL — AB (ref 4–21)
CREATININE: 0.8 mg/dL (ref <=1.1)
Glucose: 104 mg/dL
POTASSIUM: 4.7 mmol/L (ref 3.4–5.3)
SODIUM: 137 mmol/L (ref 137–147)

## 2015-09-09 ENCOUNTER — Non-Acute Institutional Stay (SKILLED_NURSING_FACILITY): Payer: Medicare Other | Admitting: Internal Medicine

## 2015-09-09 DIAGNOSIS — I10 Essential (primary) hypertension: Secondary | ICD-10-CM | POA: Diagnosis not present

## 2015-09-09 DIAGNOSIS — N182 Chronic kidney disease, stage 2 (mild): Secondary | ICD-10-CM

## 2015-09-09 DIAGNOSIS — E785 Hyperlipidemia, unspecified: Secondary | ICD-10-CM | POA: Diagnosis not present

## 2015-09-11 ENCOUNTER — Encounter: Payer: Self-pay | Admitting: *Deleted

## 2015-09-13 ENCOUNTER — Encounter: Payer: Self-pay | Admitting: Internal Medicine

## 2015-09-13 DIAGNOSIS — E785 Hyperlipidemia, unspecified: Secondary | ICD-10-CM | POA: Insufficient documentation

## 2015-09-13 NOTE — Assessment & Plan Note (Addendum)
Crestor non cvered by insurance and pt does not want to be on any lipid lowering agent at all; LDL off meds70, HDL-39 so it appears pt has made a good decision;plan - will monitor at intervals

## 2015-09-13 NOTE — Assessment & Plan Note (Signed)
Controlled without norvasc which was stopped due to pedal edema; plan - cont other med with prn lasix as needed; if pt will wear TED hose that would be helpful

## 2015-09-13 NOTE — Assessment & Plan Note (Signed)
Kidney fx has improved since pror; GFR 68;plan - will monitor at intervals

## 2015-09-13 NOTE — Progress Notes (Signed)
MRN: [id number] Name: Kristine [name]  Sex: female Age: 82 y.o. DOB: 11/26/1923  [facility] Facility/Room: Level Of Care: SNF Provider: Merrilee Seashore D Emergency Contacts: Extended Emergency Contact Information Primary Emergency Contact: Bulman,Nancy Address: [address]          [place] [address] [place] Home Phone: [phone] Work Phone: [phone] Mobile Phone: [phone] Relation: Relative Secondary Emergency Contact: Moening,Bill Address: [address]          [place], [place] [address] [place] Home Phone: [phone] Mobile Phone: [phone] Relation: Son  Code Status:   Allergies: Amitriptyline; Buspar; Codeine; Dyazide; and Macrolides and ketolides  Chief Complaint  Patient presents with  . Medical Management of Chronic Issues    HPI: Patient is 82 y.o. female who is being seen for routine issues of CKD2, HLD and HTN.  Past Medical History  Diagnosis Date  . Hypertension   . Bilateral leg edema   . Hyperlipidemia     Past Surgical History  Procedure Laterality Date  . Ankle fracture surgery    . Cholecystectomy        Medication List       This list is accurate as of: 09/09/15 11:59 PM.  Always use your most recent med list.               acetaminophen 325 MG tablet  Commonly known as:  TYLENOL  Take 650 mg by mouth every 8 (eight) hours. Not to exceed 3G     alum & mag hydroxide-simeth 200-200-20 MG/5ML suspension  Commonly known as:  MAALOX/MYLANTA  Take 30 mLs by mouth every 6 (six) hours as needed for indigestion or heartburn (dyspepsia).     amLODipine 2.5 MG tablet  Commonly known as:  NORVASC  Take 2.5 mg by mouth daily.     antiseptic oral rinse Liqd  15 mLs by Mouth Rinse route 2 (two) times daily.     aspirin 81 MG chewable tablet  Chew 81 mg by mouth daily.     carboxymethylcellulose 0.5 % Soln  Commonly known as:  REFRESH PLUS  Place 1 drop into both eyes 2 (two) times  daily.     carvedilol 25 MG tablet  Commonly known as:  COREG  Take 25 mg by mouth 2 (two) times daily with a meal. For HTN , NOTIFY PROVIDER IF BP IS OVER 160 FOR TWO CONSECUTIVE READINGS     escitalopram 10 MG tablet  Commonly known as:  LEXAPRO  Take 10 mg by mouth daily. TAKE WITH 10 MG TABLET TO EQUAL A TOTAL DOSE OF 15 MG DAILY     escitalopram 5 MG tablet  Commonly known as:  LEXAPRO  Take 5 mg by mouth daily. TAKE WITH 10 MG TABLET TO EQUAL A TOTAL DOSE OF 15 MG DAILY     furosemide 20 MG tablet  Commonly known as:  LASIX  Take 20 mg by mouth daily as needed for edema (of the legs).     irbesartan 300 MG tablet  Commonly known as:  AVAPRO  Take 300 mg by mouth daily. For HTN     levothyroxine 150 MCG tablet  Commonly known as:  SYNTHROID, LEVOTHROID  Take 150 mcg by mouth daily before breakfast.     vitamin B-12 1000 MCG tablet  Commonly known as:  CYANOCOBALAMIN  Take 1,000 mcg by mouth daily.     Vitamin D3 50000 units Caps  Take 1 capsule by mouth  every 30 (thirty) days. TAKES ON THE 28TH OF EVERY MONTH        No orders of the defined types were placed in this encounter.    Immunization History  Administered Date(s) Administered  . Influenza-Unspecified 05/06/2014  . PPD Test 04/07/2014    Social History  Substance Use Topics  . Smoking status: Never Smoker   . Smokeless tobacco: Not on file  . Alcohol Use: No    Review of Systems  DATA OBTAINED: from patient, nurse GENERAL:  no fevers, fatigue, appetite changes SKIN: No itching, rash HEENT: No complaint RESPIRATORY: No cough, wheezing, SOB; pt c/o runny nose CARDIAC: No chest pain, palpitations, lower extremity edema  GI: No abdominal pain, No N/V/D or constipation, No heartburn or reflux  GU: No dysuria, frequency or urgency, or incontinence  MUSCULOSKELETAL: No unrelieved bone/joint pain NEUROLOGIC: No headache, dizziness  PSYCHIATRIC: No overt anxiety or sadness  Filed Vitals:    09/13/15 1228  BP: 139/65  Pulse: 67  Temp: 96.6 F (35.9 C)  Resp: 20    Physical Exam  GENERAL APPEARANCE: Alert, conversant, No acute distress  SKIN: No diaphoresis rash, or wounds HEENT: Unremarkable RESPIRATORY: Breathing is even, unlabored. Lung sounds are clear   CARDIOVASCULAR: Heart RRR no murmurs, rubs or gallops. Trace  peripheral edema  GASTROINTESTINAL: Abdomen is soft, non-tender, not distended w/ normal bowel sounds.  GENITOURINARY: Bladder non tender, not distended  MUSCULOSKELETAL: No abnormal joints or musculature NEUROLOGIC: Cranial nerves 2-12 grossly intact. Moves all extremities PSYCHIATRIC: Mood and affect appropriate to situation with dementia, no behavioral issues  Patient Active Problem List   Diagnosis Date Noted  . Hyperlipidemia 09/13/2015  . Pre-diabetes 08/02/2015  . Morbid obesity (HCC) 06/14/2015  . Benign essential HTN 06/14/2015  . Acute encephalopathy 06/13/2015  . Acute renal failure superimposed on stage 3 chronic kidney disease (HCC) 06/13/2015  . Dysphagia 06/13/2015  . UTI (lower urinary tract infection) 03/15/2014  . Hypothyroidism 02/23/2007  . Anemia, B12 deficiency 02/23/2007  . CKD (chronic kidney disease) stage 2, GFR 60-89 ml/min 02/23/2007    CBC    Component Value Date/Time   WBC 6.6 06/15/2015 0418   WBC 5.7 09/04/2014   RBC 3.60* 06/15/2015 0418   HGB 12.4 06/15/2015 0418   HCT 37.1 06/15/2015 0418   PLT 244 06/15/2015 0418   MCV 103.1* 06/15/2015 0418   LYMPHSABS 0.9 03/31/2014 2127   MONOABS 1.7* 03/31/2014 2127   EOSABS 0.2 03/31/2014 2127   BASOSABS 0.0 03/31/2014 2127    CMP     Component Value Date/Time   NA 137 08/05/2015   NA 138 06/15/2015 0418   K 4.7 08/05/2015   CL 105 06/15/2015 0418   CO2 24 06/15/2015 0418   GLUCOSE 111* 06/15/2015 0418   BUN 31* 08/05/2015   BUN 21* 06/15/2015 0418   CREATININE 0.8 08/05/2015   CREATININE 0.88 06/15/2015 0418   CALCIUM 8.8* 06/15/2015 0418   PROT 6.7  06/13/2015 1636   ALBUMIN 3.5 06/13/2015 1636   AST 20 06/13/2015 1636   ALT 16 06/13/2015 1636   ALKPHOS 79 06/13/2015 1636   BILITOT 0.8 06/13/2015 1636   GFRNONAA 57* 06/15/2015 0418   GFRAA >60 06/15/2015 0418    Assessment and Plan  CKD (chronic kidney disease) stage 2, GFR 60-89 ml/min Kidney fx has improved since pror; GFR 68;plan - will monitor at intervals  Hyperlipidemia Crestor non cvered by insurance and pt does not want to be on any lipid lowering agent  at all; LDL off meds70, HDL-39 so it appears pt has made a good decision;plan - will monitor at intervals  Benign essential HTN Controlled without norvasc which was stopped due to pedal edema; plan - cont other med with prn lasix as needed; if pt will wear TED hose that would be helpful    Margit Hanks, MD

## 2015-10-02 ENCOUNTER — Non-Acute Institutional Stay (SKILLED_NURSING_FACILITY): Payer: Medicare Other | Admitting: Internal Medicine

## 2015-10-02 DIAGNOSIS — J189 Pneumonia, unspecified organism: Secondary | ICD-10-CM | POA: Diagnosis not present

## 2015-10-02 NOTE — Progress Notes (Signed)
MRN: [id number] Name: Kristine [name]  Sex: female Age: 82 y.o. DOB: February 23, 1924  [facility] #: Kristine [name] Facility/Room: Level Of Care: SNF Provider: Merrilee SeashoreALEXANDER, Marsella Suman D Emergency Contacts: Extended Emergency Contact Information Primary Emergency Contact: Sesay,Nancy Address: [address]          [place] [id number] [place] Home Phone: [phone] Work Phone: [phone] Mobile Phone: [phone] Relation: Relative Secondary Emergency Contact: Schmid,Bill Address: [address]          [place] [address] [place] Home Phone: [phone] Mobile Phone: [phone] Relation: Son  Code Status:   Allergies: Amitriptyline; Buspar; Codeine; Dyazide; and Macrolides and ketolides  Chief Complaint  Patient presents with  . Acute Visit    HPI: Patient is 82 y.o. female who the [facility] nurse asked me to see. She is presenting with a cough for about 3 days, but it could be longer because the pt does not like to complain. Her O2 sat is 93% on RA , it is usually 98%. She appears with some SOB just speaking. Dementia prevents much more history.  Past Medical History  Diagnosis Date  . Hypertension   . Bilateral leg edema   . Hyperlipidemia     Past Surgical History  Procedure Laterality Date  . Ankle fracture surgery    . Cholecystectomy        Medication List       This list is accurate as of: 10/02/15 11:59 PM.  Always use your most recent med list.               acetaminophen 325 MG tablet  Commonly known as:  TYLENOL  Take 650 mg by mouth every 8 (eight) hours. Not to exceed 3G     alum & mag hydroxide-simeth 200-200-20 MG/5ML suspension  Commonly known as:  MAALOX/MYLANTA  Take 30 mLs by mouth every 6 (six) hours as needed for indigestion or heartburn (dyspepsia).     amLODipine 2.5 MG tablet  Commonly known as:  NORVASC  Take 2.5 mg by mouth daily.     antiseptic oral rinse Liqd  15 mLs by Mouth Rinse route 2 (two) times  daily.     aspirin 81 MG chewable tablet  Chew 81 mg by mouth daily.     carboxymethylcellulose 0.5 % Soln  Commonly known as:  REFRESH PLUS  Place 1 drop into both eyes 2 (two) times daily.     carvedilol 25 MG tablet  Commonly known as:  COREG  Take 25 mg by mouth 2 (two) times daily with a meal. For HTN , NOTIFY PROVIDER IF BP IS OVER 160 FOR TWO CONSECUTIVE READINGS     escitalopram 10 MG tablet  Commonly known as:  LEXAPRO  Take 10 mg by mouth daily. TAKE WITH 10 MG TABLET TO EQUAL A TOTAL DOSE OF 15 MG DAILY     escitalopram 5 MG tablet  Commonly known as:  LEXAPRO  Take 5 mg by mouth daily. TAKE WITH 10 MG TABLET TO EQUAL A TOTAL DOSE OF 15 MG DAILY     furosemide 20 MG tablet  Commonly known as:  LASIX  Take 20 mg by mouth daily as needed for edema (of the legs).     irbesartan 300 MG tablet  Commonly known as:  AVAPRO  Take 300 mg by mouth daily. For HTN     levothyroxine 150 MCG tablet  Commonly known as:  SYNTHROID, LEVOTHROID  Take 150 mcg by mouth  daily before breakfast.     vitamin B-12 1000 MCG tablet  Commonly known as:  CYANOCOBALAMIN  Take 1,000 mcg by mouth daily.     Vitamin D3 50000 units Caps  Take 1 capsule by mouth every 30 (thirty) days. TAKES ON THE 28TH OF EVERY MONTH        No orders of the defined types were placed in this encounter.    Immunization History  Administered Date(s) Administered  . Influenza-Unspecified 05/06/2014  . PPD Test 04/07/2014    Social History  Substance Use Topics  . Smoking status: Never Smoker   . Smokeless tobacco: Not on file  . Alcohol Use: No    Review of Systems  DATA OBTAINED: from patient,[facility] nurse GENERAL:  no fevers, fatigue, appetite changes SKIN: No itching, rash HEENT: No complaint RESPIRATORY: + cough, no wheezing, SOB CARDIAC: No chest pain, palpitations, lower extremity edema  GI: No abdominal pain, No N/V/D or constipation, No heartburn or reflux  GU: No dysuria, frequency or  urgency, or incontinence  MUSCULOSKELETAL: No unrelieved bone/joint pain NEUROLOGIC: No headache, dizziness  PSYCHIATRIC: No overt anxiety or sadness  Filed Vitals:   10/04/15 2059  BP: 139/65  Pulse: 84  Temp: 97 F (36.1 C)  Resp: 17    Physical Exam  GENERAL APPEARANCE: ,  Min conversant, trying to be cheerful but obviously sick,  not toxic  SKIN: No diaphoresis rash HEENT: Unremarkable RESPIRATORY: Breathing is even, unlabored. Lung sounds are decreased on L   CARDIOVASCULAR: Heart RRR no murmurs, rubs or gallops. No peripheral edema  GASTROINTESTINAL: Abdomen is soft, non-tender, not distended w/ normal bowel sounds.  GENITOURINARY: Bladder non tender, not distended  MUSCULOSKELETAL: No abnormal joints or musculature NEUROLOGIC: Cranial nerves 2-12 grossly intact. Moves all extremities PSYCHIATRIC: Mood and affect appropriate to situation with dementia, no behavioral issues  Patient Active Problem List   Diagnosis Date Noted  . HCAP (healthcare-associated pneumonia) 10/04/2015  . Hyperlipidemia 09/13/2015  . Pre-diabetes 08/02/2015  . Morbid obesity (HCC) 06/14/2015  . Benign essential HTN 06/14/2015  . Acute encephalopathy 06/13/2015  . Acute renal failure superimposed on stage 3 chronic kidney disease (HCC) 06/13/2015  . Dysphagia 06/13/2015  . UTI (lower urinary tract infection) 03/15/2014  . Hypothyroidism 02/23/2007  . Anemia, B12 deficiency 02/23/2007  . CKD (chronic kidney disease) stage 2, GFR 60-89 ml/min 02/23/2007    CBC    Component Value Date/Time   WBC 6.6 06/15/2015 0418   WBC 5.7 09/04/2014   RBC 3.60* 06/15/2015 0418   HGB 12.4 06/15/2015 0418   HCT 37.1 06/15/2015 0418   PLT 244 06/15/2015 0418   MCV 103.1* 06/15/2015 0418   LYMPHSABS 0.9 03/31/2014 2127   MONOABS 1.7* 03/31/2014 2127   EOSABS 0.2 03/31/2014 2127   BASOSABS 0.0 03/31/2014 2127    CMP     Component Value Date/Time   NA 137 08/05/2015   NA 138 06/15/2015 0418   K  4.7 08/05/2015   CL 105 06/15/2015 0418   CO2 24 06/15/2015 0418   GLUCOSE 111* 06/15/2015 0418   BUN 31* 08/05/2015   BUN 21* 06/15/2015 0418   CREATININE 0.8 08/05/2015   CREATININE 0.88 06/15/2015 0418   CALCIUM 8.8* 06/15/2015 0418   PROT 6.7 06/13/2015 1636   ALBUMIN 3.5 06/13/2015 1636   AST 20 06/13/2015 1636   ALT 16 06/13/2015 1636   ALKPHOS 79 06/13/2015 1636   BILITOT 0.8 06/13/2015 1636   GFRNONAA 57* 06/15/2015 0418   GFRAA >  60 06/15/2015 0418    Assessment and Plan  HCAP (healthcare-associated pneumonia) Clinically pt with PNA. Orders have been written to get her in bed, for O2, labs, CXR and rapid flu. Planning to treat pt empirically. Her CrCl is 45. Will treat with levaquin 750 mg q 48 hours for 4 doses after starting with rocephin 1 gm IM today.   Time spent 40 min;> 50% of time with patient was spent reviewing records, labs, tests and studies, counseling and developing plan of care  Margit Hanks, MD

## 2015-10-04 ENCOUNTER — Encounter: Payer: Self-pay | Admitting: Internal Medicine

## 2015-10-04 DIAGNOSIS — J189 Pneumonia, unspecified organism: Secondary | ICD-10-CM | POA: Insufficient documentation

## 2015-10-04 NOTE — Assessment & Plan Note (Signed)
Clinically pt with PNA. Orders have been written to get her in bed, for O2, labs, CXR and rapid flu. Planning to treat pt empirically. Her CrCl is 45. Will treat with levaquin 750 mg q 48 hours for 4 doses after starting with rocephin 1 gm IM today.

## 2015-10-05 LAB — BASIC METABOLIC PANEL
BUN: 22 mg/dL — AB (ref 4–21)
Creatinine: 1 mg/dL (ref 0.5–1.1)
Glucose: 111 mg/dL
POTASSIUM: 4.5 mmol/L (ref 3.4–5.3)
Sodium: 137 mmol/L (ref 137–147)

## 2015-10-05 LAB — CBC AND DIFFERENTIAL
HEMATOCRIT: 33 % — AB (ref 36–46)
HEMOGLOBIN: 12.5 g/dL (ref 12.0–16.0)
PLATELETS: 240 10*3/uL (ref 150–399)
WBC: 7.7 10*3/mL

## 2015-10-05 LAB — HEPATIC FUNCTION PANEL
ALT: 8 U/L (ref 7–35)
AST: 14 U/L (ref 13–35)
Alkaline Phosphatase: 71 U/L (ref 25–125)
BILIRUBIN, TOTAL: 0.5 mg/dL

## 2015-11-18 ENCOUNTER — Non-Acute Institutional Stay (SKILLED_NURSING_FACILITY): Payer: Medicare Other | Admitting: Internal Medicine

## 2015-11-18 DIAGNOSIS — D519 Vitamin B12 deficiency anemia, unspecified: Secondary | ICD-10-CM

## 2015-11-18 DIAGNOSIS — F329 Major depressive disorder, single episode, unspecified: Secondary | ICD-10-CM | POA: Diagnosis not present

## 2015-11-18 DIAGNOSIS — F32A Depression, unspecified: Secondary | ICD-10-CM

## 2015-11-18 DIAGNOSIS — E785 Hyperlipidemia, unspecified: Secondary | ICD-10-CM | POA: Diagnosis not present

## 2015-11-20 LAB — LIPID PANEL
Cholesterol: 202 mg/dL — AB (ref 0–200)
HDL: 43 mg/dL (ref 35–70)
LDL Cholesterol: 132 mg/dL
TRIGLYCERIDES: 137 mg/dL (ref 40–160)

## 2015-11-20 LAB — CBC AND DIFFERENTIAL
HCT: 39 % (ref 36–46)
Hemoglobin: 12.9 g/dL (ref 12.0–16.0)
PLATELETS: 259 10*3/uL (ref 150–399)
WBC: 5.9 10^3/mL

## 2015-11-20 LAB — BASIC METABOLIC PANEL
BUN: 25 mg/dL — AB (ref 4–21)
CREATININE: 1 mg/dL (ref 0.5–1.1)
Glucose: 103 mg/dL
Potassium: 4.4 mmol/L (ref 3.4–5.3)
Sodium: 139 mmol/L (ref 137–147)

## 2015-11-20 LAB — HEPATIC FUNCTION PANEL
ALT: 8 U/L (ref 7–35)
AST: 14 U/L (ref 13–35)
Alkaline Phosphatase: 73 U/L (ref 25–125)
BILIRUBIN, TOTAL: 0.4 mg/dL

## 2015-11-25 ENCOUNTER — Encounter: Payer: Self-pay | Admitting: Internal Medicine

## 2015-11-25 NOTE — Assessment & Plan Note (Signed)
New labs, MCV is still, improved from prior; cont B12 replacement  1000u daily 104 but Hb 12.9

## 2015-11-25 NOTE — Progress Notes (Signed)
MRN: [id number] Name: Kristine [name]  Sex: female Age: 82 y.o. DOB: 01-02-1924  [facility] #: [facility] Facility/Room: Level Of Care: SNF Provider: Merrilee Seashore D Emergency Contacts: Extended Emergency Contact Information Primary Emergency Contact: Quintela,Nancy Address: [address]          [place] [address] [place] Home Phone: [phone] Work Phone: [phone] Mobile Phone: [phone] Relation: Relative Secondary Emergency Contact: Stella,Bill Address: [address]          [place], [place] [address] [place] Home Phone: [phone] Mobile Phone: [phone] Relation: Son  Code Status:   Allergies: Amitriptyline; Buspar; Codeine; Dyazide; and Macrolides and ketolides  Chief Complaint  Patient presents with  . Medical Management of Chronic Issues    HPI: Patient is 82 y.o. female who is being seen for routine issues of HLD, depression and B12 deficiency.  Past Medical History  Diagnosis Date  . Hypertension   . Bilateral leg edema   . Hyperlipidemia     Past Surgical History  Procedure Laterality Date  . Ankle fracture surgery    . Cholecystectomy        Medication List       This list is accurate as of: 11/18/15 11:59 PM.  Always use your most recent med list.               acetaminophen 325 MG tablet  Commonly known as:  TYLENOL  Take 650 mg by mouth every 8 (eight) hours. Not to exceed 3G     alum & mag hydroxide-simeth 200-200-20 MG/5ML suspension  Commonly known as:  MAALOX/MYLANTA  Take 30 mLs by mouth every 6 (six) hours as needed for indigestion or heartburn (dyspepsia).     amLODipine 2.5 MG tablet  Commonly known as:  NORVASC  Take 2.5 mg by mouth daily.     antiseptic oral rinse Liqd  15 mLs by Mouth Rinse route 2 (two) times daily.     aspirin 81 MG chewable tablet  Chew 81 mg by mouth daily.     carboxymethylcellulose 0.5 % Soln  Commonly known as:  REFRESH PLUS  Place 1 drop into both eyes  2 (two) times daily.     carvedilol 25 MG tablet  Commonly known as:  COREG  Take 25 mg by mouth 2 (two) times daily with a meal. For HTN , NOTIFY PROVIDER IF BP IS OVER 160 FOR TWO CONSECUTIVE READINGS     escitalopram 10 MG tablet  Commonly known as:  LEXAPRO  Take 10 mg by mouth daily. TAKE WITH 10 MG TABLET TO EQUAL A TOTAL DOSE OF 15 MG DAILY     escitalopram 5 MG tablet  Commonly known as:  LEXAPRO  Take 5 mg by mouth daily. TAKE WITH 10 MG TABLET TO EQUAL A TOTAL DOSE OF 15 MG DAILY     furosemide 20 MG tablet  Commonly known as:  LASIX  Take 20 mg by mouth daily as needed for edema (of the legs).     irbesartan 300 MG tablet  Commonly known as:  AVAPRO  Take 300 mg by mouth daily. For HTN     levothyroxine 150 MCG tablet  Commonly known as:  SYNTHROID, LEVOTHROID  Take 150 mcg by mouth daily before breakfast.     vitamin B-12 1000 MCG tablet  Commonly known as:  CYANOCOBALAMIN  Take 1,000 mcg by mouth daily.     Vitamin D3 50000 units Caps  Take 1 capsule by  mouth every 30 (thirty) days. TAKES ON THE 28TH OF EVERY MONTH        No orders of the defined types were placed in this encounter.    Immunization History  Administered Date(s) Administered  . Influenza-Unspecified 05/06/2014  . PPD Test 04/07/2014    Social History  Substance Use Topics  . Smoking status: Never Smoker   . Smokeless tobacco: Not on file  . Alcohol Use: No    Review of Systems   UTO 2/2 dementia    Filed Vitals:   11/25/15 1912  BP: 135/79  Pulse: 76  Temp: 97.1 F (36.2 C)  Resp: 18    Physical Exam  GENERAL APPEARANCE: Alert, conversant, No acute distress , [facility] SKIN: No diaphoresis rash HEENT: Unremarkable RESPIRATORY: Breathing is even, unlabored. Lung sounds are clear   CARDIOVASCULAR: Heart RRR no murmurs, rubs or gallops. No peripheral edema  GASTROINTESTINAL: Abdomen is soft, non-tender, not distended w/ normal bowel sounds.  GENITOURINARY:  Bladder non tender, not distended  MUSCULOSKELETAL: No abnormal joints or musculature NEUROLOGIC: Cranial nerves 2-12 grossly intact. Moves all extremities PSYCHIATRIC: Mood and affect appropriate to situation with dementia, no behavioral issues  Patient Active Problem List   Diagnosis Date Noted  . HCAP (healthcare-associated pneumonia) 10/04/2015  . Hyperlipidemia 09/13/2015  . Pre-diabetes 08/02/2015  . Morbid obesity (HCC) 06/14/2015  . Benign essential HTN 06/14/2015  . Acute encephalopathy 06/13/2015  . Acute renal failure superimposed on stage 3 chronic kidney disease (HCC) 06/13/2015  . Dysphagia 06/13/2015  . Depression 03/18/2015  . UTI (lower urinary tract infection) 03/15/2014  . Hypothyroidism 02/23/2007  . Anemia, B12 deficiency 02/23/2007  . CKD (chronic kidney disease) stage 2, GFR 60-89 ml/min 02/23/2007    CBC    Component Value Date/Time   WBC 6.6 06/15/2015 0418   WBC 5.7 09/04/2014   RBC 3.60* 06/15/2015 0418   HGB 12.4 06/15/2015 0418   HCT 37.1 06/15/2015 0418   PLT 244 06/15/2015 0418   MCV 103.1* 06/15/2015 0418   LYMPHSABS 0.9 03/31/2014 2127   MONOABS 1.7* 03/31/2014 2127   EOSABS 0.2 03/31/2014 2127   BASOSABS 0.0 03/31/2014 2127    CMP     Component Value Date/Time   NA 137 08/05/2015   NA 138 06/15/2015 0418   K 4.7 08/05/2015   CL 105 06/15/2015 0418   CO2 24 06/15/2015 0418   GLUCOSE 111* 06/15/2015 0418   BUN 31* 08/05/2015   BUN 21* 06/15/2015 0418   CREATININE 0.8 08/05/2015   CREATININE 0.88 06/15/2015 0418   CALCIUM 8.8* 06/15/2015 0418   PROT 6.7 06/13/2015 1636   ALBUMIN 3.5 06/13/2015 1636   AST 20 06/13/2015 1636   ALT 16 06/13/2015 1636   ALKPHOS 79 06/13/2015 1636   BILITOT 0.8 06/13/2015 1636   GFRNONAA 57* 06/15/2015 0418   GFRAA >60 06/15/2015 0418    Assessment and Plan  Hyperlipidemia Off lipids new labs LDL 132 and HDL 43; reasonably acceptable; will cont to moniotr at intervals  Depression Pt is  content and out in hall daily socializing;plan - cont laxapro, now at 15 mg daily  Anemia, B12 deficiency New labs, MCV is still, improved from prior; cont B12 replacement  1000u daily 104 but Hb 12.9    Margit HanksALEXANDER, Lucienne Sawyers D, MD

## 2015-11-25 NOTE — Assessment & Plan Note (Signed)
Off lipids new labs LDL 132 and HDL 43; reasonably acceptable; will cont to moniotr at intervals

## 2015-11-25 NOTE — Assessment & Plan Note (Signed)
Pt is content and out in hall daily socializing;plan - cont laxapro, now at 15 mg daily

## 2015-12-08 LAB — TSH: TSH: 3.12 u[IU]/mL (ref 0.41–5.90)

## 2015-12-15 ENCOUNTER — Encounter: Payer: Self-pay | Admitting: Internal Medicine

## 2015-12-15 ENCOUNTER — Non-Acute Institutional Stay (SKILLED_NURSING_FACILITY): Payer: Medicare Other | Admitting: Internal Medicine

## 2015-12-15 DIAGNOSIS — D519 Vitamin B12 deficiency anemia, unspecified: Secondary | ICD-10-CM

## 2015-12-15 DIAGNOSIS — E038 Other specified hypothyroidism: Secondary | ICD-10-CM | POA: Diagnosis not present

## 2015-12-15 DIAGNOSIS — I10 Essential (primary) hypertension: Secondary | ICD-10-CM | POA: Diagnosis not present

## 2015-12-15 DIAGNOSIS — E034 Atrophy of thyroid (acquired): Secondary | ICD-10-CM

## 2015-12-15 NOTE — Progress Notes (Signed)
MRN: [id number] Name: Kristine [name]  Sex: female Age: 82 y.o. DOB: 03-20-1924  [facility] #: [facility] Facility/Room:206 D Level Of Care: SNF Provider: Randon Goldsmith. Lyn Hollingshead, MD Emergency Contacts: Extended Emergency Contact Information Primary Emergency Contact: Alyshia, Kernan Address: [address]          [place] [address] [place] Home Phone: [phone] Work Phone: [phone] Mobile Phone: [phone] Relation: Relative Secondary Emergency Contact: Siravo,Bill Address: [address]          [place], [place] [address] [place] Home Phone: [phone] Mobile Phone: [phone] Relation: Son  Code Status: Full Code   Allergies: Amitriptyline; Buspar; Codeine; Dyazide; and Macrolides and ketolides  Chief Complaint  Patient presents with  . Medical Management of Chronic Issues    Routine visit    HPI: Patient is 82 y.o. female who is being seen for routine issues of HTN, hypothyroidism, and Vit B12.  Past Medical History  Diagnosis Date  . Hypertension   . Bilateral leg edema   . Hyperlipidemia     Past Surgical History  Procedure Laterality Date  . Ankle fracture surgery    . Cholecystectomy        Medication List       This list is accurate as of: 12/15/15 11:59 PM.  Always use your most recent med list.               acetaminophen 325 MG tablet  Commonly known as:  TYLENOL  Take 650 mg by mouth every 8 (eight) hours. Not to exceed 3G     ALPRAZolam 0.25 MG tablet  Commonly known as:  XANAX  Take 0.25 mg by mouth every 8 (eight) hours as needed for anxiety.     alum & mag hydroxide-simeth 200-200-20 MG/5ML suspension  Commonly known as:  MAALOX/MYLANTA  Take 30 mLs by mouth every 6 (six) hours as needed for indigestion or heartburn (dyspepsia).     antiseptic oral rinse Liqd  15 mLs by Mouth Rinse route 2 (two) times daily.     aspirin 81 MG chewable tablet  Chew 81 mg by mouth daily.     carboxymethylcellulose  0.5 % Soln  Commonly known as:  REFRESH PLUS  Place 1 drop into both eyes 2 (two) times daily.     carvedilol 25 MG tablet  Commonly known as:  COREG  Take 25 mg by mouth 2 (two) times daily with a meal. For HTN , NOTIFY PROVIDER IF BP IS OVER 160 FOR TWO CONSECUTIVE READINGS     escitalopram 20 MG tablet  Commonly known as:  LEXAPRO  Take 20 mg by mouth daily.     furosemide 20 MG tablet  Commonly known as:  LASIX  Take 20 mg by mouth daily as needed for edema (of the legs).     ipratropium-albuterol 0.5-2.5 (3) MG/3ML Soln  Commonly known as:  DUONEB  Take 3 mLs by nebulization every 8 (eight) hours as needed. For wheezing , congestion, and dyspnea     irbesartan 300 MG tablet  Commonly known as:  AVAPRO  Take 300 mg by mouth daily. For HTN     levothyroxine 150 MCG tablet  Commonly known as:  SYNTHROID, LEVOTHROID  Take 150 mcg by mouth daily before breakfast.     ondansetron 4 MG tablet  Commonly known as:  ZOFRAN  Take 4 mg by mouth every 6 (six) hours as needed for nausea or vomiting.     traMADol 50  MG tablet  Commonly known as:  ULTRAM  Take 50 mg by mouth every 12 (twelve) hours as needed for moderate pain.     vitamin B-12 1000 MCG tablet  Commonly known as:  CYANOCOBALAMIN  Take 1,000 mcg by mouth daily.     Vitamin D3 50000 units Caps  Take 1 capsule by mouth every 30 (thirty) days. TAKES ON THE 28TH OF EVERY MONTH        No orders of the defined types were placed in this encounter.    Immunization History  Administered Date(s) Administered  . Influenza-Unspecified 05/06/2014  . PPD Test 03/18/2014, 04/07/2014    Social History  Substance Use Topics  . Smoking status: Never Smoker   . Smokeless tobacco: Not on file  . Alcohol Use: No    Review of Systems  UT fully participate 2/2 dementia; nursing without issues    Filed Vitals:   12/15/15 1624  BP: 146/78  Pulse: 81  Temp: 97.3 F (36.3 C)  Resp: 16    Physical Exam  GENERAL  APPEARANCE: Alert, mod conversant, No acute distress; happy in her WC in the hall SKIN: No diaphoresis rash HEENT: Unremarkable RESPIRATORY: Breathing is even, unlabored. Lung sounds are clear   CARDIOVASCULAR: Heart RRR no murmurs, rubs or gallops. No peripheral edema  GASTROINTESTINAL: Abdomen is soft, non-tender, not distended w/ normal bowel sounds.  GENITOURINARY: Bladder non tender, not distended  MUSCULOSKELETAL: No abnormal joints or musculature NEUROLOGIC: Cranial nerves 2-12 grossly intact. Moves all extremities PSYCHIATRIC: Mood and affect appropriate to situation with dementia, no behavioral issues  Patient Active Problem List   Diagnosis Date Noted  . HCAP (healthcare-associated pneumonia) 10/04/2015  . Hyperlipidemia 09/13/2015  . Pre-diabetes 08/02/2015  . Morbid obesity (HCC) 06/14/2015  . Benign essential HTN 06/14/2015  . Acute encephalopathy 06/13/2015  . Acute renal failure superimposed on stage 3 chronic kidney disease (HCC) 06/13/2015  . Dysphagia 06/13/2015  . Depression 03/18/2015  . UTI (lower urinary tract infection) 03/15/2014  . Hypothyroidism 02/23/2007  . Anemia, B12 deficiency 02/23/2007  . CKD (chronic kidney disease) stage 2, GFR 60-89 ml/min 02/23/2007    CBC    Component Value Date/Time   WBC 5.9 11/20/2015   WBC 6.6 06/15/2015 0418   RBC 3.60* 06/15/2015 0418   HGB 12.9 11/20/2015   HCT 39 11/20/2015   PLT 259 11/20/2015   MCV 103.1* 06/15/2015 0418   LYMPHSABS 0.9 03/31/2014 2127   MONOABS 1.7* 03/31/2014 2127   EOSABS 0.2 03/31/2014 2127   BASOSABS 0.0 03/31/2014 2127    CMP     Component Value Date/Time   NA 139 11/20/2015   NA 138 06/15/2015 0418   K 4.4 11/20/2015   CL 105 06/15/2015 0418   CO2 24 06/15/2015 0418   GLUCOSE 111* 06/15/2015 0418   BUN 25* 11/20/2015   BUN 21* 06/15/2015 0418   CREATININE 1.0 11/20/2015   CREATININE 0.88 06/15/2015 0418   CALCIUM 8.8* 06/15/2015 0418   PROT 6.7 06/13/2015 1636   ALBUMIN  3.5 06/13/2015 1636   AST 14 11/20/2015   ALT 8 11/20/2015   ALKPHOS 73 11/20/2015   BILITOT 0.8 06/13/2015 1636   GFRNONAA 57* 06/15/2015 0418   GFRAA >60 06/15/2015 0418    Assessment and Plan  Benign essential HTN cONTROLLED ON AVAPRO 300 MG DAILY;PLAN - CONT CURRENT MED  Hypothyroidism Chronic and stable' cont 150 mcg daily  Anemia, B12 deficiency Chronic and improving; cont b12 replacement 1000 mcg daily  Randon Goldsmith. Lyn Hollingshead, MD

## 2015-12-18 NOTE — Assessment & Plan Note (Signed)
Chronic and stable' cont 150 mcg daily

## 2015-12-18 NOTE — Assessment & Plan Note (Signed)
Chronic and improving; cont b12 replacement 1000 mcg daily

## 2015-12-18 NOTE — Assessment & Plan Note (Signed)
cONTROLLED ON AVAPRO 300 MG DAILY;PLAN - CONT CURRENT MED

## 2016-01-13 ENCOUNTER — Non-Acute Institutional Stay (SKILLED_NURSING_FACILITY): Payer: Medicare Other | Admitting: Internal Medicine

## 2016-01-13 ENCOUNTER — Encounter: Payer: Self-pay | Admitting: Internal Medicine

## 2016-01-13 DIAGNOSIS — N182 Chronic kidney disease, stage 2 (mild): Secondary | ICD-10-CM

## 2016-01-13 DIAGNOSIS — E785 Hyperlipidemia, unspecified: Secondary | ICD-10-CM | POA: Diagnosis not present

## 2016-01-13 DIAGNOSIS — I10 Essential (primary) hypertension: Secondary | ICD-10-CM

## 2016-01-13 NOTE — Progress Notes (Signed)
MRN: [id number] Name: Kristine [name]  Sex: female Age: 82 y.o. DOB: 29-Jul-1923  PSC #:  Facility/Room: [facility] / [facility] D Level Of Care: SNF Provider: Randon Goldsmith. Lyn Hollingshead, MD Emergency Contacts: Extended Emergency Contact Information Primary Emergency Contact: Kristine [name], Kristine [name] Address: [address]          [place] [address] [place] Home Phone: [phone] Work Phone: [phone] Mobile Phone: [phone] Relation: Relative Secondary Emergency Contact: Seaman,Bill Address: [address]          [place], [place] [address] [place] Home Phone: [phone] Mobile Phone: [phone] Relation: Son  Code Status: Full Code  Allergies: Amitriptyline; Buspar; Codeine; Dyazide; and Macrolides and ketolides  Chief Complaint  Patient presents with  . Medical Management of Chronic Issues    Routine Visit    HPI: Patient is 82 y.o. female who is being seen for routine issues of HTN, CKD3 and HLD.  Past Medical History  Diagnosis Date  . Hypertension   . Bilateral leg edema   . Hyperlipidemia     Past Surgical History  Procedure Laterality Date  . Ankle fracture surgery    . Cholecystectomy        Medication List       This list is accurate as of: 01/13/16 11:59 PM.  Always use your most recent med list.               acetaminophen 325 MG tablet  Commonly known as:  TYLENOL  Take 650 mg by mouth every 8 (eight) hours. Not to exceed 3G     ALPRAZolam 0.25 MG tablet  Commonly known as:  XANAX  Take 0.25 mg by mouth every 8 (eight) hours as needed for anxiety.     aspirin 81 MG chewable tablet  Chew 81 mg by mouth daily.     carboxymethylcellulose 0.5 % Soln  Commonly known as:  REFRESH PLUS  Place 1 drop into both eyes 2 (two) times daily.     carvedilol 25 MG tablet  Commonly known as:  COREG  Take 25 mg by mouth 2 (two) times daily with a meal. For HTN , NOTIFY PROVIDER IF BP IS OVER 160 FOR TWO CONSECUTIVE READINGS      escitalopram 20 MG tablet  Commonly known as:  LEXAPRO  Take 20 mg by mouth daily.     ipratropium-albuterol 0.5-2.5 (3) MG/3ML Soln  Commonly known as:  DUONEB  Take 3 mLs by nebulization every 8 (eight) hours as needed. For wheezing , congestion, and dyspnea     irbesartan 300 MG tablet  Commonly known as:  AVAPRO  Take 300 mg by mouth daily. For HTN     levothyroxine 150 MCG tablet  Commonly known as:  SYNTHROID, LEVOTHROID  Take 150 mcg by mouth daily before breakfast.     ondansetron 4 MG tablet  Commonly known as:  ZOFRAN  Take 4 mg by mouth every 6 (six) hours as needed for nausea or vomiting.     traMADol 50 MG tablet  Commonly known as:  ULTRAM  Take 50 mg by mouth every 12 (twelve) hours as needed for moderate pain.     vitamin B-12 1000 MCG tablet  Commonly known as:  CYANOCOBALAMIN  Take 1,000 mcg by mouth daily.     Vitamin D3 50000 units Caps  Take 1 capsule by mouth every 30 (thirty) days. TAKES ON THE 28TH OF EVERY MONTH        No orders  of the defined types were placed in this encounter.    Immunization History  Administered Date(s) Administered  . Influenza-Unspecified 05/06/2014  . PPD Test 03/18/2014, 04/07/2014    Social History  Substance Use Topics  . Smoking status: Never Smoker   . Smokeless tobacco: Not on file  . Alcohol Use: No    Review of Systems  DATA OBTAINED: from patient, nurse GENERAL:  no fevers, fatigue, appetite changes SKIN: No itching, rash HEENT: No complaint RESPIRATORY: No cough, wheezing, SOB CARDIAC: No chest pain, palpitations, lower extremity edema  GI: No abdominal pain, No N/V/D or constipation, No heartburn or reflux  GU: No dysuria, frequency or urgency, or incontinence  MUSCULOSKELETAL: No unrelieved bone/joint pain NEUROLOGIC: No headache, dizziness  PSYCHIATRIC: No overt anxiety or sadness  Filed Vitals:   01/13/16 1155  BP: 170/69  Pulse: 62  Temp: 97.5 F (36.4 C)  Resp: 20    Physical  Exam  GENERAL APPEARANCE: Alert, mod conversant, No acute distress  SKIN: No diaphoresis rash HEENT: Unremarkable RESPIRATORY: Breathing is even, unlabored. Lung sounds are clear   CARDIOVASCULAR: Heart RRR no murmurs, rubs or gallops. No peripheral edema  GASTROINTESTINAL: Abdomen is soft, non-tender, not distended w/ normal bowel sounds.  GENITOURINARY: Bladder non tender, not distended  MUSCULOSKELETAL: No abnormal joints or musculature NEUROLOGIC: Cranial nerves 2-12 grossly intact. Moves all extremities PSYCHIATRIC: Mood and affect appropriate to situation, no behavioral issues  Patient Active Problem List   Diagnosis Date Noted  . HCAP (healthcare-associated pneumonia) 10/04/2015  . Hyperlipidemia 09/13/2015  . Pre-diabetes 08/02/2015  . Morbid obesity (HCC) 06/14/2015  . Benign essential HTN 06/14/2015  . Acute encephalopathy 06/13/2015  . Acute renal failure superimposed on stage 3 chronic kidney disease (HCC) 06/13/2015  . Dysphagia 06/13/2015  . Depression 03/18/2015  . UTI (lower urinary tract infection) 03/15/2014  . Hypothyroidism 02/23/2007  . Anemia, B12 deficiency 02/23/2007  . CKD (chronic kidney disease) stage 2, GFR 60-89 ml/min 02/23/2007    CBC    Component Value Date/Time   WBC 5.9 11/20/2015   WBC 6.6 06/15/2015 0418   RBC 3.60* 06/15/2015 0418   HGB 12.9 11/20/2015   HCT 39 11/20/2015   PLT 259 11/20/2015   MCV 103.1* 06/15/2015 0418   LYMPHSABS 0.9 03/31/2014 2127   MONOABS 1.7* 03/31/2014 2127   EOSABS 0.2 03/31/2014 2127   BASOSABS 0.0 03/31/2014 2127    CMP     Component Value Date/Time   NA 139 11/20/2015   NA 138 06/15/2015 0418   K 4.4 11/20/2015   CL 105 06/15/2015 0418   CO2 24 06/15/2015 0418   GLUCOSE 111* 06/15/2015 0418   BUN 25* 11/20/2015   BUN 21* 06/15/2015 0418   CREATININE 1.0 11/20/2015   CREATININE 0.88 06/15/2015 0418   CALCIUM 8.8* 06/15/2015 0418   PROT 6.7 06/13/2015 1636   ALBUMIN 3.5 06/13/2015 1636   AST  14 11/20/2015   ALT 8 11/20/2015   ALKPHOS 73 11/20/2015   BILITOT 0.8 06/13/2015 1636   GFRNONAA 57* 06/15/2015 0418   GFRAA >60 06/15/2015 0418    Assessment and Plan  CKD (chronic kidney disease) stage 2, GFR 60-89 ml/min Kidney fx has improved since pror; GFR 68;plan - will monitor at intervals  Hyperlipidemia Crestor non cvered by insurance and pt does not want to be on any lipid lowering agent at all; LDL off meds70, HDL-39 so it appears pt has made a good decision;plan - will monitor at intervals  Benign essential  HTN Controlled without norvasc which was stopped due to pedal edema; plan - cont other med with prn lasix as needed; if pt will wear TED hose that would be helpful    Randon Goldsmith. Lyn Hollingshead, MD

## 2016-01-17 ENCOUNTER — Encounter: Payer: Self-pay | Admitting: Internal Medicine

## 2016-02-12 ENCOUNTER — Non-Acute Institutional Stay (SKILLED_NURSING_FACILITY): Payer: Medicare Other | Admitting: Internal Medicine

## 2016-02-12 ENCOUNTER — Encounter: Payer: Self-pay | Admitting: Internal Medicine

## 2016-02-12 DIAGNOSIS — M810 Age-related osteoporosis without current pathological fracture: Secondary | ICD-10-CM

## 2016-02-12 DIAGNOSIS — N182 Chronic kidney disease, stage 2 (mild): Secondary | ICD-10-CM | POA: Diagnosis not present

## 2016-02-12 NOTE — Progress Notes (Signed)
MRN: [id number] Name: Kristine [name]  Sex: female Age: 82 y.o. DOB: January 27, 1924  PSC #:  Facility/Room: [facility] / [facility] [address] Level Of Care: SNF Provider: Randon Goldsmith. Lyn Hollingshead, MD Emergency Contacts: Extended Emergency Contact Information Primary Emergency Contact: Wilhemenia, Camba Address: [address]          [place] [address] [place] Home Phone: [phone] Work Phone: [phone] Mobile Phone: [phone] Relation: Relative Secondary Emergency Contact: Szuch,Bill Address: [address]          [place], [place] [address] [place] Home Phone: [phone] Mobile Phone: [phone] Relation: Son  Code Status: Full Code  Allergies: Amitriptyline; Buspar; Codeine; Dyazide; and Macrolides and ketolides  Chief Complaint  Patient presents with  . Medical Management of Chronic Issues    Routine Visit    HPI: Patient is 82 y.o. female who is being seen for routine issues of CKD3, obesity and osteoporosis.  Past Medical History  Diagnosis Date  . Hypertension   . Bilateral leg edema   . Hyperlipidemia     Past Surgical History  Procedure Laterality Date  . Ankle fracture surgery    . Cholecystectomy        Medication List       This list is accurate as of: 02/12/16  7:42 PM.  Always use your most recent med list.               acetaminophen 325 MG tablet  Commonly known as:  TYLENOL  Take 650 mg by mouth every 8 (eight) hours. Not to exceed 3G     ALPRAZolam 0.25 MG tablet  Commonly known as:  XANAX  Take 0.25 mg by mouth every 8 (eight) hours as needed for anxiety.     ANTISEPTIC ORAL RINSE MT  Use as directed 15 mLs in the mouth or throat 2 (two) times daily.     aspirin 81 MG chewable tablet  Chew 81 mg by mouth daily.     carboxymethylcellulose 0.5 % Soln  Commonly known as:  REFRESH PLUS  Place 1 drop into both eyes 2 (two) times daily.     carvedilol 25 MG tablet  Commonly known as:  COREG  Take 25 mg by mouth 2  (two) times daily with a meal. For HTN , NOTIFY PROVIDER IF BP IS OVER 160 FOR TWO CONSECUTIVE READINGS     escitalopram 20 MG tablet  Commonly known as:  LEXAPRO  Take 20 mg by mouth daily.     ipratropium-albuterol 0.5-2.5 (3) MG/3ML Soln  Commonly known as:  DUONEB  Take 3 mLs by nebulization every 8 (eight) hours as needed. For wheezing , congestion, and dyspnea     irbesartan 300 MG tablet  Commonly known as:  AVAPRO  Take 300 mg by mouth daily. For HTN     levothyroxine 150 MCG tablet  Commonly known as:  SYNTHROID, LEVOTHROID  Take 150 mcg by mouth daily before breakfast.     NUEDEXTA 20-10 MG Caps  Generic drug:  Dextromethorphan-Quinidine  Take 1 capsule by mouth 2 (two) times daily.     ondansetron 4 MG tablet  Commonly known as:  ZOFRAN  Take 4 mg by mouth every 6 (six) hours as needed for nausea or vomiting.     traMADol 50 MG tablet  Commonly known as:  ULTRAM  Take 50 mg by mouth every 12 (twelve) hours as needed for moderate pain.     vitamin B-12 1000 MCG tablet  Commonly known as:  CYANOCOBALAMIN  Take 1,000 mcg by mouth daily.     Vitamin D3 50000 units Caps  Take 1 capsule by mouth every 30 (thirty) days. TAKES ON THE 28TH OF EVERY MONTH        Meds ordered this encounter  Medications  . Dextromethorphan-Quinidine (NUEDEXTA) 20-10 MG CAPS    Sig: Take 1 capsule by mouth 2 (two) times daily.  . Cetylpyridinium Chloride (ANTISEPTIC ORAL RINSE MT)    Sig: Use as directed 15 mLs in the mouth or throat 2 (two) times daily.    Immunization History  Administered Date(s) Administered  . Influenza-Unspecified 05/06/2014  . PPD Test 03/18/2014, 04/07/2014    Social History  Substance Use Topics  . Smoking status: Never Smoker   . Smokeless tobacco: Not on file  . Alcohol Use: No    Review of Systems  DATA OBTAINED: from patient, nurse GENERAL:  no fevers, fatigue, appetite changes SKIN: No itching, rash HEENT: No complaint RESPIRATORY: No  cough, wheezing, SOB CARDIAC: No chest pain, palpitations, lower extremity edema  GI: No abdominal pain, No N/V/D or constipation, No heartburn or reflux  GU: No dysuria, frequency or urgency, or incontinence  MUSCULOSKELETAL: No unrelieved bone/joint pain NEUROLOGIC: No headache, dizziness  PSYCHIATRIC: No overt anxiety or sadness  Filed Vitals:   02/12/16 1104  BP: 166/80  Pulse: 58  Temp: 96.9 F (36.1 C)  Resp: 16    Physical Exam  GENERAL APPEARANCE: Alert, conversant, No acute distress  SKIN: No diaphoresis rash HEENT: Unremarkable RESPIRATORY: Breathing is even, unlabored. Lung sounds are clear   CARDIOVASCULAR: Heart RRR no murmurs, rubs or gallops. No peripheral edema  GASTROINTESTINAL: Abdomen is soft, non-tender, not distended w/ normal bowel sounds.  GENITOURINARY: Bladder non tender, not distended  MUSCULOSKELETAL: No abnormal joints or musculature NEUROLOGIC: Cranial nerves 2-12 grossly intact. Moves all extremities PSYCHIATRIC: Mood and affect appropriate to situation with dementa, no behavioral issues  Patient Active Problem List   Diagnosis Date Noted  . Osteoporosis 02/12/2016  . HCAP (healthcare-associated pneumonia) 10/04/2015  . Hyperlipidemia 09/13/2015  . Pre-diabetes 08/02/2015  . Morbid obesity (HCC) 06/14/2015  . Benign essential HTN 06/14/2015  . Acute encephalopathy 06/13/2015  . Acute renal failure superimposed on stage 3 chronic kidney disease (HCC) 06/13/2015  . Dysphagia 06/13/2015  . Depression 03/18/2015  . UTI (lower urinary tract infection) 03/15/2014  . Hypothyroidism 02/23/2007  . Anemia, B12 deficiency 02/23/2007  . CKD (chronic kidney disease) stage 2, GFR 60-89 ml/min 02/23/2007    CBC    Component Value Date/Time   WBC 5.9 11/20/2015   WBC 6.6 06/15/2015 0418   RBC 3.60* 06/15/2015 0418   HGB 12.9 11/20/2015   HCT 39 11/20/2015   PLT 259 11/20/2015   MCV 103.1* 06/15/2015 0418   LYMPHSABS 0.9 03/31/2014 2127    MONOABS 1.7* 03/31/2014 2127   EOSABS 0.2 03/31/2014 2127   BASOSABS 0.0 03/31/2014 2127    CMP     Component Value Date/Time   NA 139 11/20/2015   NA 138 06/15/2015 0418   K 4.4 11/20/2015   CL 105 06/15/2015 0418   CO2 24 06/15/2015 0418   GLUCOSE 111* 06/15/2015 0418   BUN 25* 11/20/2015   BUN 21* 06/15/2015 0418   CREATININE 1.0 11/20/2015   CREATININE 0.88 06/15/2015 0418   CALCIUM 8.8* 06/15/2015 0418   PROT 6.7 06/13/2015 1636   ALBUMIN 3.5 06/13/2015 1636   AST 14 11/20/2015   ALT 8 11/20/2015  ALKPHOS 73 11/20/2015   BILITOT 0.8 06/13/2015 1636   GFRNONAA 57* 06/15/2015 0418   GFRAA >60 06/15/2015 0418    Assessment and Plan  CKD (chronic kidney disease) stage 2, GFR 60-89 ml/min Most recent GFR represents a decline t CKD3 which pt has been before so both reresent her general baseline from 50-60; plan - cont monitor at intervals  Morbid obesity (HCC) BMI 36-36.9; stable; low calorie diet as realistically pt will not be much more active  Osteoporosis Declined DEXA 04/2015;plan - cont VitD 50,000 u monthly and tylenol and tramadol for pain; goes [facility]out facility in [facility] in good mood       Keano Guggenheim D. Lyn Hollingshead, MD

## 2016-02-12 NOTE — Assessment & Plan Note (Signed)
Declined DEXA 04/2015;plan - cont VitD 50,000 u monthly and tylenol and tramadol for pain; [facility]

## 2016-02-12 NOTE — Assessment & Plan Note (Addendum)
Most recent GFR represents a decline t CKD3 which pt has been before so both reresent her general baseline from 50-60; plan - cont monitor at intervals

## 2016-02-12 NOTE — Assessment & Plan Note (Signed)
BMI 36-36.9; stable; low calorie diet as realistically pt will not be much more active

## 2016-03-03 ENCOUNTER — Non-Acute Institutional Stay (SKILLED_NURSING_FACILITY): Payer: Medicare Other | Admitting: Internal Medicine

## 2016-03-03 ENCOUNTER — Encounter: Payer: Self-pay | Admitting: Internal Medicine

## 2016-03-03 DIAGNOSIS — D519 Vitamin B12 deficiency anemia, unspecified: Secondary | ICD-10-CM

## 2016-03-03 DIAGNOSIS — F329 Major depressive disorder, single episode, unspecified: Secondary | ICD-10-CM

## 2016-03-03 DIAGNOSIS — F32A Depression, unspecified: Secondary | ICD-10-CM

## 2016-03-03 DIAGNOSIS — I10 Essential (primary) hypertension: Secondary | ICD-10-CM

## 2016-03-03 NOTE — Progress Notes (Signed)
MRN: [id number] Name: Kristine [name]  Sex: female Age: 82 y.o. DOB: 04/04/1924  PSC #:  [facility] [address] Level Of Care: SNF Provider: Randon GoldsmithAnne D. Lyn HollingsheadAlexander, MD Emergency Contacts: Extended Emergency Contact Information Primary Emergency Contact: Renette ButtersOakley,Nancy Address: [address]          [place] [id number] [place] Home Phone: [phone] Work Phone: [phone] Mobile Phone: [phone] Relation: Relative Secondary Emergency Contact: Carthen,Bill Address: [address]          [place], [place] [address] [place] Home Phone: [phone] Mobile Phone: [phone] Relation: Son  Code Status: Full Code  Allergies: Amitriptyline; Buspar [buspirone]; Codeine; Dyazide [hydrochlorothiazide w-triamterene]; and Macrolides and ketolides  Chief Complaint  Patient presents with  . Medical Management of Chronic Issues    Routine Visit    HPI: Patient is 82 y.o. female who is being seen for routine issues of depression, Vit b12 def and HTN.  Past Medical History:  Diagnosis Date  . Bilateral leg edema   . Hyperlipidemia   . Hypertension     Past Surgical History:  Procedure Laterality Date  . ANKLE FRACTURE SURGERY    . CHOLECYSTECTOMY        Medication List       Accurate as of 03/03/16 11:59 PM. Always use your most recent med list.          acetaminophen 325 MG tablet Commonly known as:  TYLENOL Take 650 mg by mouth every 8 (eight) hours. Not to exceed 3G   ALPRAZolam 0.25 MG tablet Commonly known as:  XANAX Take 0.25 mg by mouth every 8 (eight) hours as needed for anxiety.   ANTISEPTIC ORAL RINSE MT Use as directed 15 mLs in the mouth or throat 2 (two) times daily.   aspirin 81 MG chewable tablet Chew 81 mg by mouth daily.   carboxymethylcellulose 0.5 % Soln Commonly known as:  REFRESH PLUS Place 1 drop into both eyes 2 (two) times daily.   carvedilol 25 MG tablet Commonly known as:  COREG Take 25 mg by  mouth 2 (two) times daily with a meal. For HTN , NOTIFY PROVIDER IF BP IS OVER 160 FOR TWO CONSECUTIVE READINGS   escitalopram 20 MG tablet Commonly known as:  LEXAPRO Take 20 mg by mouth daily.   ipratropium-albuterol 0.5-2.5 (3) MG/3ML Soln Commonly known as:  DUONEB Take 3 mLs by nebulization every 8 (eight) hours as needed. For wheezing , congestion, and dyspnea   irbesartan 300 MG tablet Commonly known as:  AVAPRO Take 300 mg by mouth daily. For HTN   levothyroxine 150 MCG tablet Commonly known as:  SYNTHROID, LEVOTHROID Take 150 mcg by mouth daily before breakfast.   NUEDEXTA 20-10 MG Caps Generic drug:  Dextromethorphan-Quinidine Take 1 capsule by mouth 2 (two) times daily.   ondansetron 4 MG tablet Commonly known as:  ZOFRAN Take 4 mg by mouth every 6 (six) hours as needed for nausea or vomiting.   traMADol 50 MG tablet Commonly known as:  ULTRAM Take 50 mg by mouth every 12 (twelve) hours as needed for moderate pain.   vitamin B-12 1000 MCG tablet Commonly known as:  CYANOCOBALAMIN Take 1,000 mcg by mouth daily.   Vitamin D3 50000 units Caps Take 1 capsule by mouth every 30 (thirty) days. TAKES ON THE 28TH OF EVERY MONTH       No orders of the defined types were placed in this encounter.   Immunization History  Administered Date(s) Administered  . Influenza-Unspecified 05/06/2014  . PPD Test 03/18/2014, 04/07/2014    Social History  Substance Use Topics  . Smoking status: Never Smoker  . Smokeless tobacco: Not on file  . Alcohol use No    Review of Systems  DATA OBTAINED: from patient - pt cannot fully participate; nurse GENERAL:  no fevers, fatigue, appetite changes SKIN: No itching, rash HEENT: No complaint RESPIRATORY: No cough, wheezing, SOB CARDIAC: No chest pain, palpitations, lower extremity edema  GI: No abdominal pain, No N/V/D or constipation, No heartburn or reflux  GU: No dysuria, frequency or urgency, or incontinence   MUSCULOSKELETAL: No unrelieved bone/joint pain NEUROLOGIC: No headache, dizziness  PSYCHIATRIC: No overt anxiety or sadness  Vitals:   03/03/16 0951  BP: 128/73  Pulse: 62  Resp: 18  Temp: 97.1 F (36.2 C)    Physical Exam  GENERAL APPEARANCE: Alert, conversant, No acute distress  SKIN: No diaphoresis rash HEENT: Unremarkable RESPIRATORY: Breathing is even, unlabored. Lung sounds are clear   CARDIOVASCULAR: Heart RRR no murmurs, rubs or gallops. No peripheral edema  GASTROINTESTINAL: Abdomen is soft, non-tender, not distended w/ normal bowel sounds.  GENITOURINARY: Bladder non tender, not distended  MUSCULOSKELETAL: No abnormal joints or musculature NEUROLOGIC: Cranial nerves 2-12 grossly intact. Moves all extremities PSYCHIATRIC: Mood and affect appropriate to situation with , no behavioral issues  Patient Active Problem List   Diagnosis Date Noted  . Osteoporosis 02/12/2016  . HCAP (healthcare-associated pneumonia) 10/04/2015  . Hyperlipidemia 09/13/2015  . Pre-diabetes 08/02/2015  . Morbid obesity (HCC) 06/14/2015  . Benign essential HTN 06/14/2015  . Acute encephalopathy 06/13/2015  . Acute renal failure superimposed on stage 3 chronic kidney disease (HCC) 06/13/2015  . Dysphagia 06/13/2015  . Depression 03/18/2015  . UTI (lower urinary tract infection) 03/15/2014  . Hypothyroidism 02/23/2007  . Anemia, B12 deficiency 02/23/2007  . CKD (chronic kidney disease) stage 2, GFR 60-89 ml/min 02/23/2007    CBC    Component Value Date/Time   WBC 5.9 11/20/2015   WBC 6.6 06/15/2015 0418   RBC 3.60 (L) 06/15/2015 0418   HGB 12.9 11/20/2015   HCT 39 11/20/2015   PLT 259 11/20/2015   MCV 103.1 (H) 06/15/2015 0418   LYMPHSABS 0.9 03/31/2014 2127   MONOABS 1.7 (H) 03/31/2014 2127   EOSABS 0.2 03/31/2014 2127   BASOSABS 0.0 03/31/2014 2127    CMP     Component Value Date/Time   NA 139 11/20/2015   K 4.4 11/20/2015   CL 105 06/15/2015 0418   CO2 24 06/15/2015  0418   GLUCOSE 111 (H) 06/15/2015 0418   BUN 25 (A) 11/20/2015   CREATININE 1.0 11/20/2015   CREATININE 0.88 06/15/2015 0418   CALCIUM 8.8 (L) 06/15/2015 0418   PROT 6.7 06/13/2015 1636   ALBUMIN 3.5 06/13/2015 1636   AST 14 11/20/2015   ALT 8 11/20/2015   ALKPHOS 73 11/20/2015   BILITOT 0.8 06/13/2015 1636   GFRNONAA 57 (L) 06/15/2015 0418   GFRAA >60 06/15/2015 0418    Assessment and Plan  Depression Pt always seems happy, out in hall daily; plan to continue lexapro now at 20 mg daily  Anemia, B12 deficiency Needs Vit B12 level. In meantime plan to cont B12 replcement 1000 mcg daily  Benign essential HTN Controlled on avapro 300 mg with coreg 25 mg BID. Plan to continue current meds.   Randon Goldsmith. Lyn Hollingshead, MD

## 2016-03-19 ENCOUNTER — Encounter: Payer: Self-pay | Admitting: Internal Medicine

## 2016-03-19 NOTE — Assessment & Plan Note (Signed)
Controlled on avapro 300 mg with coreg 25 mg BID. Plan to continue current meds.

## 2016-03-19 NOTE — Assessment & Plan Note (Signed)
Needs Vit B12 level. In meantime plan to cont B12 replcement 1000 mcg daily

## 2016-03-19 NOTE — Assessment & Plan Note (Signed)
Pt always seems happy, out in hall daily; plan to continue lexapro now at 20 mg daily

## 2016-03-22 LAB — CBC AND DIFFERENTIAL
HCT: 35 % — AB (ref 36–46)
HEMOGLOBIN: 12.6 g/dL (ref 12.0–16.0)
PLATELETS: 228 10*3/uL (ref 150–399)
WBC: 10.7 10*3/mL

## 2016-03-25 ENCOUNTER — Non-Acute Institutional Stay (SKILLED_NURSING_FACILITY): Payer: Medicare Other | Admitting: Internal Medicine

## 2016-03-25 ENCOUNTER — Encounter: Payer: Self-pay | Admitting: Internal Medicine

## 2016-03-25 DIAGNOSIS — J9601 Acute respiratory failure with hypoxia: Secondary | ICD-10-CM | POA: Diagnosis not present

## 2016-03-25 DIAGNOSIS — J189 Pneumonia, unspecified organism: Secondary | ICD-10-CM | POA: Diagnosis not present

## 2016-03-25 LAB — BASIC METABOLIC PANEL
BUN: 31 mg/dL — AB (ref 4–21)
CREATININE: 1.1 mg/dL (ref 0.5–1.1)
Glucose: 122 mg/dL
Potassium: 3.9 mmol/L (ref 3.4–5.3)
Sodium: 143 mmol/L (ref 137–147)

## 2016-03-25 LAB — HEPATIC FUNCTION PANEL
ALT: 13 U/L (ref 7–35)
AST: 16 U/L (ref 13–35)
Alkaline Phosphatase: 75 U/L (ref 25–125)
BILIRUBIN, TOTAL: 0.3 mg/dL

## 2016-03-25 NOTE — Progress Notes (Signed)
MRN: [id number] Name: Kristine [name]  Sex: female Age: 82 y.o. DOB: 1924/04/07  PSC #:  Facility/Room: [facility] / [facility] [address] Level Of Care: SNF Provider: Randon Goldsmith. Lyn Hollingshead, MD Emergency Contacts: Extended Emergency Contact Information Primary Emergency Contact: Eshal, Propps Address: [address]          [place] [address] [place] Home Phone: [phone] Work Phone: [phone] Mobile Phone: [phone] Relation: Relative Secondary Emergency Contact: Wilmer,Bill Address: [address]          [place], [place] [address] [place] Home Phone: [phone] Mobile Phone: [phone] Relation: Son  Code Status: Full Code  Allergies: Amitriptyline; Buspar [buspirone]; Codeine; Dyazide [hydrochlorothiazide w-triamterene]; and Macrolides and ketolides  Chief Complaint  Patient presents with  . Acute Visit    Acute    HPI: Patient is 82 y.o. female about whom [facility] midlevel consulted with me yesterday. Pt had presented with 2 days in bed, very unusual for her and RA O2 sat was 86%. Pt was placed on O2, CXR was ordered and it came back negative, no infiltrate or CHF.Pt looks sick. It was decided to start rocephin for PNA clinically. Pt is being seen today in f/u. Pt without fever, has been getting routine nebs, robitussin and O2. She can't participate in ROS.  Past Medical History:  Diagnosis Date  . Bilateral leg edema   . Hyperlipidemia   . Hypertension     Past Surgical History:  Procedure Laterality Date  . ANKLE FRACTURE SURGERY    . CHOLECYSTECTOMY        Medication List       Accurate as of 03/25/16 11:59 PM. Always use your most recent med list.          acetaminophen 325 MG tablet Commonly known as:  TYLENOL Take 650 mg by mouth every 8 (eight) hours. Not to exceed 3G   ALPRAZolam 0.25 MG tablet Commonly known as:  XANAX Take 0.25 mg by mouth every 8 (eight) hours as needed for anxiety.   ANTISEPTIC ORAL RINSE MT Use as  directed 15 mLs in the mouth or throat 2 (two) times daily.   aspirin 81 MG chewable tablet Chew 81 mg by mouth daily.   carboxymethylcellulose 0.5 % Soln Commonly known as:  REFRESH PLUS Place 1 drop into both eyes 2 (two) times daily.   carvedilol 25 MG tablet Commonly known as:  COREG Take 25 mg by mouth 2 (two) times daily with a meal. For HTN , NOTIFY PROVIDER IF BP IS OVER 160 FOR TWO CONSECUTIVE READINGS   cefTRIAXone 1 g injection Commonly known as:  ROCEPHIN Inject 1 g into the muscle daily. Stop date 04/01/16   ENSURE PO Take 237 mLs by mouth 3 (three) times daily between meals.   escitalopram 20 MG tablet Commonly known as:  LEXAPRO Take 20 mg by mouth daily.   ipratropium-albuterol 0.5-2.5 (3) MG/3ML Soln Commonly known as:  DUONEB Take 3 mLs by nebulization every 8 (eight) hours as needed. For wheezing , congestion, and dyspnea   irbesartan 300 MG tablet Commonly known as:  AVAPRO Take 300 mg by mouth daily. For HTN   levothyroxine 150 MCG tablet Commonly known as:  SYNTHROID, LEVOTHROID Take 150 mcg by mouth daily before breakfast.   NUEDEXTA 20-10 MG Caps Generic drug:  Dextromethorphan-Quinidine Take 1 capsule by mouth 2 (two) times daily.   ondansetron 4 MG tablet Commonly known as:  ZOFRAN Take 4 mg by mouth every 6 (six)  hours as needed for nausea or vomiting.   OXYGEN Inhale 2 L into the lungs continuous. To maintain 02 sat >90   traMADol 50 MG tablet Commonly known as:  ULTRAM Take 50 mg by mouth every 12 (twelve) hours as needed for moderate pain.   vitamin B-12 1000 MCG tablet Commonly known as:  CYANOCOBALAMIN Take 1,000 mcg by mouth daily.   Vitamin D3 50000 units Caps Take 1 capsule by mouth every 30 (thirty) days. TAKES ON THE 28TH OF EVERY MONTH       Meds ordered this encounter  Medications  . OXYGEN    Sig: Inhale 2 L into the lungs continuous. To maintain 02 sat >90  . cefTRIAXone (ROCEPHIN) 1 g injection    Sig: Inject 1  g into the muscle daily. Stop date 04/01/16  . Nutritional Supplements (ENSURE PO)    Sig: Take 237 mLs by mouth 3 (three) times daily between meals.    Immunization History  Administered Date(s) Administered  . Influenza-Unspecified 05/06/2014  . PPD Test 03/18/2014, 04/07/2014    Social History  Substance Use Topics  . Smoking status: Never Smoker  . Smokeless tobacco: Not on file  . Alcohol use No    Review of Systems UTO from pt, too weak  Vitals:   03/25/16 0815  BP: 128/73  Pulse: 62  Resp: 18  Temp: 97.1 F (36.2 C)    Physical Exam  GENERAL APPEARANCE: awakes to voice and smiled at me when I spoke to her. SKIN: No diaphoresis rash HEENT: Unremarkable RESPIRATORY: Breathing is even, unlabored. Lung sounds are slt rhonchi on L with good BS, decreased BS on R   CARDIOVASCULAR: Heart RRR no murmurs, rubs or gallops. No peripheral edema  GASTROINTESTINAL: Abdomen is soft, non-tender, not distended w/ normal bowel sounds.  GENITOURINARY: Bladder non tender, not distended  MUSCULOSKELETAL: No abnormal joints or musculature NEUROLOGIC: Cranial nerves 2-12 grossly intact. Moves all extremities PSYCHIATRIC: weak, some dementia, no behavioral issues  Patient Active Problem List   Diagnosis Date Noted  . Osteoporosis 02/12/2016  . HCAP (healthcare-associated pneumonia) 10/04/2015  . Hyperlipidemia 09/13/2015  . Pre-diabetes 08/02/2015  . Morbid obesity (HCC) 06/14/2015  . Benign essential HTN 06/14/2015  . Acute encephalopathy 06/13/2015  . Acute renal failure superimposed on stage 3 chronic kidney disease (HCC) 06/13/2015  . Dysphagia 06/13/2015  . Depression 03/18/2015  . UTI (lower urinary tract infection) 03/15/2014  . Hypothyroidism 02/23/2007  . Anemia, B12 deficiency 02/23/2007  . CKD (chronic kidney disease) stage 2, GFR 60-89 ml/min 02/23/2007    CBC    Component Value Date/Time   WBC 10.7 03/22/2016   WBC 6.6 06/15/2015 0418   RBC 3.60 (L)  06/15/2015 0418   HGB 12.6 03/22/2016   HCT 35 (A) 03/22/2016   PLT 228 03/22/2016   MCV 103.1 (H) 06/15/2015 0418   LYMPHSABS 0.9 03/31/2014 2127   MONOABS 1.7 (H) 03/31/2014 2127   EOSABS 0.2 03/31/2014 2127   BASOSABS 0.0 03/31/2014 2127    CMP     Component Value Date/Time   NA 139 11/20/2015   K 4.4 11/20/2015   CL 105 06/15/2015 0418   CO2 24 06/15/2015 0418   GLUCOSE 111 (H) 06/15/2015 0418   BUN 25 (A) 11/20/2015   CREATININE 1.0 11/20/2015   CREATININE 0.88 06/15/2015 0418   CALCIUM 8.8 (L) 06/15/2015 0418   PROT 6.7 06/13/2015 1636   ALBUMIN 3.5 06/13/2015 1636   AST 14 11/20/2015   ALT 8 11/20/2015  ALKPHOS 73 11/20/2015   BILITOT 0.8 06/13/2015 1636   GFRNONAA 57 (L) 06/15/2015 0418   GFRAA >60 06/15/2015 0418    Assessment and Plan  HCAP/ ACUTE HYPOXIC RESPIRATORY FAILURE - clinically pt with PNA, right sided I believe. Pt is on day 2 of rocephin. IVF were started today since pt hasn't been eating or drinking for 2 days. [facility] wrote for 1 liter NS, I extended that to 2 liters. I also extended the albuterol nebs TID to 7 days as well as the robitussin. O2 continues.Pt looks better today; will cont to monitor.   Time spent > 35 min;> 50% of time with patient was spent reviewing records, labs, tests and studies, counseling and developing plan of care  Randon Goldsmithnne D. Lyn HollingsheadAlexander, MD

## 2016-03-26 ENCOUNTER — Encounter: Payer: Self-pay | Admitting: Internal Medicine

## 2016-03-26 DIAGNOSIS — J9601 Acute respiratory failure with hypoxia: Secondary | ICD-10-CM | POA: Insufficient documentation

## 2016-03-28 ENCOUNTER — Emergency Department (HOSPITAL_COMMUNITY): Payer: Medicare Other

## 2016-03-28 ENCOUNTER — Encounter (HOSPITAL_COMMUNITY): Payer: Self-pay | Admitting: Emergency Medicine

## 2016-03-28 ENCOUNTER — Inpatient Hospital Stay (HOSPITAL_COMMUNITY)
Admission: EM | Admit: 2016-03-28 | Discharge: 2016-04-06 | DRG: 193 | Disposition: A | Discharge status: Skilled Nursing Facility | Payer: Medicare Other | Attending: Internal Medicine | Admitting: Internal Medicine

## 2016-03-28 DIAGNOSIS — J9601 Acute respiratory failure with hypoxia: Secondary | ICD-10-CM

## 2016-03-28 DIAGNOSIS — G9341 Metabolic encephalopathy: Secondary | ICD-10-CM | POA: Diagnosis present

## 2016-03-28 DIAGNOSIS — D509 Iron deficiency anemia, unspecified: Secondary | ICD-10-CM | POA: Diagnosis not present

## 2016-03-28 DIAGNOSIS — Z515 Encounter for palliative care: Secondary | ICD-10-CM | POA: Diagnosis present

## 2016-03-28 DIAGNOSIS — J189 Pneumonia, unspecified organism: Secondary | ICD-10-CM | POA: Diagnosis present

## 2016-03-28 DIAGNOSIS — I160 Hypertensive urgency: Secondary | ICD-10-CM | POA: Diagnosis present

## 2016-03-28 DIAGNOSIS — E785 Hyperlipidemia, unspecified: Secondary | ICD-10-CM | POA: Diagnosis present

## 2016-03-28 DIAGNOSIS — J90 Pleural effusion, not elsewhere classified: Secondary | ICD-10-CM | POA: Diagnosis not present

## 2016-03-28 DIAGNOSIS — I517 Cardiomegaly: Secondary | ICD-10-CM | POA: Diagnosis present

## 2016-03-28 DIAGNOSIS — E039 Hypothyroidism, unspecified: Secondary | ICD-10-CM | POA: Diagnosis present

## 2016-03-28 DIAGNOSIS — Y95 Nosocomial condition: Secondary | ICD-10-CM | POA: Diagnosis present

## 2016-03-28 DIAGNOSIS — F329 Major depressive disorder, single episode, unspecified: Secondary | ICD-10-CM | POA: Diagnosis present

## 2016-03-28 DIAGNOSIS — R0600 Dyspnea, unspecified: Secondary | ICD-10-CM | POA: Diagnosis not present

## 2016-03-28 DIAGNOSIS — R001 Bradycardia, unspecified: Secondary | ICD-10-CM | POA: Diagnosis present

## 2016-03-28 DIAGNOSIS — Z7189 Other specified counseling: Secondary | ICD-10-CM

## 2016-03-28 DIAGNOSIS — I129 Hypertensive chronic kidney disease with stage 1 through stage 4 chronic kidney disease, or unspecified chronic kidney disease: Secondary | ICD-10-CM | POA: Diagnosis present

## 2016-03-28 DIAGNOSIS — Z885 Allergy status to narcotic agent status: Secondary | ICD-10-CM

## 2016-03-28 DIAGNOSIS — D72829 Elevated white blood cell count, unspecified: Secondary | ICD-10-CM | POA: Diagnosis not present

## 2016-03-28 DIAGNOSIS — E860 Dehydration: Secondary | ICD-10-CM | POA: Diagnosis present

## 2016-03-28 DIAGNOSIS — Z7982 Long term (current) use of aspirin: Secondary | ICD-10-CM

## 2016-03-28 DIAGNOSIS — Z888 Allergy status to other drugs, medicaments and biological substances status: Secondary | ICD-10-CM | POA: Diagnosis not present

## 2016-03-28 DIAGNOSIS — R7303 Prediabetes: Secondary | ICD-10-CM | POA: Diagnosis present

## 2016-03-28 DIAGNOSIS — N183 Chronic kidney disease, stage 3 (moderate): Secondary | ICD-10-CM | POA: Diagnosis present

## 2016-03-28 DIAGNOSIS — Z789 Other specified health status: Secondary | ICD-10-CM | POA: Diagnosis not present

## 2016-03-28 DIAGNOSIS — Z23 Encounter for immunization: Secondary | ICD-10-CM

## 2016-03-28 DIAGNOSIS — I472 Ventricular tachycardia: Secondary | ICD-10-CM | POA: Diagnosis present

## 2016-03-28 DIAGNOSIS — R05 Cough: Secondary | ICD-10-CM | POA: Diagnosis present

## 2016-03-28 DIAGNOSIS — Z79899 Other long term (current) drug therapy: Secondary | ICD-10-CM | POA: Diagnosis not present

## 2016-03-28 DIAGNOSIS — E871 Hypo-osmolality and hyponatremia: Secondary | ICD-10-CM | POA: Diagnosis present

## 2016-03-28 LAB — MRSA PCR SCREENING: MRSA by PCR: NEGATIVE

## 2016-03-28 LAB — HEPATIC FUNCTION PANEL
ALT: 25 U/L (ref 14–54)
AST: 25 U/L (ref 15–41)
Albumin: 2.9 g/dL — ABNORMAL LOW (ref 3.5–5.0)
Alkaline Phosphatase: 87 U/L (ref 38–126)
BILIRUBIN DIRECT: 0.1 mg/dL (ref 0.1–0.5)
BILIRUBIN INDIRECT: 0.6 mg/dL (ref 0.3–0.9)
Total Bilirubin: 0.7 mg/dL (ref 0.3–1.2)
Total Protein: 6.9 g/dL (ref 6.5–8.1)

## 2016-03-28 LAB — CBC WITH DIFFERENTIAL/PLATELET
Basophils Absolute: 0.1 10*3/uL (ref 0.0–0.1)
Basophils Relative: 1 %
EOS PCT: 3 %
Eosinophils Absolute: 0.3 10*3/uL (ref 0.0–0.7)
HCT: 38.2 % (ref 36.0–46.0)
Hemoglobin: 12.5 g/dL (ref 12.0–15.0)
LYMPHS ABS: 0.7 10*3/uL (ref 0.7–4.0)
LYMPHS PCT: 5 %
MCH: 34.5 pg — AB (ref 26.0–34.0)
MCHC: 32.7 g/dL (ref 30.0–36.0)
MCV: 105.5 fL — AB (ref 78.0–100.0)
MONO ABS: 1.8 10*3/uL — AB (ref 0.1–1.0)
MONOS PCT: 15 %
Neutro Abs: 9.6 10*3/uL — ABNORMAL HIGH (ref 1.7–7.7)
Neutrophils Relative %: 76 %
PLATELETS: 254 10*3/uL (ref 150–400)
RBC: 3.62 MIL/uL — AB (ref 3.87–5.11)
RDW: 13.5 % (ref 11.5–15.5)
WBC: 12.5 10*3/uL — ABNORMAL HIGH (ref 4.0–10.5)

## 2016-03-28 LAB — I-STAT TROPONIN, ED: TROPONIN I, POC: 0.04 ng/mL (ref 0.00–0.08)

## 2016-03-28 LAB — BASIC METABOLIC PANEL
Anion gap: 8 (ref 5–15)
BUN: 28 mg/dL — ABNORMAL HIGH (ref 6–20)
CALCIUM: 9.6 mg/dL (ref 8.9–10.3)
CO2: 29 mmol/L (ref 22–32)
CREATININE: 1 mg/dL (ref 0.44–1.00)
Chloride: 105 mmol/L (ref 101–111)
GFR calc Af Amer: 56 mL/min — ABNORMAL LOW (ref >60)
GFR, EST NON AFRICAN AMERICAN: 48 mL/min — AB (ref >60)
GLUCOSE: 93 mg/dL (ref 65–99)
Potassium: 4.1 mmol/L (ref 3.5–5.1)
SODIUM: 142 mmol/L (ref 135–145)

## 2016-03-28 LAB — BRAIN NATRIURETIC PEPTIDE: B Natriuretic Peptide: 515.5 pg/mL — ABNORMAL HIGH (ref 0.0–100.0)

## 2016-03-28 LAB — I-STAT CG4 LACTIC ACID, ED: LACTIC ACID, VENOUS: 0.86 mmol/L (ref 0.5–1.9)

## 2016-03-28 MED ORDER — CARBOXYMETHYLCELLULOSE SODIUM 0.5 % OP SOLN: 1.0000 [drp] | Freq: Two times a day (BID) | OPHTHALMIC | Status: DC

## 2016-03-28 MED ORDER — ENOXAPARIN SODIUM 40 MG/0.4ML ~~LOC~~ SOLN
40.0000 mg | SUBCUTANEOUS | Status: DC
  Administered 2016-03-28 – 2016-04-05 (×9): 40 mg via SUBCUTANEOUS
  Filled 2016-03-28 (×8): qty 0.4

## 2016-03-28 MED ORDER — LABETALOL HCL 5 MG/ML IV SOLN
10.0000 mg | INTRAVENOUS | Status: DC | PRN
  Administered 2016-03-28 – 2016-03-29 (×2): 10 mg via INTRAVENOUS
  Filled 2016-03-28 (×3): qty 4

## 2016-03-28 MED ORDER — LEVALBUTEROL HCL 0.63 MG/3ML IN NEBU
0.6300 mg | INHALATION_SOLUTION | Freq: Three times a day (TID) | RESPIRATORY_TRACT | Status: DC
  Administered 2016-03-29 – 2016-04-02 (×13): 0.63 mg via RESPIRATORY_TRACT
  Filled 2016-03-28 (×14): qty 3

## 2016-03-28 MED ORDER — DEXTROMETHORPHAN-QUINIDINE 20-10 MG PO CAPS
1.0000 | ORAL_CAPSULE | Freq: Two times a day (BID) | ORAL | Status: DC
  Administered 2016-03-28 – 2016-04-06 (×17): 1 via ORAL
  Filled 2016-03-28 (×19): qty 1

## 2016-03-28 MED ORDER — DEXTROSE 5 % IV SOLN
1.0000 g | Freq: Three times a day (TID) | INTRAVENOUS | Status: DC
  Filled 2016-03-28 (×2): qty 1

## 2016-03-28 MED ORDER — GUAIFENESIN-DM 100-10 MG/5ML PO SYRP
5.0000 mL | ORAL_SOLUTION | ORAL | Status: DC | PRN
  Administered 2016-03-31: 5 mL via ORAL
  Filled 2016-03-28: qty 10

## 2016-03-28 MED ORDER — DEXTROSE 5 % IV SOLN
1.0000 g | Freq: Once | INTRAVENOUS | Status: AC
  Administered 2016-03-28: 1 g via INTRAVENOUS
  Filled 2016-03-28: qty 1

## 2016-03-28 MED ORDER — ONDANSETRON HCL 4 MG PO TABS: 4.0000 mg | ORAL_TABLET | Freq: Four times a day (QID) | ORAL | Status: DC | PRN

## 2016-03-28 MED ORDER — LEVALBUTEROL HCL 0.63 MG/3ML IN NEBU
0.6300 mg | INHALATION_SOLUTION | Freq: Four times a day (QID) | RESPIRATORY_TRACT | Status: DC
  Administered 2016-03-28: 0.63 mg via RESPIRATORY_TRACT
  Filled 2016-03-28: qty 3

## 2016-03-28 MED ORDER — ASPIRIN 81 MG PO CHEW
81.0000 mg | CHEWABLE_TABLET | Freq: Every day | ORAL | Status: DC
  Administered 2016-03-28 – 2016-04-06 (×10): 81 mg via ORAL
  Filled 2016-03-28 (×10): qty 1

## 2016-03-28 MED ORDER — LEVALBUTEROL HCL 0.63 MG/3ML IN NEBU: 0.6300 mg | INHALATION_SOLUTION | Freq: Four times a day (QID) | RESPIRATORY_TRACT | Status: DC | PRN

## 2016-03-28 MED ORDER — LABETALOL HCL 5 MG/ML IV SOLN
10.0000 mg | Freq: Once | INTRAVENOUS | Status: AC
  Administered 2016-03-28: 10 mg via INTRAVENOUS
  Filled 2016-03-28: qty 4

## 2016-03-28 MED ORDER — IRBESARTAN 300 MG PO TABS
300.0000 mg | ORAL_TABLET | Freq: Every day | ORAL | Status: DC
  Administered 2016-03-29 – 2016-04-06 (×9): 300 mg via ORAL
  Filled 2016-03-28 (×9): qty 1

## 2016-03-28 MED ORDER — ORAL CARE MOUTH RINSE
15.0000 mL | Freq: Two times a day (BID) | OROMUCOSAL | Status: DC
  Administered 2016-03-28 – 2016-04-06 (×15): 15 mL via OROMUCOSAL

## 2016-03-28 MED ORDER — LEVOTHYROXINE SODIUM 75 MCG PO TABS
150.0000 ug | ORAL_TABLET | Freq: Every day | ORAL | Status: DC
  Administered 2016-03-29 – 2016-04-06 (×9): 150 ug via ORAL
  Filled 2016-03-28 (×3): qty 2
  Filled 2016-03-28: qty 1
  Filled 2016-03-28 (×5): qty 2
  Filled 2016-03-28 (×3): qty 1
  Filled 2016-03-28: qty 2
  Filled 2016-03-28: qty 1
  Filled 2016-03-28: qty 2
  Filled 2016-03-28: qty 1

## 2016-03-28 MED ORDER — GUAIFENESIN ER 600 MG PO TB12
600.0000 mg | ORAL_TABLET | Freq: Two times a day (BID) | ORAL | Status: DC
  Administered 2016-03-28 – 2016-04-06 (×17): 600 mg via ORAL
  Filled 2016-03-28 (×18): qty 1

## 2016-03-28 MED ORDER — VITAMIN B-12 1000 MCG PO TABS
1000.0000 ug | ORAL_TABLET | Freq: Every day | ORAL | Status: DC
  Administered 2016-03-29 – 2016-04-06 (×9): 1000 ug via ORAL
  Filled 2016-03-28 (×9): qty 1

## 2016-03-28 MED ORDER — DEXTROSE 5 % IV SOLN: 1.0000 g | INTRAVENOUS | Status: DC

## 2016-03-28 MED ORDER — CARVEDILOL 25 MG PO TABS
25.0000 mg | ORAL_TABLET | Freq: Two times a day (BID) | ORAL | Status: DC
  Administered 2016-03-28 – 2016-03-29 (×2): 25 mg via ORAL
  Filled 2016-03-28: qty 2
  Filled 2016-03-28 (×2): qty 1

## 2016-03-28 MED ORDER — POLYVINYL ALCOHOL 1.4 % OP SOLN
1.0000 [drp] | Freq: Two times a day (BID) | OPHTHALMIC | Status: DC
  Administered 2016-03-28 – 2016-04-06 (×18): 1 [drp] via OPHTHALMIC
  Filled 2016-03-28: qty 15

## 2016-03-28 MED ORDER — VANCOMYCIN HCL 10 G IV SOLR
1500.0000 mg | INTRAVENOUS | Status: AC
  Administered 2016-03-28: 1500 mg via INTRAVENOUS
  Filled 2016-03-28: qty 1500

## 2016-03-28 MED ORDER — VANCOMYCIN HCL 10 G IV SOLR: 1250.0000 mg | INTRAVENOUS | Status: DC

## 2016-03-28 MED ORDER — ESCITALOPRAM OXALATE 10 MG PO TABS
20.0000 mg | ORAL_TABLET | Freq: Every day | ORAL | Status: DC
  Administered 2016-03-28 – 2016-04-06 (×10): 20 mg via ORAL
  Filled 2016-03-28 (×10): qty 2

## 2016-03-28 MED ORDER — SODIUM CHLORIDE 0.9 % IV SOLN: Freq: Once | INTRAVENOUS | Status: DC

## 2016-03-28 MED ORDER — ACETAMINOPHEN 325 MG PO TABS
650.0000 mg | ORAL_TABLET | Freq: Three times a day (TID) | ORAL | Status: DC
  Administered 2016-03-28 – 2016-04-06 (×26): 650 mg via ORAL
  Filled 2016-03-28 (×27): qty 2

## 2016-03-28 MED ORDER — ONDANSETRON HCL 4 MG/2ML IJ SOLN: 4.0000 mg | Freq: Four times a day (QID) | INTRAMUSCULAR | Status: DC | PRN

## 2016-03-28 MED ORDER — SODIUM CHLORIDE 0.9 % IV SOLN
INTRAVENOUS | Status: DC
  Administered 2016-03-28 – 2016-03-30 (×3): via INTRAVENOUS

## 2016-03-28 MED ORDER — ALPRAZOLAM 0.25 MG PO TABS: 0.2500 mg | ORAL_TABLET | Freq: Three times a day (TID) | ORAL | Status: DC | PRN

## 2016-03-28 MED ORDER — TRAMADOL HCL 50 MG PO TABS
50.0000 mg | ORAL_TABLET | Freq: Two times a day (BID) | ORAL | Status: DC | PRN
  Administered 2016-03-30 – 2016-04-05 (×6): 50 mg via ORAL
  Filled 2016-03-28 (×6): qty 1

## 2016-03-28 NOTE — ED Triage Notes (Signed)
Pt had breathing Tx (Duoneb) with Robitussin about 1200 today PTA

## 2016-03-28 NOTE — H&P (Addendum)
History and Physical    Kristine [name] ZOX:[id number] DOB: 1924/01/25 DOA: 03/28/2016  Referring MD/NP/PA: Tresa Endo PA  PCP: Margit Hanks, MD   Patient coming from: [facility]  Chief Complaint: cough   HPI: Kristine [name] is a 82 y.o. female with HTN, depression, who presented from [facility] with main concern of one week duration of progressively  Worsening cough and dyspnea, initially present with exertion and has progressed to dyspnea at rest over the past 48 hours, Cough has been mixed, non productive and occasionally production of clear sputum, pt does report difficulty bringing sputum up, saying she feels too week to cough anything up. Pt also reports subjective fevers, chills, malaise, poor oral intake. She has been having chest pain associated with coughing spells and has taken several medications for cough with no relief. No abd or urinary concerns. Pt also denies any specific focal neurological symptoms such as unilateral weakness, no problems with sensation, no visual concerns.   ED Course: Pt hemodynamically stable but noted to be coughing and with BP as high as 233/91. It was noted that if not coughing, BP down to 140's/90 but as soon as pt starts coughing, blood pressure goes up. Imaging studies notable for right sided PNA. Pt started on Vanc and Maxipime. TRH asked to admit for further evaluation.   Review of Systems:  Constitutional: Negative for activity change HENT: Negative for ear pain, nosebleeds, neck stiffness and ear discharge.   Eyes: Negative for pain, discharge, redness, itching and visual disturbance.  Respiratory: Negative for hematemesis, choking, and stridor.   Cardiovascular: Negative for palpitations and leg swelling.  Gastrointestinal: Negative for abdominal distention.  Genitourinary: Negative for dysuria, urgency, frequency, hematuria, flank pain, decreased urine volume, difficulty urinating and dyspareunia.  Musculoskeletal: Negative for back  pain, joint swelling, arthralgias and gait problem.  Neurological: Negative for dizziness, tremors, seizures, syncope, facial asymmetry Hematological: Negative for adenopathy. Does not bruise/bleed easily.  Psychiatric/Behavioral: Negative for hallucinations, behavioral problems, confusion, dysphoric mood  Past Medical History:  Diagnosis Date  . Bilateral leg edema   . Hyperlipidemia   . Hypertension     Past Surgical History:  Procedure Laterality Date  . ANKLE FRACTURE SURGERY    . CHOLECYSTECTOMY     Social Hx:  reports that she has never smoked. She has never used smokeless tobacco. She reports that she does not drink alcohol or use drugs.  Allergies  Allergen Reactions  . Amitriptyline     Weak sluggish  . Buspar [Buspirone]     Increased anxiety   . Codeine     Lethargic gitter   . Dyazide [Hydrochlorothiazide W-Triamterene]     weakness  . Macrolides And Ketolides     Rash     NO family hx of HTN or cancers.   Medication Sig  ALPRAZolam (XANAX) 0.25 MG tablet Take 0.25 mg by mouth every 8 (eight) hours as needed for anxiety.  aspirin 81 MG chewable tablet Chew 81 mg by mouth daily.  carboxymethylcellulose (REFRESH PLUS) 0.5 % SOLN Place 1 drop into both eyes 2 (two) times daily.   carvedilol (COREG) 25 MG tablet Take 25 mg by mouth 2 (two) times daily with a meal. For HTN , NOTIFY PROVIDER IF BP IS OVER 160 FOR TWO CONSECUTIVE READINGS  Dextromethorphan-Quinidine (NUEDEXTA) 20-10 MG CAPS Take 1 capsule by mouth 2 (two) times daily.  escitalopram (LEXAPRO) 20 MG tablet Take 20 mg by mouth daily.  ipratropium-albuterol (DUONEB) 0.5-2.5 (3)  MG/3ML SOLN Take 3 mLs by nebulization every 8 (eight) hours as needed. For wheezing , congestion, and dyspnea  irbesartan (AVAPRO) 300 MG tablet Take 300 mg by mouth daily. For HTN  levothyroxine (SYNTHROID, LEVOTHROID) 150 MCG tablet Take 150 mcg by mouth daily before breakfast.  traMADol (ULTRAM) 50 MG tablet Take 50 mg by  mouth every 12 (twelve) hours as needed for moderate pain.  vitamin B-12 (CYANOCOBALAMIN) 1000 MCG tablet Take 1,000 mcg by mouth daily.    Physical Exam: Vitals:   03/28/16 1703 03/28/16 1708 03/28/16 1723 03/28/16 1756  BP: (!) 214/75 (!) 210/64  195/64  Pulse: 70     Resp: 15   17  Temp:   99.6 F (37.6 C)   TempSrc:   Rectal   SpO2: 97%     Weight:      Height:        Constitutional: NAD, calm, comfortable Vitals:   03/28/16 1703 03/28/16 1708 03/28/16 1723 03/28/16 1756  BP: (!) 214/75 (!) 210/64  195/64  Pulse: 70     Resp: 15   17  Temp:   99.6 F (37.6 C)   TempSrc:   Rectal   SpO2: 97%     Weight:      Height:       Eyes: PERRL, lids and conjunctivae normal ENMT: Mucous membranes dry. Posterior pharynx clear of any exudate or lesions.Normal dentition.  Neck: normal, supple, no masses, no thyromegaly Respiratory: tachypnea with RR in mid 20's, rhonchi at bases with mild expiratory wheezing  Cardiovascular: Regular rate and rhythm, No extremity edema. 2+ pedal pulses. No carotid bruits.  Abdomen: no tenderness, no masses palpated. No hepatosplenomegaly. Bowel sounds positive.  Musculoskeletal: no clubbing / cyanosis. No joint deformity upper and lower extremities. Good ROM, no contractures. Normal muscle tone.  Skin: no rashes, lesions, ulcers. No induration Neurologic: CN 2-12 grossly intact. Strength 5/5 in all 4.  Psychiatric: Normal judgment and insight.   Labs on Admission: I have personally reviewed following labs and imaging studies  CBC:  Recent Labs Lab 03/22/16 03/28/16 1546  WBC 10.7 12.5*  NEUTROABS  --  9.6*  HGB 12.6 12.5  HCT 35* 38.2  MCV  --  105.5*  PLT 228 254   Basic Metabolic Panel:  Recent Labs Lab 03/28/16 1546  NA 142  K 4.1  CL 105  CO2 29  GLUCOSE 93  BUN 28*  CREATININE 1.00  CALCIUM 9.6   GFR: Estimated Creatinine Clearance: 44.4 mL/min (by C-G formula based on SCr of 1 mg/dL). Liver Function Tests:  Recent  Labs Lab 03/28/16 1545  AST 25  ALT 25  ALKPHOS 87  BILITOT 0.7  PROT 6.9  ALBUMIN 2.9*   Urine analysis:    Component Value Date/Time   COLORURINE YELLOW 06/13/2015 1800   APPEARANCEUR CLOUDY (A) 06/13/2015 1800   LABSPEC 1.013 06/13/2015 1800   PHURINE 7.0 06/13/2015 1800   GLUCOSEU NEGATIVE 06/13/2015 1800   HGBUR TRACE (A) 06/13/2015 1800   BILIRUBINUR NEGATIVE 06/13/2015 1800   KETONESUR NEGATIVE 06/13/2015 1800   PROTEINUR NEGATIVE 06/13/2015 1800   UROBILINOGEN 1.0 04/01/2014 0203   NITRITE POSITIVE (A) 06/13/2015 1800   LEUKOCYTESUR NEGATIVE 06/13/2015 1800   Radiological Exams on Admission: Dg Chest 2 View  Result Date: 03/28/2016 CLINICAL DATA:  One week history of cough. EXAM: CHEST  2 VIEW COMPARISON:  Chest x-ray 06/13/2015 FINDINGS: The heart is borderline enlarged but stable. Stable tortuosity and calcification of the thoracic aorta.  Chronic underlying bronchitic type changes. There is also peribronchial thickening, increased interstitial markings and patchy right lung infiltrates. Small bilateral pleural effusions are also noted. The bony thorax is intact. IMPRESSION: Probable bronchitis and right upper lobe and right lower lobe bronchopneumonia. Small bilateral pleural effusions. Electronically Signed   By: Rudie MeyerP.  Gallerani M.D.   On: 03/28/2016 16:35    EKG: pending   Assessment/Plan Active Problems:   HCAP (healthcare-associated pneumonia) - RLL and RUL PNA, also concern for aspiration PNA - agree with vanc and maxipime - admit to SDU - place on BD's scheduled and as needed - also place on antitussives - follow up on sputum and blood cultures     Hypertensive urgency - continue home medical regimen Coreg and Avapro  - also place on Labetalol IVF as needed for SBP > 170   - if uncontrolled BP, may need to be placed on drip     Hypothyroidism  - continue synthroid per home medical regimen   DVT prophylaxis: lovenox SQ Code Status: Full  Family  Communication: Pt updated at bedside, daughter also at bedside  Disposition Plan: Back to [facility] when stable  Consults called: None Admission status: Inpatient, SDU   Debbora PrestoMAGICK-Tallis Soledad MD [facility] Pager [phone]- [phone]  If 7PM-7AM, please contact night-coverage [email or web address] Password [email or web address]  03/28/2016, 6:15 PM

## 2016-03-28 NOTE — ED Provider Notes (Signed)
[facility]-EMERGENCY DEPT Provider Note   CSN: [id number] Arrival date & time: 03/28/16  1425     History   Chief Complaint Chief Complaint  Patient presents with  . Cough    HPI Kristine [name] is a 82 y.o. female who presents with cough for one week. PMH significant for hx of HCAP, pre-diabetes, HLD, HTN, CKD, hypothyroidism, B12 deficiency, depression. Daughter at bedside helps provide history. She comes from a SNF and is not ambulatory due to lower leg edema. She states the patient has had decreased PO intake, dehydration, and progressive worsening fatigue over the past week. Cough is sometimes dry sometimes productive. No hypoxia here in the ED however on review of EMR she had hypoxia to 86% 3 days ago. She was given Rocephin, routine nebs, and placed on 3L O2. She was given IVF and a CXR was done at [facility]. Unsure if she has been able to tolerate her medicines. Denies fever, chills, URI symptoms, chest pain, worsening lower leg edema (has chronic edema and is bed/wheelchair bound), abdominal pain, N/V/D. She is not a current smoker and denies hx of lung or cardiac disease.  HPI  Past Medical History:  Diagnosis Date  . Bilateral leg edema   . Hyperlipidemia   . Hypertension     Patient Active Problem List   Diagnosis Date Noted  . Acute respiratory failure with hypoxia (HCC) 03/26/2016  . Osteoporosis 02/12/2016  . HCAP (healthcare-associated pneumonia) 10/04/2015  . Hyperlipidemia 09/13/2015  . Pre-diabetes 08/02/2015  . Morbid obesity (HCC) 06/14/2015  . Benign essential HTN 06/14/2015  . Acute encephalopathy 06/13/2015  . Acute renal failure superimposed on stage 3 chronic kidney disease (HCC) 06/13/2015  . Dysphagia 06/13/2015  . Depression 03/18/2015  . UTI (lower urinary tract infection) 03/15/2014  . Hypothyroidism 02/23/2007  . Anemia, B12 deficiency 02/23/2007  . CKD (chronic kidney disease) stage 2, GFR 60-89 ml/min 02/23/2007    Past Surgical History:  Procedure  Laterality Date  . ANKLE FRACTURE SURGERY    . CHOLECYSTECTOMY      OB History    No data available       Home Medications    Prior to Admission medications   Medication Sig Start Date End Date Taking? Authorizing Provider  acetaminophen (TYLENOL) 325 MG tablet Take 650 mg by mouth every 8 (eight) hours. Not to exceed 3G    Historical Provider, MD  ALPRAZolam Prudy Feeler) 0.25 MG tablet Take 0.25 mg by mouth every 8 (eight) hours as needed for anxiety.    Historical Provider, MD  aspirin 81 MG chewable tablet Chew 81 mg by mouth daily.    Historical Provider, MD  carboxymethylcellulose (REFRESH PLUS) 0.5 % SOLN Place 1 drop into both eyes 2 (two) times daily.     Historical Provider, MD  carvedilol (COREG) 25 MG tablet Take 25 mg by mouth 2 (two) times daily with a meal. For HTN , NOTIFY PROVIDER IF BP IS OVER 160 FOR TWO CONSECUTIVE READINGS    Historical Provider, MD  cefTRIAXone (ROCEPHIN) 1 g injection Inject 1 g into the muscle daily. Stop date 04/01/16 02/22/16 04/01/16  Historical Provider, MD  Cetylpyridinium Chloride (ANTISEPTIC ORAL RINSE MT) Use as directed 15 mLs in the mouth or throat 2 (two) times daily.    Historical Provider, MD  Cholecalciferol (VITAMIN D3) 50000 UNITS CAPS Take 1 capsule by mouth every 30 (thirty) days. TAKES ON THE 28TH OF EVERY MONTH    Historical Provider, MD  Dextromethorphan-Quinidine (NUEDEXTA) 20-10 MG  CAPS Take 1 capsule by mouth 2 (two) times daily.    Historical Provider, MD  escitalopram (LEXAPRO) 20 MG tablet Take 20 mg by mouth daily.    Historical Provider, MD  ipratropium-albuterol (DUONEB) 0.5-2.5 (3) MG/3ML SOLN Take 3 mLs by nebulization every 8 (eight) hours as needed. For wheezing , congestion, and dyspnea    Historical Provider, MD  irbesartan (AVAPRO) 300 MG tablet Take 300 mg by mouth daily. For HTN    Historical Provider, MD  levothyroxine (SYNTHROID, LEVOTHROID) 150 MCG tablet Take 150 mcg by mouth daily before breakfast.    Historical  Provider, MD  Nutritional Supplements (ENSURE PO) Take 237 mLs by mouth 3 (three) times daily between meals.    Historical Provider, MD  ondansetron (ZOFRAN) 4 MG tablet Take 4 mg by mouth every 6 (six) hours as needed for nausea or vomiting.    Historical Provider, MD  OXYGEN Inhale 2 L into the lungs continuous. To maintain 02 sat >90    Historical Provider, MD  traMADol (ULTRAM) 50 MG tablet Take 50 mg by mouth every 12 (twelve) hours as needed for moderate pain.    Historical Provider, MD  vitamin B-12 (CYANOCOBALAMIN) 1000 MCG tablet Take 1,000 mcg by mouth daily.    Historical Provider, MD    Family History Family History  Problem Relation Age of Onset  . Family history unknown: Yes    Social History Social History  Substance Use Topics  . Smoking status: Never Smoker  . Smokeless tobacco: Never Used  . Alcohol use No     Allergies   Amitriptyline; Buspar [buspirone]; Codeine; Dyazide [hydrochlorothiazide w-triamterene]; and Macrolides and ketolides   Review of Systems Review of Systems  Constitutional: Positive for appetite change and fatigue. Negative for chills and fever.  Respiratory: Positive for cough and shortness of breath.   Cardiovascular: Negative for chest pain.  Gastrointestinal: Negative for abdominal pain, diarrhea, nausea and vomiting.     Physical Exam Updated Vital Signs BP 188/67   Pulse 66   Temp 99.6 F (37.6 C) (Rectal)   Resp 13   Ht 5\' 6"  (1.676 m)   Wt 99.3 kg   SpO2 96%   BMI 35.35 kg/m   Physical Exam  Constitutional: She is oriented to person, place, and time. She appears well-developed and well-nourished. She appears lethargic. She appears ill. No distress.  Elderly female, appears fatigued and uncomfortable  HENT:  Head: Normocephalic and atraumatic.  Eyes: Conjunctivae are normal. Pupils are equal, round, and reactive to light. Right eye exhibits no discharge. Left eye exhibits no discharge. No scleral icterus.  Neck: Normal  range of motion. Neck supple.  Cardiovascular: Normal rate and regular rhythm.  Exam reveals no gallop and no friction rub.   No murmur heard. Pulmonary/Chest: Effort normal and breath sounds normal. No respiratory distress. She has no wheezes. She has no rales. She exhibits no tenderness.  Abdominal: Soft. Bowel sounds are normal. She exhibits no distension and no mass. There is tenderness. There is no rebound and no guarding. No hernia.  Mild generalized tenderness  Musculoskeletal: She exhibits edema.  Bilateral lower leg edema with foot drop. Daughter states swelling has decreased from baseline  Neurological: She is oriented to person, place, and time. She appears lethargic.  Skin: Skin is warm and dry.  Psychiatric: She has a normal mood and affect. Her behavior is normal.  Nursing note and vitals reviewed.    ED Treatments / Results  Labs (all labs ordered  are listed, but only abnormal results are displayed) Labs Reviewed  BASIC METABOLIC PANEL - Abnormal; Notable for the following:       Result Value   BUN 28 (*)    GFR calc non Af Amer 48 (*)    GFR calc Af Amer 56 (*)    All other components within normal limits  BRAIN NATRIURETIC PEPTIDE - Abnormal; Notable for the following:    B Natriuretic Peptide 515.5 (*)    All other components within normal limits  CBC WITH DIFFERENTIAL/PLATELET - Abnormal; Notable for the following:    WBC 12.5 (*)    RBC 3.62 (*)    MCV 105.5 (*)    MCH 34.5 (*)    Neutro Abs 9.6 (*)    Monocytes Absolute 1.8 (*)    All other components within normal limits  HEPATIC FUNCTION PANEL - Abnormal; Notable for the following:    Albumin 2.9 (*)    All other components within normal limits  I-STAT TROPOININ, ED  I-STAT CG4 LACTIC ACID, ED  I-STAT CG4 LACTIC ACID, ED    EKG  EKG Interpretation None       Radiology Dg Chest 2 View  Result Date: 03/28/2016 CLINICAL DATA:  One week history of cough. EXAM: CHEST  2 VIEW COMPARISON:  Chest  x-ray 06/13/2015 FINDINGS: The heart is borderline enlarged but stable. Stable tortuosity and calcification of the thoracic aorta. Chronic underlying bronchitic type changes. There is also peribronchial thickening, increased interstitial markings and patchy right lung infiltrates. Small bilateral pleural effusions are also noted. The bony thorax is intact. IMPRESSION: Probable bronchitis and right upper lobe and right lower lobe bronchopneumonia. Small bilateral pleural effusions. Electronically Signed   By: Rudie Meyer M.D.   On: 03/28/2016 16:35    Procedures Procedures (including critical care time)  Medications Ordered in ED Medications  vancomycin (VANCOCIN) 1,500 mg in sodium chloride 0.9 % 500 mL IVPB (1,500 mg Intravenous New Bag/Given 03/28/16 1746)  vancomycin (VANCOCIN) 1,250 mg in sodium chloride 0.9 % 250 mL IVPB (not administered)  acetaminophen (TYLENOL) tablet 650 mg (not administered)  ALPRAZolam (XANAX) tablet 0.25 mg (not administered)  aspirin chewable tablet 81 mg (not administered)  carboxymethylcellulose (REFRESH PLUS) 0.5 % ophthalmic solution 1 drop (not administered)  carvedilol (COREG) tablet 25 mg (not administered)  Dextromethorphan-Quinidine 20-10 MG CAPS 1 capsule (not administered)  escitalopram (LEXAPRO) tablet 20 mg (not administered)  irbesartan (AVAPRO) tablet 300 mg (not administered)  levothyroxine (SYNTHROID, LEVOTHROID) tablet 150 mcg (not administered)  traMADol (ULTRAM) tablet 50 mg (not administered)  vitamin B-12 (CYANOCOBALAMIN) tablet 1,000 mcg (not administered)  labetalol (NORMODYNE,TRANDATE) injection 10 mg (not administered)  ceFEPIme (MAXIPIME) 1 g in dextrose 5 % 50 mL IVPB (0 g Intravenous Stopped 03/28/16 1753)  labetalol (NORMODYNE,TRANDATE) injection 10 mg (10 mg Intravenous Given 03/28/16 1000)     Initial Impression / Assessment and Plan / ED Course  I have reviewed the triage vital signs and the nursing notes.  Pertinent labs &  imaging results that were available during my care of the patient were reviewed by me and considered in my medical decision making (see chart for details).  Clinical Course  Value Comment By Time  Temp: 98.2 F (36.8 C) (Reviewed) Bethel Born, PA-C 09/04 1653  ED EKG (Reviewed) Bethel Born, PA-C 09/45 355   82 year old presents with Acute respiratory failure with hypoxia due to HCAP. She is hypoxic on RA with Sats in the high 80's.  Rectal temp is 99.6. She is markedly hypertensive as well and it is unclear if she has been able to take her meds at her SNF due to intolerance. She is not in respiratory distress however she does appear very uncomfortable. CBC remarkable for leukocytosis of 12.5 and MCV of 105.5 without anemia. She has known B12 def. CMP is remarkable for mild elevation of BUN and decrease in albumin of 2.9. She has not been tolerating PO intake. BNP is 515 with no comparison. Troponin is 0.04. Lactic acid is .86. CXR remarkable for probable bronchitis and right upper lobe and right lower lobe bronchopneumonia. Small bilateral pleural effusions. Lung exam is unremarkable however she had just received a breathing tx. Vanc and Cefepime started empirically for HCAP. Spoke with Dr. Denna HaggardI Myers who will admit patient to stepdown. Labetolol given for BP.   Final Clinical Impressions(s) / ED Diagnoses   Final diagnoses:  HCAP (healthcare-associated pneumonia)  Acute respiratory failure with hypoxia [facility](HCC)    New Prescriptions New Prescriptions   No medications on file     Bethel BornKelly Marie Gekas, PA-C 03/28/16 1855    Lyndal Pulleyaniel Knott, MD 03/30/16 [phone]

## 2016-03-28 NOTE — ED Notes (Signed)
Pt being sent by PCP.  Pt was diagnosed w/ "unspecified PNA" and started on rocephin on 9/1.  Chest xray was negative and WBC was slightly elevated at that time.  Family called the on-call number today reporting "they think she is dying or something.  She is not getting any better."  Pt is afebrile and 93% on 3L [place].  Pt is typically not on home O2.

## 2016-03-28 NOTE — ED Notes (Signed)
Bed: [id number] Expected date:  Expected time:  Means of arrival:  Comments: EMS- Marga Hootsakley

## 2016-03-28 NOTE — Progress Notes (Addendum)
Pharmacy Antibiotic Note  Kristine [name] is a 82 y.o. female from [facility] admitted on 03/28/2016 with cough, on treatment with ceftriaxone since 9/1 for URI.  Pharmacy has been consulted for Vancomycin dosing for HCAP.  Cefepime x 1 ordered in ED. BMET collected.   Plan: Cefepime 1g IV x1 Vancomycin 1500mg  IV x1 F/u further Cefepime doses.    Height: 5\' 6"  (167.6 cm) Weight: 219 lb (99.3 kg) IBW/kg (Calculated) : 59.3  Temp (24hrs), Avg:98.2 F (36.8 C), Min:98.2 F (36.8 C), Max:98.2 F (36.8 C)   Recent Labs Lab 03/22/16  WBC 10.7    CrCl cannot be calculated (Patient's most recent lab result is older than the maximum 21 days allowed.).    Allergies  Allergen Reactions  . Amitriptyline     Weak sluggish  . Buspar [Buspirone]     Increased anxiety   . Codeine     Lethargic gitter   . Dyazide [Hydrochlorothiazide W-Triamterene]     weakness  . Macrolides And Ketolides     Rash     Antimicrobials this admission: 9/4 Cefepime >>  9/4 Vancomycin >>   Dose adjustments this admission:  Microbiology results:  Thank you for allowing pharmacy to be a part of this patient's care.  Haynes Hoehnolleen Stacie Knutzen, PharmD, BCPS 03/28/2016, 4:01 PM  Pager: [phone]  Update: SCr = 1.00, CrCl ~44 ml/min (N42) Vancomycin 1250mg  IV q24h F/u VT at [facility], renal function, cultures, clinical course F/u further cefepime doses

## 2016-03-28 NOTE — ED Triage Notes (Signed)
[facility] is from [facility].  Daughter in law reports that Pt has had cough for slightly longer than a week.  It is a constant cough with scant yellow/brown production.  She had CXR Friday at facility which was negative.  DX with URI and began Cipro Friday.  No improvement.

## 2016-03-29 LAB — URINALYSIS, ROUTINE W REFLEX MICROSCOPIC
BILIRUBIN URINE: NEGATIVE
Glucose, UA: NEGATIVE mg/dL
HGB URINE DIPSTICK: NEGATIVE
Ketones, ur: 15 mg/dL — AB
Leukocytes, UA: NEGATIVE
NITRITE: NEGATIVE
PH: 5.5 (ref 5.0–8.0)
Protein, ur: 100 mg/dL — AB
SPECIFIC GRAVITY, URINE: 1.027 (ref 1.005–1.030)

## 2016-03-29 LAB — CBC
HEMATOCRIT: 35.6 % — AB (ref 36.0–46.0)
HEMOGLOBIN: 11.5 g/dL — AB (ref 12.0–15.0)
MCH: 33.8 pg (ref 26.0–34.0)
MCHC: 32.3 g/dL (ref 30.0–36.0)
MCV: 104.7 fL — ABNORMAL HIGH (ref 78.0–100.0)
Platelets: 228 10*3/uL (ref 150–400)
RBC: 3.4 MIL/uL — AB (ref 3.87–5.11)
RDW: 13.4 % (ref 11.5–15.5)
WBC: 9.3 10*3/uL (ref 4.0–10.5)

## 2016-03-29 LAB — BASIC METABOLIC PANEL
ANION GAP: 5 (ref 5–15)
BUN: 25 mg/dL — ABNORMAL HIGH (ref 6–20)
CO2: 32 mmol/L (ref 22–32)
Calcium: 8.9 mg/dL (ref 8.9–10.3)
Chloride: 105 mmol/L (ref 101–111)
Creatinine, Ser: 0.85 mg/dL (ref 0.44–1.00)
GFR calc non Af Amer: 59 mL/min — ABNORMAL LOW (ref >60)
GLUCOSE: 98 mg/dL (ref 65–99)
POTASSIUM: 3.8 mmol/L (ref 3.5–5.1)
Sodium: 142 mmol/L (ref 135–145)

## 2016-03-29 LAB — URINE MICROSCOPIC-ADD ON: RBC / HPF: NONE SEEN RBC/hpf (ref 0–5)

## 2016-03-29 LAB — STREP PNEUMONIAE URINARY ANTIGEN: Strep Pneumo Urinary Antigen: NEGATIVE

## 2016-03-29 MED ORDER — HYDRALAZINE HCL 10 MG PO TABS
10.0000 mg | ORAL_TABLET | Freq: Three times a day (TID) | ORAL | Status: DC
  Administered 2016-03-29: 10 mg via ORAL
  Filled 2016-03-29: qty 1

## 2016-03-29 MED ORDER — HYDRALAZINE HCL 20 MG/ML IJ SOLN
5.0000 mg | INTRAMUSCULAR | Status: DC | PRN
  Administered 2016-03-29: 5 mg via INTRAVENOUS
  Filled 2016-03-29: qty 1

## 2016-03-29 MED ORDER — HYDRALAZINE HCL 20 MG/ML IJ SOLN
10.0000 mg | INTRAMUSCULAR | Status: DC | PRN
  Administered 2016-03-29 – 2016-03-30 (×3): 10 mg via INTRAVENOUS
  Filled 2016-03-29 (×4): qty 1

## 2016-03-29 MED ORDER — DEXTROSE 5 % IV SOLN
1.0000 g | Freq: Two times a day (BID) | INTRAVENOUS | Status: DC
  Administered 2016-03-29 – 2016-04-01 (×8): 1 g via INTRAVENOUS
  Filled 2016-03-29 (×9): qty 1

## 2016-03-29 MED ORDER — VANCOMYCIN HCL IN DEXTROSE 750-5 MG/150ML-% IV SOLN
750.0000 mg | Freq: Two times a day (BID) | INTRAVENOUS | Status: DC
  Administered 2016-03-29 – 2016-03-31 (×4): 750 mg via INTRAVENOUS
  Filled 2016-03-29 (×5): qty 150

## 2016-03-29 MED ORDER — HYDRALAZINE HCL 25 MG PO TABS
25.0000 mg | ORAL_TABLET | Freq: Three times a day (TID) | ORAL | Status: DC
  Administered 2016-03-29 – 2016-03-30 (×3): 25 mg via ORAL
  Filled 2016-03-29 (×3): qty 1

## 2016-03-29 NOTE — Progress Notes (Signed)
Patient ID: Kristine [name] Kristine [name], female   DOB: Nov 05, 1923, 82 y.[name].   MRN: [id number]    PROGRESS NOTE    Kristine [name] Kristine [name]  JXB:[id number] DOB: Nov 05, 1923 DOA: 03/28/2016  PCP: Merrilee SeashoreAnne Alexander, MD   Brief Narrative:  82 y.[name]. female with HTN, depression, who presented from [facility] with main concern of one week duration of progressively  Worsening cough and dyspnea, initially present with exertion and has progressed to dyspnea at rest over the past 48 hours, Cough has been mixed, non productive and occasionally production of clear sputum, pt does report difficulty bringing sputum up, saying she feels too week to cough anything up. Pt also reports subjective fevers, chills, malaise, poor oral intake. She has been having chest pain associated with coughing spells and has taken several medications for cough with no relief. No abd or urinary concerns. Pt also denies any specific focal neurological symptoms such as unilateral weakness, no problems with sensation, no visual concerns.   Assessment & Plan:  Active Problems:   Acute respiratory failure due to HCAP (healthcare-associated pneumonia) - RLL and RUL PNA, also concern for aspiration PNA, unknown pathogen  - continue vanc and maxipime day #2 - continue BD's scheduled and as needed, prn antitussives - follow up on sputum and blood cultures  - keep in SDU     Hypertensive urgency - at home on Coreg and Avapro  - labetalol was added on admission but pt now moe bradycardic - will stop coreg and labetalol - added hydralazine scheduled and as needed to see if that will help     Acute metabolic encephalopathy - more sleepy this AM - hold Xanax nad other sedating meds - keep in SDU    Hypothyroidism  - continue synthroid per home medical regimen   DVT prophylaxis: lovenox SQ Code Status: Full  Family Communication: Pt updated at bedside, daughter also at bedside  Disposition Plan: SNF once medically stable   Consultants:    None  Procedures:   None  Antimicrobials:   Vancomycin 9/4 -->  Maxipime 9/4   Subjective: More sleepy this am but denies chest pain and dyspnea.   Objective: Vitals:   03/29/16 1305 03/29/16 1400 03/29/16 1412 03/29/16 1640  BP: (!) 184/40 (!) 110/38  (!) 160/51  Pulse:  (!) 50    Resp:  17    Temp:      TempSrc:      SpO2:  96% 96%   Weight:      Height:        Intake/Output Summary (Last 24 hours) at 03/29/16 1703 Last data filed at 03/29/16 1100  Gross per 24 hour  Intake             1300 ml  Output              375 ml  Net              925 ml   Filed Weights   03/28/16 1459 03/28/16 1900 03/29/16 0500  Weight: 99.3 kg (219 lb) 95.5 kg (210 lb 8.6 oz) 95.5 kg (210 lb 8.6 oz)    Examination:  General exam: Appears calm and comfortable, sleepy but easy to awake  Respiratory system: Respiratory effort normal. Diminished breath sounds at bases with rhonchi Cardiovascular system: bradycardic, no rubs, gallops or clicks. No pedal edema. Gastrointestinal system: Abdomen is nondistended, soft and nontender. No organomegaly or masses felt. Central nervous system: somnolent but easy to awake, follows most of  the commands  Extremities: moving all 4 extremities spont, +1 bilateral LE edema   Data Reviewed: I have personally reviewed following labs and imaging studies  CBC:  Recent Labs Lab 03/28/16 1546 03/29/16 0345  WBC 12.5* 9.3  NEUTROABS 9.6*  --   HGB 12.5 11.5*  HCT 38.2 35.6*  MCV 105.5* 104.7*  PLT 254 228   Basic Metabolic Panel:  Recent Labs Lab 03/28/16 1546 03/29/16 0345  NA 142 142  K 4.1 3.8  CL 105 105  CO2 29 32  GLUCOSE 93 98  BUN 28* 25*  CREATININE 1.00 0.85  CALCIUM 9.6 8.9   Liver Function Tests:  Recent Labs Lab 03/28/16 1545  AST 25  ALT 25  ALKPHOS 87  BILITOT 0.7  PROT 6.9  ALBUMIN 2.9*   Urine analysis:    Component Value Date/Time   COLORURINE YELLOW 03/29/2016 0435   APPEARANCEUR CLEAR 03/29/2016  0435   LABSPEC 1.027 03/29/2016 0435   PHURINE 5.5 03/29/2016 0435   GLUCOSEU NEGATIVE 03/29/2016 0435   HGBUR NEGATIVE 03/29/2016 0435   BILIRUBINUR NEGATIVE 03/29/2016 0435   KETONESUR 15 (A) 03/29/2016 0435   PROTEINUR 100 (A) 03/29/2016 0435   UROBILINOGEN 1.0 04/01/2014 0203   NITRITE NEGATIVE 03/29/2016 0435   LEUKOCYTESUR NEGATIVE 03/29/2016 0435   Recent Results (from the past 240 hour(s))  MRSA PCR Screening     Status: None   Collection Time: 03/28/16  7:00 PM  Result Value Ref Range Status   MRSA by PCR NEGATIVE NEGATIVE Final  Culture, blood (routine x 2) Call MD if unable to obtain prior to antibiotics being given     Status: None (Preliminary result)   Collection Time: 03/28/16  7:50 PM  Result Value Ref Range Status   Specimen Description BLOOD LEFT ARM  Final   Special Requests BOTTLES DRAWN AEROBIC AND ANAEROBIC 10CC  Final   Culture   Final    NO GROWTH < 24 HOURS Performed at [facility]    Report Status PENDING  Incomplete  Culture, blood (routine x 2) Call MD if unable to obtain prior to antibiotics being given     Status: None (Preliminary result)   Collection Time: 03/28/16  7:51 PM  Result Value Ref Range Status   Specimen Description BLOOD LEFT ARM  Final   Special Requests BOTTLES DRAWN AEROBIC AND ANAEROBIC  5CC  Final   Culture   Final    NO GROWTH < 24 HOURS Performed at [facility]    Report Status PENDING  Incomplete      Radiology Studies: Dg Chest 2 View  Result Date: 03/28/2016 CLINICAL DATA:  One week history of cough. EXAM: CHEST  2 VIEW COMPARISON:  Chest x-ray 06/13/2015 FINDINGS: The heart is borderline enlarged but stable. Stable tortuosity and calcification of the thoracic aorta. Chronic underlying bronchitic type changes. There is also peribronchial thickening, increased interstitial markings and patchy right lung infiltrates. Small bilateral pleural effusions are also noted. The bony thorax is intact. IMPRESSION:  Probable bronchitis and right upper lobe and right lower lobe bronchopneumonia. Small bilateral pleural effusions. Electronically Signed   By: Rudie Meyer M.D.   On: 03/28/2016 16:35      Scheduled Meds: . acetaminophen  650 mg Oral Q8H  . aspirin  81 mg Oral Daily  . ceFEPime (MAXIPIME) IV  1 g Intravenous Q12H  . Dextromethorphan-Quinidine  1 capsule Oral BID  . enoxaparin (LOVENOX) injection  40 mg Subcutaneous Q24H  . escitalopram  20 mg Oral Daily  . guaiFENesin  600 mg Oral BID  . hydrALAZINE  10 mg Oral Q8H  . irbesartan  300 mg Oral Daily  . levalbuterol  0.63 mg Nebulization TID  . levothyroxine  150 mcg Oral QAC breakfast  . mouth rinse  15 mL Mouth Rinse BID  . polyvinyl alcohol  1 drop Both Eyes BID  . vancomycin  750 mg Intravenous Q12H  . vitamin B-12  1,000 mcg Oral Daily   Continuous Infusions: . sodium chloride 75 mL/hr at 03/29/16 0854     LOS: 1 day    Time spent: 20 minutes    Debbora Presto, MD [facility] Pager [phone]  If 7PM-7AM, please contact night-coverage [email or web address] Password [email or web address] 03/29/2016, 5:03 PM

## 2016-03-29 NOTE — Progress Notes (Signed)
Pharmacy Antibiotic Note  Kristine SalvageWilma O [name] is a 82 y.o. female from [facility] admitted on 03/28/2016 with cough, on treatment with ceftriaxone since 9/1 for URI.  Pharmacy has been consulted for Vancomycin dosing for HCAP.    Plan:  Increase Cefepime to  1g IV q12h  Increase Vancomycin to 750mg  IV q12h - consider stopping since MRSA PCR (-)  Follow up renal function & cultures  Height: 5\' 5"  (165.1 cm) Weight: 210 lb 8.6 oz (95.5 kg) IBW/kg (Calculated) : 57  Temp (24hrs), Avg:98.3 F (36.8 C), Min:97.5 F (36.4 C), Max:99.6 F (37.6 C)   Recent Labs Lab 03/28/16 1546 03/28/16 1555 03/29/16 0345  WBC 12.5*  --  9.3  CREATININE 1.00  --  0.85  LATICACIDVEN  --  0.86  --     Estimated Creatinine Clearance: 50.3 mL/min (by C-G formula based on SCr of 0.85 mg/dL).    Allergies  Allergen Reactions  . Amitriptyline     Weak sluggish  . Buspar [Buspirone]     Increased anxiety   . Codeine     Lethargic gitter   . Dyazide [Hydrochlorothiazide W-Triamterene]     weakness  . Macrolides And Ketolides     Rash     Antimicrobials this admission: 9/4 Cefepime >>  9/4 Vancomycin >>    Dose adjustments this admission: 9/5 increase vanc 750mg  q12h and cefepime to 1g q12h for improved renal function   Microbiology results: 9/4 MRSA PCR: (-) 9/4 BCx: sent 9/5 UCx: sent  Thank you for allowing pharmacy to be a part of this patient's care.  Loralee PacasErin Jamonta Goerner, PharmD, BCPS Pager: [phone] 03/29/2016, 9:07 AM

## 2016-03-29 NOTE — Evaluation (Signed)
SLP Cancellation Note  Patient Details Name: Kristine SalvageWilma O Kellman MRN: [id number] DOB: 14-Apr-1924   Cancelled treatment:       Reason Eval/Treat Not Completed: Medical issues which prohibited therapy (rn reports HR in 50s and pt unstable at this time, will defer swallow evaluation at this time)  Donavan Burnetamara Matyas Baisley, MS [facility] SLP [phone]

## 2016-03-29 NOTE — Progress Notes (Signed)
Pt has not voided since 1900 (beginning of shift). Bladder scan showed 350 ccs. MD Donnamarie Poag(K. Kirby) notified. Order was placed to catheterize PRN. In and out cath was performed. 375 ccs urine was drained. Will continue to monitor.

## 2016-03-30 ENCOUNTER — Inpatient Hospital Stay (HOSPITAL_COMMUNITY): Payer: Medicare Other

## 2016-03-30 LAB — CBC
HCT: 36.2 % (ref 36.0–46.0)
Hemoglobin: 11.8 g/dL — ABNORMAL LOW (ref 12.0–15.0)
MCH: 34 pg (ref 26.0–34.0)
MCHC: 32.6 g/dL (ref 30.0–36.0)
MCV: 104.3 fL — AB (ref 78.0–100.0)
PLATELETS: 254 10*3/uL (ref 150–400)
RBC: 3.47 MIL/uL — AB (ref 3.87–5.11)
RDW: 13.5 % (ref 11.5–15.5)
WBC: 12.6 10*3/uL — AB (ref 4.0–10.5)

## 2016-03-30 LAB — BASIC METABOLIC PANEL
ANION GAP: 7 (ref 5–15)
BUN: 21 mg/dL — ABNORMAL HIGH (ref 6–20)
CO2: 28 mmol/L (ref 22–32)
Calcium: 9 mg/dL (ref 8.9–10.3)
Chloride: 106 mmol/L (ref 101–111)
Creatinine, Ser: 0.75 mg/dL (ref 0.44–1.00)
GLUCOSE: 105 mg/dL — AB (ref 65–99)
POTASSIUM: 3.9 mmol/L (ref 3.5–5.1)
Sodium: 141 mmol/L (ref 135–145)

## 2016-03-30 LAB — URINE CULTURE: Culture: NO GROWTH

## 2016-03-30 LAB — LEGIONELLA PNEUMOPHILA SEROGP 1 UR AG: L. pneumophila Serogp 1 Ur Ag: NEGATIVE

## 2016-03-30 MED ORDER — HYDRALAZINE HCL 20 MG/ML IJ SOLN
20.0000 mg | INTRAMUSCULAR | Status: DC | PRN
  Administered 2016-03-30 – 2016-04-06 (×9): 20 mg via INTRAVENOUS
  Filled 2016-03-30 (×8): qty 1

## 2016-03-30 MED ORDER — FUROSEMIDE 10 MG/ML IJ SOLN
20.0000 mg | Freq: Once | INTRAMUSCULAR | Status: AC
  Administered 2016-03-30: 20 mg via INTRAVENOUS
  Filled 2016-03-30: qty 2

## 2016-03-30 MED ORDER — LIP MEDEX EX OINT
TOPICAL_OINTMENT | CUTANEOUS | Status: DC | PRN
  Filled 2016-03-30: qty 7

## 2016-03-30 MED ORDER — HYDRALAZINE HCL 50 MG PO TABS
50.0000 mg | ORAL_TABLET | Freq: Three times a day (TID) | ORAL | Status: DC
  Administered 2016-03-30 – 2016-04-01 (×5): 50 mg via ORAL
  Filled 2016-03-30 (×5): qty 1

## 2016-03-30 NOTE — Clinical Social Work Note (Signed)
Clinical Social Work Assessment  Patient Details  Name: Kristine [name] MRN: [id number] Date of Birth: July 26, 1923  Date of referral:  03/30/16               Reason for consult:  Discharge Planning, Facility Placement                Permission sought to share information with:  Facility Art therapist granted to share information::  Yes, Verbal Permission Granted  Name::        Agency::     Relationship::     Contact Information:     Housing/Transportation Living arrangements for the past 2 months:  [place] of Information:  Adult Children Patient Interpreter Needed:  None Criminal Activity/Legal Involvement Pertinent to Current Situation/Hospitalization:  No - Comment as needed Significant Relationships:  Adult Children Lives with:  Facility Resident Do you feel safe going back to the place where you live?  Yes Need for family participation in patient care:  Yes (Comment)  Care giving concerns:  No concerns reported by daughter at this time.   Social Worker assessment / plan:  Pt hospitalized on 03/28/16 from [place] with HCAP. Pt is a LTC resident at [facility]. CSW met briefly with pt to offer support. CSW contacted pt's daughter, Kristine [name] [phone],  to assist with d/c planning. Daughter would like pt to return to SNF at d/c. SNF contacted and clinicals sent for review. SNF will readmit pt when she is stable for d/c. CSW will continue to follow to assist with d/c planning.   Employment status:  Retired Forensic scientist:  [place]icaid [place], [employer] PT Recommendations:  Not assessed at this time Information / Referral to community resources:  [place]  Patient/Family's Response to care:  Daughter expects pt to return to SNF at d/c.  Patient/Family's Understanding of and Emotional Response to Diagnosis, Current Treatment, and Prognosis:  Daughter is aware of pt's medical status. Pt is anxious this am,  " I keep coughing . I need help. " Nsg assisting.  Support provided.  Emotional Assessment Appearance:  Appears stated age Attitude/Demeanor/Rapport:  Other (cooperative) Affect (typically observed):  Anxious Orientation:  Oriented to Self, Oriented to Situation, Oriented to Place Alcohol / Substance use:  Not Applicable Psych involvement (Current and /or in the community):  No (Comment)  Discharge Needs  Concerns to be addressed:  Discharge Planning Concerns Readmission within the last 30 days:  No Current discharge risk:  None Barriers to Discharge:  No Barriers Identified   Kristine [name], [place] 03/30/2016, 12:03 PM

## 2016-03-30 NOTE — NC FL2 (Signed)
[place] MEDICAID FL2 LEVEL OF CARE SCREENING TOOL     IDENTIFICATION  Patient Name: Kristine SalvageWilma O Ohlin Birthdate: 1923/12/02 Sex: female Admission Date (Current Location): 03/28/2016  [facility] [place]   Facility and Address:  [facility],  [address], [place] [id number]      Provider Number: [id number]  Attending Physician Name and Address:  Dorothea OgleIskra M Myers, MD  Relative Name and Phone Number:       Current Level of Care: Hospital Recommended Level of Care: Skilled Nursing Facility Prior Approval Number:    Date Approved/Denied:   PASRR Number: [id number]  Discharge Plan: SNF    Current Diagnoses: Patient Active Problem List   Diagnosis Date Noted  . HCAP (healthcare-associated pneumonia) 03/28/2016  . Hypertensive urgency 03/28/2016  . Acute respiratory failure with hypoxia (HCC) 03/26/2016  . Osteoporosis 02/12/2016  . Hyperlipidemia 09/13/2015  . Pre-diabetes 08/02/2015  . Morbid obesity (HCC) 06/14/2015  . Benign essential HTN 06/14/2015  . Dysphagia 06/13/2015  . Depression 03/18/2015  . Hypothyroidism 02/23/2007  . Anemia, B12 deficiency 02/23/2007  . CKD (chronic kidney disease) stage 2, GFR 60-89 ml/min 02/23/2007    Orientation RESPIRATION BLADDER Height & Weight     Self, Place, Situation  O2 Incontinent Weight: 216 lb 7.9 oz (98.2 kg) Height:  5\' 5"  (165.1 cm)  BEHAVIORAL SYMPTOMS/MOOD NEUROLOGICAL BOWEL NUTRITION STATUS  Other (Comment) (no behaviors)   Continent Diet  AMBULATORY STATUS COMMUNICATION OF NEEDS Skin   Extensive Assist Verbally Normal                       Personal Care Assistance Level of Assistance  Bathing, Feeding, Dressing Bathing Assistance: Maximum assistance Feeding assistance: Limited assistance Dressing Assistance: Maximum assistance     Functional Limitations Info  Sight, Hearing, Speech Sight Info: Adequate Hearing Info: Impaired Speech Info: Adequate    SPECIAL CARE  FACTORS FREQUENCY                       Contractures Contractures Info: Not present    Additional Factors Info  Code Status, Allergies, Psychotropic Code Status Info: Full Code             Current Medications (03/30/2016):  This is the current hospital active medication list Current Facility-Administered Medications  Medication Dose Route Frequency Provider Last Rate Last Dose  . 0.9 %  sodium chloride infusion   Intravenous Continuous Dorothea OgleIskra M Myers, MD 50 mL/hr at 03/30/16 0055    . acetaminophen (TYLENOL) tablet 650 mg  650 mg Oral Q8H Dorothea OgleIskra M Myers, MD   650 mg at 03/30/16 0545  . aspirin chewable tablet 81 mg  81 mg Oral Daily Dorothea OgleIskra M Myers, MD   81 mg at 03/30/16 1118  . ceFEPIme (MAXIPIME) 1 g in dextrose 5 % 50 mL IVPB  1 g Intravenous Q12H Rollene Farerin R Williamson, RPH   1 g at 03/30/16 1113  . Dextromethorphan-Quinidine 20-10 MG CAPS 1 capsule  1 capsule Oral BID Dorothea OgleIskra M Myers, MD   1 capsule at 03/30/16 1114  . enoxaparin (LOVENOX) injection 40 mg  40 mg Subcutaneous Q24H Dorothea OgleIskra M Myers, MD   40 mg at 03/29/16 2019  . escitalopram (LEXAPRO) tablet 20 mg  20 mg Oral Daily Dorothea OgleIskra M Myers, MD   20 mg at 03/30/16 1113  . guaiFENesin (MUCINEX) 12 hr tablet 600 mg  600 mg Oral BID Dorothea OgleIskra M Myers, MD  600 mg at 03/30/16 1118  . guaiFENesin-dextromethorphan (ROBITUSSIN DM) 100-10 MG/5ML syrup 5 mL  5 mL Oral Q4H PRN Dorothea Ogle, MD      . hydrALAZINE (APRESOLINE) injection 20 mg  20 mg Intravenous Q3H PRN Dorothea Ogle, MD   20 mg at 03/30/16 0837  . hydrALAZINE (APRESOLINE) tablet 25 mg  25 mg Oral Q8H Dorothea Ogle, MD   25 mg at 03/30/16 0545  . irbesartan (AVAPRO) tablet 300 mg  300 mg Oral Daily Dorothea Ogle, MD   300 mg at 03/30/16 1114  . levalbuterol (XOPENEX) nebulizer solution 0.63 mg  0.63 mg Nebulization Q6H PRN Dorothea Ogle, MD      . levalbuterol [facility]) nebulizer solution 0.63 mg  0.63 mg Nebulization TID Dorothea Ogle, MD   0.63 mg at 03/30/16 0747  . levothyroxine  (SYNTHROID, LEVOTHROID) tablet 150 mcg  150 mcg Oral QAC breakfast Dorothea Ogle, MD   150 mcg at 03/30/16 [id number]  . MEDLINE mouth rinse  15 mL Mouth Rinse BID Dorothea Ogle, MD   15 mL at 03/30/16 1000  . ondansetron (ZOFRAN) tablet 4 mg  4 mg Oral Q6H PRN Dorothea Ogle, MD       Or  . ondansetron [facility]) injection 4 mg  4 mg Intravenous Q6H PRN Dorothea Ogle, MD      . polyvinyl alcohol (LIQUIFILM TEARS) 1.4 % ophthalmic solution 1 drop  1 drop Both Eyes BID Dorothea Ogle, MD   1 drop at 03/30/16 1114  . traMADol (ULTRAM) tablet 50 mg  50 mg Oral Q12H PRN Dorothea Ogle, MD   50 mg at 03/30/16 1116  . vancomycin (VANCOCIN) IVPB 750 mg/150 ml premix  750 mg Intravenous Q12H Rollene Fare, RPH   750 mg at 03/30/16 0544  . vitamin B-12 (CYANOCOBALAMIN) tablet 1,000 mcg  1,000 mcg Oral Daily Dorothea Ogle, MD   1,000 mcg at 03/30/16 1113     Discharge Medications: Please see discharge summary for a list of discharge medications.  Relevant Imaging Results:  Relevant Lab Results:   Additional Information SSN [id number]  Katiana Ruland, [place] Gave, LCSW

## 2016-03-30 NOTE — Evaluation (Signed)
SLP Cancellation Note  Patient Details Name: Kristine [name] MRN: [id number] DOB: 01-13-1924   Cancelled treatment:       Reason Eval/Treat Not Completed: Other (comment) (pt working with staff members at this time, will continue efforts)   Donavan Burnetamara Lynnsie Linders, MS [facility] SLP [phone]

## 2016-03-30 NOTE — Progress Notes (Signed)
Patient ID: Kristine SalvageWilma O [name], female   DOB: 10-05-1923, 82 y.o.   MRN: [id number]    PROGRESS NOTE    Kristine [name]  WUJ:[id number] DOB: 10-05-1923 DOA: 03/28/2016  PCP: Merrilee SeashoreAnne Alexander, MD   Brief Narrative:  82 y.o. female with HTN, depression, who presented from [facility] with main concern of one week duration of progressively  Worsening cough and dyspnea, initially present with exertion and has progressed to dyspnea at rest over the past 48 hours, Cough has been mixed, non productive and occasionally production of clear sputum, pt does report difficulty bringing sputum up, saying she feels too week to cough anything up. Pt also reports subjective fevers, chills, malaise, poor oral intake. She has been having chest pain associated with coughing spells and has taken several medications for cough with no relief. No abd or urinary concerns. Pt also denies any specific focal neurological symptoms such as unilateral weakness, no problems with sensation, no visual concerns.   Assessment & Plan:  Active Problems:   Acute respiratory failure due to HCAP (healthcare-associated pneumonia) - RLL and RUL PNA, also concern for aspiration PNA, unknown pathogen  - continue vanc and maxipime day #3 - continue BD's scheduled and as needed, prn antitussives - pt still rather dyspneic so will proceed with CT chest for clearer evaluation  - due to concern of clinical deterioration, will not change ABX to PO yet     Hypertensive urgency - at home on Coreg and Avapro, coreg was held due to bradycardia  - added hydralazine scheduled and as needed but BP still elevated - will add Lasix as well as pt with more crackles on exam  - will not transfer out of SDU due to concern of clinical deterioration and worsening uncontrolled HTN     Acute metabolic encephalopathy - more alert this AM  - hold Xanax nad other sedating meds - keep in SDU    Hypothyroidism  - continue synthroid per home medical regimen     DVT prophylaxis: lovenox SQ Code Status: Full  Family Communication: Pt updated at bedside, daughter called on phone but no answer, message left and awaiting call back  Disposition Plan: SNF once medically stable, keep in SDU for now  Consultants:   None  Procedures:   None  Antimicrobials:   Vancomycin 9/4 -->  Maxipime 9/4 -->   Subjective: More alert this AM and says still short of breath with more cough.   Objective: Vitals:   03/30/16 1345 03/30/16 1500 03/30/16 1600 03/30/16 1617  BP: (!) 203/47 (!) 155/44 (!) 174/44   Pulse:  71 77   Resp:  (!) 24 (!) 23   Temp:    98.1 F (36.7 C)  TempSrc:    Oral  SpO2:  96% 94%   Weight:      Height:        Intake/Output Summary (Last 24 hours) at 03/30/16 1629 Last data filed at 03/30/16 1600  Gross per 24 hour  Intake          1735.02 ml  Output             1025 ml  Net           710.02 ml   Filed Weights   03/28/16 1900 03/29/16 0500 03/30/16 0500  Weight: 95.5 kg (210 lb 8.6 oz) 95.5 kg (210 lb 8.6 oz) 98.2 kg (216 lb 7.9 oz)    Examination:  General exam: Appears more alert but coughing.  Respiratory system: Diminished breath sounds at bases with rhonchi and crackles Cardiovascular system: regular rhythm and rate, no rubs, gallops or clicks. No pedal edema. Gastrointestinal system: Abdomen is nondistended, soft and nontender. No organomegaly or masses felt. Central nervous system: moving all 4 extremities, very HOH, 1 + bilateral LE edema   Data Reviewed: I have personally reviewed following labs and imaging studies  CBC:  Recent Labs Lab 03/28/16 1546 03/29/16 0345 03/30/16 0359  WBC 12.5* 9.3 12.6*  NEUTROABS 9.6*  --   --   HGB 12.5 11.5* 11.8*  HCT 38.2 35.6* 36.2  MCV 105.5* 104.7* 104.3*  PLT 254 228 254   Basic Metabolic Panel:  Recent Labs Lab 03/28/16 1546 03/29/16 0345 03/30/16 0359  NA 142 142 141  K 4.1 3.8 3.9  CL 105 105 106  CO2 29 32 28  GLUCOSE 93 98 105*  BUN  28* 25* 21*  CREATININE 1.00 0.85 0.75  CALCIUM 9.6 8.9 9.0   Liver Function Tests:  Recent Labs Lab 03/28/16 1545  AST 25  ALT 25  ALKPHOS 87  BILITOT 0.7  PROT 6.9  ALBUMIN 2.9*   Urine analysis:    Component Value Date/Time   COLORURINE YELLOW 03/29/2016 0435   APPEARANCEUR CLEAR 03/29/2016 0435   LABSPEC 1.027 03/29/2016 0435   PHURINE 5.5 03/29/2016 0435   GLUCOSEU NEGATIVE 03/29/2016 0435   HGBUR NEGATIVE 03/29/2016 0435   BILIRUBINUR NEGATIVE 03/29/2016 0435   KETONESUR 15 (A) 03/29/2016 0435   PROTEINUR 100 (A) 03/29/2016 0435   UROBILINOGEN 1.0 04/01/2014 0203   NITRITE NEGATIVE 03/29/2016 0435   LEUKOCYTESUR NEGATIVE 03/29/2016 0435   Recent Results (from the past 240 hour(s))  MRSA PCR Screening     Status: None   Collection Time: 03/28/16  7:00 PM  Result Value Ref Range Status   MRSA by PCR NEGATIVE NEGATIVE Final  Culture, blood (routine x 2) Call MD if unable to obtain prior to antibiotics being given     Status: None (Preliminary result)   Collection Time: 03/28/16  7:50 PM  Result Value Ref Range Status   Specimen Description BLOOD LEFT ARM  Final   Special Requests BOTTLES DRAWN AEROBIC AND ANAEROBIC 10CC  Final   Culture   Final    NO GROWTH < 24 HOURS Performed at [facility]    Report Status PENDING  Incomplete  Culture, blood (routine x 2) Call MD if unable to obtain prior to antibiotics being given     Status: None (Preliminary result)   Collection Time: 03/28/16  7:51 PM  Result Value Ref Range Status   Specimen Description BLOOD LEFT ARM  Final   Special Requests BOTTLES DRAWN AEROBIC AND ANAEROBIC  5CC  Final   Culture   Final    NO GROWTH < 24 HOURS Performed at [facility]    Report Status PENDING  Incomplete      Radiology Studies: Dg Chest 2 View  Result Date: 03/28/2016 CLINICAL DATA:  One week history of cough. EXAM: CHEST  2 VIEW COMPARISON:  Chest x-ray 06/13/2015 FINDINGS: The heart is borderline enlarged  but stable. Stable tortuosity and calcification of the thoracic aorta. Chronic underlying bronchitic type changes. There is also peribronchial thickening, increased interstitial markings and patchy right lung infiltrates. Small bilateral pleural effusions are also noted. The bony thorax is intact. IMPRESSION: Probable bronchitis and right upper lobe and right lower lobe bronchopneumonia. Small bilateral pleural effusions. Electronically Signed   By: Rudie Meyer  M.D.   On: 03/28/2016 16:35      Scheduled Meds: . acetaminophen  650 mg Oral Q8H  . aspirin  81 mg Oral Daily  . ceFEPime (MAXIPIME) IV  1 g Intravenous Q12H  . Dextromethorphan-Quinidine  1 capsule Oral BID  . enoxaparin (LOVENOX) injection  40 mg Subcutaneous Q24H  . escitalopram  20 mg Oral Daily  . furosemide  20 mg Intravenous Once  . guaiFENesin  600 mg Oral BID  . hydrALAZINE  50 mg Oral Q8H  . irbesartan  300 mg Oral Daily  . levalbuterol  0.63 mg Nebulization TID  . levothyroxine  150 mcg Oral QAC breakfast  . mouth rinse  15 mL Mouth Rinse BID  . polyvinyl alcohol  1 drop Both Eyes BID  . vancomycin  750 mg Intravenous Q12H  . vitamin B-12  1,000 mcg Oral Daily   Continuous Infusions:     LOS: 2 days    Time spent: 20 minutes    Debbora Presto, MD [facility] Pager [phone]  If 7PM-7AM, please contact night-coverage [email or web address] Password [email or web address] 03/30/2016, 4:29 PM

## 2016-03-31 ENCOUNTER — Inpatient Hospital Stay (HOSPITAL_COMMUNITY): Payer: Medicare Other

## 2016-03-31 DIAGNOSIS — Z515 Encounter for palliative care: Secondary | ICD-10-CM

## 2016-03-31 DIAGNOSIS — J9601 Acute respiratory failure with hypoxia: Secondary | ICD-10-CM

## 2016-03-31 DIAGNOSIS — R0600 Dyspnea, unspecified: Secondary | ICD-10-CM

## 2016-03-31 DIAGNOSIS — Z789 Other specified health status: Secondary | ICD-10-CM

## 2016-03-31 DIAGNOSIS — Z7189 Other specified counseling: Secondary | ICD-10-CM

## 2016-03-31 LAB — ECHOCARDIOGRAM COMPLETE
HEIGHTINCHES: 65 in
Weight: 3393.32 oz

## 2016-03-31 LAB — CBC
HEMATOCRIT: 36.4 % (ref 36.0–46.0)
HEMOGLOBIN: 11.6 g/dL — AB (ref 12.0–15.0)
MCH: 33.6 pg (ref 26.0–34.0)
MCHC: 31.9 g/dL (ref 30.0–36.0)
MCV: 105.5 fL — ABNORMAL HIGH (ref 78.0–100.0)
Platelets: 292 10*3/uL (ref 150–400)
RBC: 3.45 MIL/uL — AB (ref 3.87–5.11)
RDW: 14.1 % (ref 11.5–15.5)
WBC: 12.6 10*3/uL — AB (ref 4.0–10.5)

## 2016-03-31 LAB — BASIC METABOLIC PANEL
ANION GAP: 8 (ref 5–15)
BUN: 18 mg/dL (ref 6–20)
CHLORIDE: 102 mmol/L (ref 101–111)
CO2: 30 mmol/L (ref 22–32)
Calcium: 9.2 mg/dL (ref 8.9–10.3)
Creatinine, Ser: 0.77 mg/dL (ref 0.44–1.00)
GFR calc non Af Amer: >60 mL/min (ref >60)
Glucose, Bld: 124 mg/dL — ABNORMAL HIGH (ref 65–99)
POTASSIUM: 3.5 mmol/L (ref 3.5–5.1)
Sodium: 140 mmol/L (ref 135–145)

## 2016-03-31 LAB — TROPONIN I: TROPONIN I: 0.05 ng/mL — AB (ref <0.03)

## 2016-03-31 MED ORDER — NITROGLYCERIN IN D5W 200-5 MCG/ML-% IV SOLN
0.0000 ug/min | INTRAVENOUS | Status: DC
  Administered 2016-03-31: 160 ug/min via INTRAVENOUS
  Administered 2016-03-31: 40 ug/min via INTRAVENOUS
  Administered 2016-03-31: 5 ug/min via INTRAVENOUS
  Administered 2016-04-01: 195 ug/min via INTRAVENOUS
  Administered 2016-04-01: 200 ug/min via INTRAVENOUS
  Administered 2016-04-01: 150 ug/min via INTRAVENOUS
  Filled 2016-03-31 (×5): qty 250

## 2016-03-31 MED ORDER — PERFLUTREN LIPID MICROSPHERE
INTRAVENOUS | Status: AC
  Filled 2016-03-31: qty 10

## 2016-03-31 MED ORDER — PERFLUTREN LIPID MICROSPHERE
1.0000 mL | INTRAVENOUS | Status: AC | PRN
  Administered 2016-03-31: 1 mL via INTRAVENOUS
  Filled 2016-03-31: qty 10

## 2016-03-31 NOTE — Evaluation (Addendum)
Clinical/Bedside Swallow Evaluation Patient Details  Name: KERENSA NICKLAS MRN: [id number] Date of Birth: Oct 07, 1923  Today's Date: 03/31/2016 Time: SLP Start Time (ACUTE ONLY): 1028 SLP Stop Time (ACUTE ONLY): 1049 SLP Time Calculation (min) (ACUTE ONLY): 21 min  Past Medical History:  Past Medical History:  Diagnosis Date  . Bilateral leg edema   . Hyperlipidemia   . Hypertension    Past Surgical History:  Past Surgical History:  Procedure Laterality Date  . ANKLE FRACTURE SURGERY    . CHOLECYSTECTOMY     HPI:  82 yo female adm to [facility] with respiratory difficulties.  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  Pt found to have suspected Masslike but irregular and ill-defined subpleural opacity in thesuperior segment of the left lower lobe may represent a region of rounded/masslike pneumonia, or a true pulmonary mass. Pneumonia is favored.   Swallow evaluation ordered.     Assessment / Plan / Recommendation Clinical Impression  Functional oropharyngeal swallow based on clinical swallow evaluation with minimal intake pt would accept *single bite of applesauce, cracker, Ensure, water via straw.  Adequate mastication with timely swallow and clear voice throughout intake.  No focal CN deficits although pt does have prior cvas per imaging studies 05/2015.    Pt seen by this SLP previously 05/2015 and at that time she pointed to mid=esophagus to indicate area of difficulty = she now however denies this.     Coughing noted several minutes AFTER small snack - pt denies reflux/esoph deficits at this time.    Recommend continue regular/thin diet when pt fully alert. If pt is aspirating, suspect it's primary due to possible esophageal deficits.   Thanks for this referral.     Aspiration Risk  Mild aspiration risk    Diet Recommendation Regular;Thin liquid   Liquid Administration via: Cup;Straw Medication  Administration: Whole meds with puree Supervision: Full supervision/cueing for compensatory strategies Compensations: Slow rate;Small sips/bites    Other  Recommendations Oral Care Recommendations: Oral care BID   Follow up Recommendations  None    Frequency and Duration            Prognosis   n/a     Swallow Study   General Date of Onset: 03/31/16 HPI: 82 yo female adm to [facility] with respiratory difficulties.  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  Pt found to have suspected Masslike but irregular and ill-defined subpleural opacity in thesuperior segment of the left lower lobe may represent a region of rounded/masslike pneumonia, or a true pulmonary mass. Pneumonia is favored.   Swallow evaluation ordered.   Type of Study: Bedside Swallow Evaluation Previous Swallow Assessment: BSE 05/2015 Diet Prior to this Study: Regular;Thin liquids Temperature Spikes Noted: No Respiratory Status: Nasal cannula History of Recent Intubation: No Behavior/Cognition: Lethargic/Drowsy;Cooperative;Pleasant mood Oral Cavity Assessment: Within Functional Limits Oral Care Completed by SLP: No Oral Cavity - Dentition: Adequate natural dentition Self-Feeding Abilities: Needs assist Patient Positioning: Upright in bed Baseline Vocal Quality: Low vocal intensity Volitional Cough: Weak Volitional Swallow: Able to elicit    Oral/Motor/Sensory Function Overall Oral Motor/Sensory Function: Within functional limits   Ice Chips Ice chips: Not tested   Thin Liquid Thin Liquid: Within functional limits Presentation: Straw    Nectar Thick Nectar Thick Liquid: Not tested   Honey Thick Honey Thick Liquid: Not tested   Puree Puree: Within  functional limits Presentation: Spoon   Solid   GO   Solid: Within functional limits        Donavan Burnetamara Cleora Karnik, MS [facility] SLP [phone]

## 2016-03-31 NOTE — Progress Notes (Signed)
  Echocardiogram 2D Echocardiogram with Definity has been performed.  Leta JunglingCooper, Monic Engelmann M 03/31/2016, 12:29 PM

## 2016-03-31 NOTE — Progress Notes (Signed)
Date:  March 31, 2016 Chart reviewed for concurrent status and case management needs. Will continue to follow the patient for status change:  cxr of [id number]/ wcb 12.9 Discharge Planning: following for needs Expected discharge date: [id number] Marcelle SmilingRhonda Davis, BSN, [email or web address], [place]   [phone]

## 2016-03-31 NOTE — Progress Notes (Signed)
Patient ID: Kristine [name], female   DOB: 08-Dec-1923, 82 y.o.   MRN: [id number]    PROGRESS NOTE    NISHA DHAMI  WUJ:[id number] DOB: [birth year] DOA: 03/28/2016  PCP: Merrilee Seashore, MD   Brief Narrative:  82 y.o. female with HTN, depression, who presented from [facility] with main concern of one week duration of progressively  Worsening cough and dyspnea, initially present with exertion and has progressed to dyspnea at rest over the past 48 hours, Cough has been mixed, non productive and occasionally production of clear sputum, pt does report difficulty bringing sputum up, saying she feels too week to cough anything up. Pt also reports subjective fevers, chills, malaise, poor oral intake. She has been having chest pain associated with coughing spells and has taken several medications for cough with no relief. No abd or urinary concerns. Pt also denies any specific focal neurological symptoms such as unilateral weakness, no problems with sensation, no visual concerns.   Assessment & Plan:  Active Problems:   Acute respiratory failure due to HCAP (healthcare-associated pneumonia) - RLL and RUL PNA, also concern for aspiration PNA, unknown pathogen, . LLL PNA per CT chest  - continue vanc and maxipime day #4, I think it is reasonable to stop vancomycin today 9/7 and continue only Maxipime  - continue BD's scheduled and as needed, prn antitussives - CT chest notable for mass-like, irregular and ill-defined subpleural opacity in the superior segment of the left lower lobe may represent a region of rounded/masslike pneumonia, or a true pulmonary mass. Pneumonia favored.  - Followup PA and lateral chest X-ray is recommended in 3-4 weeks following trial of antibiotic therapy to ensure resolution and exclude underlying malignancy - PCT consulted for further GOC discussion and will provide further recommendations once PCT meets with family     Hypertensive urgency - at home on Coreg and Avapro,  coreg was held due to bradycardia  - added hydralazine scheduled and as needed but BP still elevated - adding lasix did not help much either - place don nitro drip     Acute metabolic encephalopathy - more alert this AM  - continue hold Xanax nad other sedating meds - keep in SDU    Hypothyroidism  - continue synthroid per home medical regimen  - check TSH  DVT prophylaxis: lovenox SQ Code Status: Full  Family Communication: Pt updated at bedside, daughter called on phone but no answer, message left and awaiting call back  Disposition Plan: SNF once medically stable, keep in SDU for now  Consultants:   PCT  Procedures:   None  Antimicrobials:   Vancomycin 9/4 --> 9/7  Maxipime 9/4 -->   Subjective: More alert this AM and says still short of breath with less cough.   Objective: Vitals:   03/31/16 1630 03/31/16 1640 03/31/16 1650 03/31/16 1700  BP: (!) 164/43 (!) 145/50 (!) 168/42 (!) 162/39  Pulse: 66 67 72 72  Resp: 18 (!) 21 17 (!) 23  Temp:      TempSrc:      SpO2: 94% 94% 93% 93%  Weight:      Height:        Intake/Output Summary (Last 24 hours) at 03/31/16 1754 Last data filed at 03/31/16 1744  Gross per 24 hour  Intake           307.28 ml  Output             1650 ml  Net         -  1342.72 ml   Filed Weights   03/29/16 0500 03/30/16 0500 03/31/16 0500  Weight: 95.5 kg (210 lb 8.6 oz) 98.2 kg (216 lb 7.9 oz) 96.2 kg (212 lb 1.3 oz)    Examination:  General exam: Appears more alert but coughing.  Respiratory system: Diminished breath sounds at bases with rhonchi and crackles bilateral  Cardiovascular system: regular rhythm and rate, no rubs, gallops or clicks. No pedal edema. Gastrointestinal system: Abdomen is nondistended, soft and nontender. No organomegaly or masses felt. Central nervous system: moving all 4 extremities, very HOH, 1 + bilateral LE edema   Data Reviewed: I have personally reviewed following labs and imaging  studies  CBC:  Recent Labs Lab 03/28/16 1546 03/29/16 0345 03/30/16 0359 03/31/16 0315  WBC 12.5* 9.3 12.6* 12.6*  NEUTROABS 9.6*  --   --   --   HGB 12.5 11.5* 11.8* 11.6*  HCT 38.2 35.6* 36.2 36.4  MCV 105.5* 104.7* 104.3* 105.5*  PLT 254 228 254 292   Basic Metabolic Panel:  Recent Labs Lab 03/28/16 1546 03/29/16 0345 03/30/16 0359 03/31/16 0315  NA 142 142 141 140  K 4.1 3.8 3.9 3.5  CL 105 105 106 102  CO2 29 32 28 30  GLUCOSE 93 98 105* 124*  BUN 28* 25* 21* 18  CREATININE 1.00 0.85 0.75 0.77  CALCIUM 9.6 8.9 9.0 9.2   Liver Function Tests:  Recent Labs Lab 03/28/16 1545  AST 25  ALT 25  ALKPHOS 87  BILITOT 0.7  PROT 6.9  ALBUMIN 2.9*   Urine analysis:    Component Value Date/Time   COLORURINE YELLOW 03/29/2016 0435   APPEARANCEUR CLEAR 03/29/2016 0435   LABSPEC 1.027 03/29/2016 0435   PHURINE 5.5 03/29/2016 0435   GLUCOSEU NEGATIVE 03/29/2016 0435   HGBUR NEGATIVE 03/29/2016 0435   BILIRUBINUR NEGATIVE 03/29/2016 0435   KETONESUR 15 (A) 03/29/2016 0435   PROTEINUR 100 (A) 03/29/2016 0435   UROBILINOGEN 1.0 04/01/2014 0203   NITRITE NEGATIVE 03/29/2016 0435   LEUKOCYTESUR NEGATIVE 03/29/2016 0435   Recent Results (from the past 240 hour(s))  MRSA PCR Screening     Status: None   Collection Time: 03/28/16  7:00 PM  Result Value Ref Range Status   MRSA by PCR NEGATIVE NEGATIVE Final  Culture, blood (routine x 2) Call MD if unable to obtain prior to antibiotics being given     Status: None (Preliminary result)   Collection Time: 03/28/16  7:50 PM  Result Value Ref Range Status   Specimen Description BLOOD LEFT ARM  Final   Special Requests BOTTLES DRAWN AEROBIC AND ANAEROBIC 10CC  Final   Culture   Final    NO GROWTH < 24 HOURS Performed at [facility]    Report Status PENDING  Incomplete  Culture, blood (routine x 2) Call MD if unable to obtain prior to antibiotics being given     Status: None (Preliminary result)   Collection  Time: 03/28/16  7:51 PM  Result Value Ref Range Status   Specimen Description BLOOD LEFT ARM  Final   Special Requests BOTTLES DRAWN AEROBIC AND ANAEROBIC  5CC  Final   Culture   Final    NO GROWTH < 24 HOURS Performed at [facility]    Report Status PENDING  Incomplete      Radiology Studies: Ct Chest Wo Contrast  Result Date: 03/30/2016 CLINICAL DATA:  82 year old female with 1 week of progressively worsening cough and dyspnea EXAM: CT CHEST WITHOUT CONTRAST  TECHNIQUE: Multidetector CT imaging of the chest was performed following the standard protocol without IV contrast. COMPARISON:  Chest x-ray 03/28/2016 FINDINGS: Cardiovascular: Limited evaluation in the absence of intravenous contrast. Atherosclerotic calcifications present in the aorta. No evidence of aneurysm. Mild cardiomegaly with left ventricular dilatation. No pericardial effusion. Calcification of the mitral valve annulus. Calcifications noted along the course of the coronary arteries. Mediastinum/Nodes: Unremarkable CT appearance of the thyroid gland. No suspicious mediastinal or hilar adenopathy. No soft tissue mediastinal mass. The thoracic esophagus is unremarkable. Lungs/Pleura: Small bilateral pleural effusions. Associated lower lobe atelectasis. Evaluation for small pulmonary nodules limited by extensive respiratory motion artifact. Masslike opacity along the posterior pleural surface in the superior segment of the left lower lobe measuring approximately 3.5 x 3.3 cm. No pulmonary edema. Upper Abdomen: No acute findings. Musculoskeletal: No chest wall mass or suspicious bone lesions identified. Multilevel degenerative disc disease. IMPRESSION: 1. Limited study secondary to extensive motion artifact related to intractable coughing. 2. Masslike but irregular and ill-defined subpleural opacity in the superior segment of the left lower lobe may represent a region of rounded/masslike pneumonia, or a true pulmonary mass.  Pneumonia is favored. Followup PA and lateral chest X-ray is recommended in 3-4 weeks following trial of antibiotic therapy to ensure resolution and exclude underlying malignancy. 3. Small bilateral pleural effusions and associated atelectasis. 4. Cardiomegaly with left ventricular dilatation. 5. Coronary artery calcifications. 6. Multilevel degenerative disc disease. Electronically Signed   By: Malachy MoanHeath  McCullough M.D.   On: 03/30/2016 19:22      Scheduled Meds: . acetaminophen  650 mg Oral Q8H  . aspirin  81 mg Oral Daily  . ceFEPime (MAXIPIME) IV  1 g Intravenous Q12H  . Dextromethorphan-Quinidine  1 capsule Oral BID  . enoxaparin (LOVENOX) injection  40 mg Subcutaneous Q24H  . escitalopram  20 mg Oral Daily  . guaiFENesin  600 mg Oral BID  . hydrALAZINE  50 mg Oral Q8H  . irbesartan  300 mg Oral Daily  . levalbuterol  0.63 mg Nebulization TID  . levothyroxine  150 mcg Oral QAC breakfast  . mouth rinse  15 mL Mouth Rinse BID  . polyvinyl alcohol  1 drop Both Eyes BID  . vitamin B-12  1,000 mcg Oral Daily   Continuous Infusions: . nitroGLYCERIN 120 mcg/min (03/31/16 1744)     LOS: 3 days    Time spent: 20 minutes    Debbora PrestoMAGICK-Evert Wenrich, MD [facility] Pager [phone]  If 7PM-7AM, please contact night-coverage [email or web address] Password [email or web address] 03/31/2016, 5:54 PM

## 2016-03-31 NOTE — Consult Note (Signed)
Consultation Note Date: 03/31/2016   Patient Name: Kristine [name]  DOB: [birth year]  MRN: [id number]  Age / Sex: 82 y.o., female  PCP: Hennie Duos, MD Referring Physician: Theodis Blaze, MD  Reason for Consultation: Establishing goals of care and Non pain symptom management  HPI/Patient Profile: 82 y.o. female  with past medical history of depression, bilateral leg edema, HTN, CKD (stage 2), dysphagia admitted on 03/28/2016 with acute respiratory failure currently presumed to be HCAP, and hypertensive urgency. Being treated currently with IV abx and nitroglycerin drip.  Palliative care was consulted for symptom management and goals of care.   Clinical Assessment and Goals of Care: Met with patient and had followup meeting with patient's daughter in law, [name]. Patient currently resides at [facility] where she developed shortness of breath and cough requiring admission to hospital. Xray and chest CT revealed bilateral small pleural effusions, likely pneumonia, and cardiomegaly. She had an ECHO which showed EF 55-60%, LVH and grade 1 diastolic dysfunction. Ms. Killilea reports she had a good quality of life at SNF. She is not ambulatory but moves herself around in her wheelchair. [name] tells me that her depression resulted from her not being able to walk, but the depression is well controlled now with medications. She used to walk 3 miles per day back and forth to work. However, she developed severe lower extremity edema (unknow cause) and lost the ability to walk. She enjoys playing bingo and visiting with other residents. She has one son who lives locally, and her daughter in law who participates in her care and decision making. Patient is mentally intact. We discussed roles of palliative care. Patient was having no complaints other than not being able to sleep last night. She does have difficulty swallowing and her  po intake has decreased significantly over the past few months. Denies SOB, chest pain or other discomforts. Feels symptoms are currently being controlled. Goals of care for patient are to get well enough to return to [facility]. She desires to remain full code, however, she would not want to be kept alive long term by artificial means including ventilator and feeding tube. Per [name], she has had conversations with her about code status and [name] feels she should change status to DNR. [name] has good insight into outcomes of CPR but patient remains resistant at this point. In private conversation with [name], she revealed that patient's husband died when their son was 57 and thus patient and son, Rush Landmark, are very connected and determined to maintain code status. [name] notes that Rush Landmark has difficulty with these conversation due to the loss of his father at young age.   NEXT OF KIN- son, Rush Landmark    SUMMARY OF RECOMMENDATIONS -Symptom wise patient appears comfortable, continue current management, consider Trazadone for sleep -Continue full scope of care for now -Plan for follow up goals meeting on Saturday with patient's son present -No long term ventilation, no PEG    Code Status/Advance Care Planning:  Full code  Symptom Management:   See above  Palliative Prophylaxis:   Aspiration, Bowel Regimen, Delirium Protocol and Frequent Pain Assessment  Additional Recommendations (Limitations, Scope, Preferences):  No Artificial Feeding and No Tracheostomy  Psycho-social/Spiritual:   Desire for further Chaplaincy support:No Additional Recommendations:  Prognosis:  Unable to determine - prognosis is poor given patient is 82 yo with HCAP, and having some cardiac issues (noted runs of VTACH during my assessment, BP maintenance difficulties)   Discharge Planning: To Be Determined      Primary Diagnoses: Present on Admission: . HCAP (healthcare-associated pneumonia) . Hypertensive  urgency   I have reviewed the medical record, interviewed the patient and family, and examined the patient. The following aspects are pertinent.  Past Medical History:  Diagnosis Date  . Bilateral leg edema   . Hyperlipidemia   . Hypertension    Social History   Social History  . Marital status: Widowed    Spouse name: N/A  . Number of children: N/A  . Years of education: N/A   Social History Main Topics  . Smoking status: Never Smoker  . Smokeless tobacco: Never Used  . Alcohol use No  . Drug use: No  . Sexual activity: Not Asked   Other Topics Concern  . None   Social History Narrative  . None   Family History  Problem Relation Age of Onset  . Family history unknown: Yes   Scheduled Meds: . acetaminophen  650 mg Oral Q8H  . aspirin  81 mg Oral Daily  . ceFEPime (MAXIPIME) IV  1 g Intravenous Q12H  . Dextromethorphan-Quinidine  1 capsule Oral BID  . enoxaparin (LOVENOX) injection  40 mg Subcutaneous Q24H  . escitalopram  20 mg Oral Daily  . guaiFENesin  600 mg Oral BID  . hydrALAZINE  50 mg Oral Q8H  . irbesartan  300 mg Oral Daily  . levalbuterol  0.63 mg Nebulization TID  . levothyroxine  150 mcg Oral QAC breakfast  . mouth rinse  15 mL Mouth Rinse BID  . polyvinyl alcohol  1 drop Both Eyes BID  . vancomycin  750 mg Intravenous Q12H  . vitamin B-12  1,000 mcg Oral Daily   Continuous Infusions: . nitroGLYCERIN 15 mcg/min (03/31/16 1200)   PRN Meds:.guaiFENesin-dextromethorphan, hydrALAZINE, levalbuterol, lip balm, ondansetron **OR** ondansetron (ZOFRAN) IV, perflutren lipid microspheres (DEFINITY) IV suspension, traMADol Medications Prior to Admission:  Prior to Admission medications   Medication Sig Start Date End Date Taking? Authorizing Provider  acetaminophen (TYLENOL) 325 MG tablet Take 650 mg by mouth every 8 (eight) hours. Not to exceed 3G   Yes Historical Provider, MD  ALPRAZolam (XANAX) 0.25 MG tablet Take 0.25 mg by mouth every 8 (eight) hours  as needed for anxiety.   Yes Historical Provider, MD  aspirin 81 MG chewable tablet Chew 81 mg by mouth daily.   Yes Historical Provider, MD  carboxymethylcellulose (REFRESH PLUS) 0.5 % SOLN Place 1 drop into both eyes 2 (two) times daily.    Yes Historical Provider, MD  carvedilol (COREG) 25 MG tablet Take 25 mg by mouth 2 (two) times daily with a meal. For HTN , NOTIFY PROVIDER IF BP IS OVER 160 FOR TWO CONSECUTIVE READINGS   Yes Historical Provider, MD  cefTRIAXone (ROCEPHIN) 1 g injection Inject 1 g into the muscle daily. Stop date 04/01/16 02/22/16 04/01/16 Yes Historical Provider, MD  Cetylpyridinium Chloride (ANTISEPTIC ORAL RINSE MT) Use as directed 15 mLs in the mouth or throat 2 (two) times daily.  Yes Historical Provider, MD  Chlorpheniramine-DM 1-5 MG/5ML LIQD Take 15 mLs by mouth 2 (two) times daily. For seven days, 9/1 - 9/8.   Yes Historical Provider, MD  Cholecalciferol (VITAMIN D3) 50000 UNITS CAPS Take 1 capsule by mouth every 30 (thirty) days. TAKES ON THE 28TH OF EVERY MONTH   Yes Historical Provider, MD  Dextromethorphan-Quinidine (NUEDEXTA) 20-10 MG CAPS Take 1 capsule by mouth 2 (two) times daily.   Yes Historical Provider, MD  escitalopram (LEXAPRO) 20 MG tablet Take 20 mg by mouth daily.   Yes Historical Provider, MD  ipratropium-albuterol (DUONEB) 0.5-2.5 (3) MG/3ML SOLN Take 3 mLs by nebulization every 8 (eight) hours as needed. For wheezing , congestion, and dyspnea   Yes Historical Provider, MD  ipratropium-albuterol (DUONEB) 0.5-2.5 (3) MG/3ML SOLN Take 3 mLs by nebulization 3 (three) times daily. 03/25/16 03/31/16 Yes Historical Provider, MD  irbesartan (AVAPRO) 300 MG tablet Take 300 mg by mouth daily. For HTN   Yes Historical Provider, MD  levothyroxine (SYNTHROID, LEVOTHROID) 150 MCG tablet Take 150 mcg by mouth daily before breakfast.   Yes Historical Provider, MD  Nutritional Supplements (ENSURE PO) Take 237 mLs by mouth 3 (three) times daily between meals.   Yes Historical  Provider, MD  ondansetron (ZOFRAN) 4 MG tablet Take 4 mg by mouth every 6 (six) hours as needed for nausea or vomiting.   Yes Historical Provider, MD  traMADol (ULTRAM) 50 MG tablet Take 50 mg by mouth every 12 (twelve) hours as needed for moderate pain.   Yes Historical Provider, MD  vitamin B-12 (CYANOCOBALAMIN) 1000 MCG tablet Take 1,000 mcg by mouth daily.   Yes Historical Provider, MD  OXYGEN Inhale 2 L into the lungs continuous. To maintain 02 sat >90    Historical Provider, MD   Allergies  Allergen Reactions  . Amitriptyline     Weak sluggish  . Buspar [Buspirone]     Increased anxiety   . Codeine     Lethargic gitter   . Dyazide [Hydrochlorothiazide W-Triamterene]     weakness  . Macrolides And Ketolides     Rash    Review of Systems  Constitutional: Positive for activity change and appetite change.  HENT: Positive for congestion.   Respiratory: Positive for cough and shortness of breath.   Cardiovascular: Positive for chest pain and leg swelling.  Gastrointestinal: Negative.   Endocrine: Negative.   Genitourinary: Negative.   Musculoskeletal: Negative.   Allergic/Immunologic: Negative.   Neurological: Negative.   Hematological: Negative.   Psychiatric/Behavioral: Positive for sleep disturbance.       Depression     Physical Exam  Constitutional: She is oriented to person, place, and time. She appears well-developed and well-nourished. No distress.  Eyes: EOM are normal.  Cardiovascular: Normal rate and intact distal pulses.   Irregular rhythm  Pulmonary/Chest: Effort normal. No respiratory distress.  Abdominal: Soft. Bowel sounds are normal.  Musculoskeletal: She exhibits edema.  Extensive bilateral lower extremity edema  Neurological: She is alert and oriented to person, place, and time.  Somnolent at times  Skin: Skin is warm and dry. She is not diaphoretic. There is pallor.  Psychiatric: She has a normal mood and affect. Her behavior is normal. Thought  content normal.  Nursing note and vitals reviewed.   Vital Signs: BP (!) 171/51   Pulse 69   Temp 98.3 F (36.8 C) (Oral)   Resp (!) 24   Ht _0  (1.651 m)   Wt 96.2 kg (212 lb 1.3  oz)   SpO2 100%   BMI 35.29 kg/m  Pain Assessment: 0-10 POSS *See Group Information*: 1-Acceptable,Awake and alert Pain Score: 3    SpO2: SpO2: 100 % O2 Device:SpO2: 100 % O2 Flow Rate: .O2 Flow Rate (L/min): 2 L/min  IO: Intake/output summary:  Intake/Output Summary (Last 24 hours) at 03/31/16 1327 Last data filed at 03/31/16 1200  Gross per 24 hour  Intake            327.4 ml  Output             1950 ml  Net          -1622.6 ml    LBM: Last BM Date:  (Pt unsure) Baseline Weight: Weight: 99.3 kg (219 lb) Most recent weight: Weight: 96.2 kg (212 lb 1.3 oz)     Palliative Assessment/Data: 30%   Flowsheet Rows   Flowsheet Row Most Recent Value  Intake Tab  Referral Department  Hospitalist  Unit at Time of Referral  ICU  Palliative Care Primary Diagnosis  Pulmonary  Date Notified  03/30/16  Palliative Care Type  New Palliative care  Reason for referral  Clarify Goals of Care  Date of Admission  03/28/16  Date first seen by Palliative Care  03/31/16  # of days Palliative referral response time  1 Day(s)  # of days IP prior to Palliative referral  2  Clinical Assessment  Psychosocial & Spiritual Assessment  Palliative Care Outcomes      Time In: 0900 Time Out: 1030 Time Total: 90 minutes Greater than 50%  of this time was spent counseling and coordinating care related to the above assessment and plan.  Signed by: Mariana Kaufman, AGNP-C Palliative Medicine  Please contact Palliative Medicine Team phone at [phone] for questions and concerns.  For individual provider: See Shea Evans

## 2016-04-01 LAB — BASIC METABOLIC PANEL
ANION GAP: 6 (ref 5–15)
BUN: 18 mg/dL (ref 6–20)
CHLORIDE: 98 mmol/L — AB (ref 101–111)
CO2: 29 mmol/L (ref 22–32)
Calcium: 8.8 mg/dL — ABNORMAL LOW (ref 8.9–10.3)
Creatinine, Ser: 0.72 mg/dL (ref 0.44–1.00)
Glucose, Bld: 131 mg/dL — ABNORMAL HIGH (ref 65–99)
POTASSIUM: 3.5 mmol/L (ref 3.5–5.1)
SODIUM: 133 mmol/L — AB (ref 135–145)

## 2016-04-01 LAB — CBC
HCT: 31.9 % — ABNORMAL LOW (ref 36.0–46.0)
HEMOGLOBIN: 10.6 g/dL — AB (ref 12.0–15.0)
MCH: 34.4 pg — AB (ref 26.0–34.0)
MCHC: 33.2 g/dL (ref 30.0–36.0)
MCV: 103.6 fL — ABNORMAL HIGH (ref 78.0–100.0)
PLATELETS: 264 10*3/uL (ref 150–400)
RBC: 3.08 MIL/uL — ABNORMAL LOW (ref 3.87–5.11)
RDW: 14.2 % (ref 11.5–15.5)
WBC: 14.6 10*3/uL — ABNORMAL HIGH (ref 4.0–10.5)

## 2016-04-01 LAB — TSH: TSH: 1.795 u[IU]/mL (ref 0.350–4.500)

## 2016-04-01 MED ORDER — FUROSEMIDE 10 MG/ML IJ SOLN
40.0000 mg | Freq: Once | INTRAMUSCULAR | Status: AC
  Administered 2016-04-01: 40 mg via INTRAVENOUS
  Filled 2016-04-01: qty 4

## 2016-04-01 MED ORDER — NITROGLYCERIN IN D5W 200-5 MCG/ML-% IV SOLN
INTRAVENOUS | Status: AC
  Administered 2016-04-01: 150 ug/min via INTRAVENOUS
  Filled 2016-04-01: qty 250

## 2016-04-01 MED ORDER — AMLODIPINE BESYLATE 10 MG PO TABS: 10.0000 mg | ORAL_TABLET | Freq: Every day | ORAL | Status: DC

## 2016-04-01 MED ORDER — NICARDIPINE HCL IN NACL 20-0.86 MG/200ML-% IV SOLN
3.0000 mg/h | INTRAVENOUS | Status: DC
  Administered 2016-04-01: 5 mg/h via INTRAVENOUS
  Administered 2016-04-01: 7.5 mg/h via INTRAVENOUS
  Administered 2016-04-01: 12.5 mg/h via INTRAVENOUS
  Administered 2016-04-01: 5 mg/h via INTRAVENOUS
  Administered 2016-04-02: 7.5 mg/h via INTRAVENOUS
  Administered 2016-04-02: 10 mg/h via INTRAVENOUS
  Filled 2016-04-01 (×6): qty 200

## 2016-04-01 MED ORDER — HYDRALAZINE HCL 50 MG PO TABS
50.0000 mg | ORAL_TABLET | Freq: Four times a day (QID) | ORAL | Status: DC
  Administered 2016-04-01 – 2016-04-06 (×21): 50 mg via ORAL
  Filled 2016-04-01 (×21): qty 1

## 2016-04-01 NOTE — Progress Notes (Addendum)
Patient ID: Kristine [name], female   DOB: [birth year], 82 y.o.   MRN: [id number]    PROGRESS NOTE    Kristine [name]  WUJ:[id number] DOB: [birth year] DOA: 03/28/2016  PCP: Merrilee Seashore, MD   Brief Narrative:  82 y.o. female with HTN, depression, who presented from [facility] with main concern of one week duration of progressively worsening cough and dyspnea, initially present with exertion and has progressed to dyspnea at rest. Cough has been mixed, non productive and occasionally production of clear sputum, pt does report difficulty bringing sputum up, saying she feels too week to cough anything up. Pt also reports subjective fevers, chills, malaise, poor oral intake. She had some metabolic encephalopathy as well as bradycardia for which her beta blocker was stopped. Her cough and mental status have improved, but still on 2L [place] oxygen and nitroglycerin drip was started 9/7 due to hypertension.   Assessment & Plan:  Active Problems:   Acute respiratory failure due to HCAP (healthcare-associated pneumonia) - RLL and RUL PNA, also concern for aspiration PNA.  - LLL PNA per CT chest  - note white count is rising but clinically she is stable - continue maxipime day #5, stop vancomycin 9/7 - continue BD's scheduled and as needed, prn antitussives; being followed by RT - CT chest notable for mass-like, irregular and ill-defined subpleural opacity in the superior segment of the left lower lobe may represent a region of rounded/masslike pneumonia, or a true pulmonary mass. Pneumonia favored.  - Followup PA and lateral chest X-ray is recommended in 3-4 weeks following trial of antibiotic therapy to ensure resolution and exclude underlying malignancy - palliative care consulted for further GOC discussion     Hypertensive urgency - at home on Coreg and Avapro, coreg was held due to bradycardia  - added hydralazine scheduled, increased to Q6 hours 9/8 - adding lasix did not help much either -  placed on nitro drip 9/7, continue to titrate - consider add oral nitrate and/or amlodipine next  PM Addendum : Discussed with RN, BP still uncontrolled on max nitro drip. Change drip to nicardipine.     Acute metabolic encephalopathy - resolved today - continue hold Xanax and other sedating meds - keep in SDU    Hypothyroidism  - continue synthroid per home medical regimen  - TSH normal  DVT prophylaxis: lovenox SQ Code Status: Full  Family Communication: Will discuss with son Annette Stable this PM  Disposition Plan: SNF once medically stable, keep in SDU for now  Consultants:   PCT  Procedures:   None  Antimicrobials:   Vancomycin 9/4 --> 9/7  Maxipime 9/4 -->   Subjective: Awake and alert, no specific complaints. Denies pain, nausea, says she still has a slight cough but that it's better. She wants to get out of bed to the chair today.  Objective: Vitals:   04/01/16 0545 04/01/16 0600 04/01/16 0615 04/01/16 0754  BP: (!) 171/40 (!) 152/52 (!) 183/100   Pulse: 74 74 73 77  Resp: (!) 28 20 (!) 26 20  Temp:      TempSrc:      SpO2: 92% 91% 93% 94%  Weight:      Height:        Intake/Output Summary (Last 24 hours) at 04/01/16 0755 Last data filed at 04/01/16 0700  Gross per 24 hour  Intake           851.96 ml  Output  1550 ml  Net          -698.04 ml   Filed Weights   03/30/16 0500 03/31/16 0500 04/01/16 0500  Weight: 98.2 kg (216 lb 7.9 oz) 96.2 kg (212 lb 1.3 oz) 101.2 kg (223 lb 1.7 oz)    Examination:  General exam: Appears alert and comfortable, not coughing today Respiratory system: Diminished breath sounds at bases with rhonchi and crackles bilateral  Cardiovascular system: regular rhythm and rate, no rubs, gallops or clicks. No pedal edema. Gastrointestinal system: Abdomen is nondistended, soft and nontender. No organomegaly or masses felt. Central nervous system: moving all 4 extremities, alert and oriented x3  Data Reviewed: I have  personally reviewed following labs and imaging studies  CBC:  Recent Labs Lab 03/28/16 1546 03/29/16 0345 03/30/16 0359 03/31/16 0315 04/01/16 0349  WBC 12.5* 9.3 12.6* 12.6* 14.6*  NEUTROABS 9.6*  --   --   --   --   HGB 12.5 11.5* 11.8* 11.6* 10.6*  HCT 38.2 35.6* 36.2 36.4 31.9*  MCV 105.5* 104.7* 104.3* 105.5* 103.6*  PLT 254 228 254 292 264   Basic Metabolic Panel:  Recent Labs Lab 03/28/16 1546 03/29/16 0345 03/30/16 0359 03/31/16 0315 04/01/16 0349  NA 142 142 141 140 133*  K 4.1 3.8 3.9 3.5 3.5  CL 105 105 106 102 98*  CO2 29 32 28 30 29   GLUCOSE 93 98 105* 124* 131*  BUN 28* 25* 21* 18 18  CREATININE 1.00 0.85 0.75 0.77 0.72  CALCIUM 9.6 8.9 9.0 9.2 8.8*   Liver Function Tests:  Recent Labs Lab 03/28/16 1545  AST 25  ALT 25  ALKPHOS 87  BILITOT 0.7  PROT 6.9  ALBUMIN 2.9*   Urine analysis:    Component Value Date/Time   COLORURINE YELLOW 03/29/2016 0435   APPEARANCEUR CLEAR 03/29/2016 0435   LABSPEC 1.027 03/29/2016 0435   PHURINE 5.5 03/29/2016 0435   GLUCOSEU NEGATIVE 03/29/2016 0435   HGBUR NEGATIVE 03/29/2016 0435   BILIRUBINUR NEGATIVE 03/29/2016 0435   KETONESUR 15 (A) 03/29/2016 0435   PROTEINUR 100 (A) 03/29/2016 0435   UROBILINOGEN 1.0 04/01/2014 0203   NITRITE NEGATIVE 03/29/2016 0435   LEUKOCYTESUR NEGATIVE 03/29/2016 0435   Recent Results (from the past 240 hour(s))  MRSA PCR Screening     Status: None   Collection Time: 03/28/16  7:00 PM  Result Value Ref Range Status   MRSA by PCR NEGATIVE NEGATIVE Final  Culture, blood (routine x 2) Call MD if unable to obtain prior to antibiotics being given     Status: None (Preliminary result)   Collection Time: 03/28/16  7:50 PM  Result Value Ref Range Status   Specimen Description BLOOD LEFT ARM  Final   Special Requests BOTTLES DRAWN AEROBIC AND ANAEROBIC 10CC  Final   Culture   Final    NO GROWTH < 24 HOURS [facility]    Report Status PENDING  Incomplete   Culture, blood (routine x 2) Call MD if unable to obtain prior to antibiotics being given     Status: None (Preliminary result)   Collection Time: 03/28/16  7:51 PM  Result Value Ref Range Status   Specimen Description BLOOD LEFT ARM  Final   Special Requests BOTTLES DRAWN AEROBIC AND ANAEROBIC  5CC  Final   Culture   Final    NO GROWTH < 24 HOURS [facility]formed at [facility]    Report Status PENDING  Incomplete  Radiology Studies: Ct Chest Wo Contrast  Result Date: 03/30/2016 CLINICAL DATA:  82 year old female with 1 week of progressively worsening cough and dyspnea EXAM: CT CHEST WITHOUT CONTRAST TECHNIQUE: Multidetector CT imaging of the chest was performed following the standard protocol without IV contrast. COMPARISON:  Chest x-ray 03/28/2016 FINDINGS: Cardiovascular: Limited evaluation in the absence of intravenous contrast. Atherosclerotic calcifications present in the aorta. No evidence of aneurysm. Mild cardiomegaly with left ventricular dilatation. No pericardial effusion. Calcification of the mitral valve annulus. Calcifications noted along the course of the coronary arteries. Mediastinum/Nodes: Unremarkable CT appearance of the thyroid gland. No suspicious mediastinal or hilar adenopathy. No soft tissue mediastinal mass. The thoracic esophagus is unremarkable. Lungs/Pleura: Small bilateral pleural effusions. Associated lower lobe atelectasis. Evaluation for small pulmonary nodules limited by extensive respiratory motion artifact. Masslike opacity along the posterior pleural surface in the superior segment of the left lower lobe measuring approximately 3.5 x 3.3 cm. No pulmonary edema. Upper Abdomen: No acute findings. Musculoskeletal: No chest wall mass or suspicious bone lesions identified. Multilevel degenerative disc disease. IMPRESSION: 1. Limited study secondary to extensive motion artifact related to intractable coughing. 2. Masslike but irregular and ill-defined subpleural  opacity in the superior segment of the left lower lobe may represent a region of rounded/masslike pneumonia, or a true pulmonary mass. Pneumonia is favored. Followup PA and lateral chest X-ray is recommended in 3-4 weeks following trial of antibiotic therapy to ensure resolution and exclude underlying malignancy. 3. Small bilateral pleural effusions and associated atelectasis. 4. Cardiomegaly with left ventricular dilatation. 5. Coronary artery calcifications. 6. Multilevel degenerative disc disease. Electronically Signed   By: Malachy Moan M.D.   On: 03/30/2016 19:22      Scheduled Meds: . acetaminophen  650 mg Oral Q8H  . aspirin  81 mg Oral Daily  . ceFEPime (MAXIPIME) IV  1 g Intravenous Q12H  . Dextromethorphan-Quinidine  1 capsule Oral BID  . enoxaparin (LOVENOX) injection  40 mg Subcutaneous Q24H  . escitalopram  20 mg Oral Daily  . guaiFENesin  600 mg Oral BID  . hydrALAZINE  50 mg Oral Q6H  . irbesartan  300 mg Oral Daily  . levalbuterol  0.63 mg Nebulization TID  . levothyroxine  150 mcg Oral QAC breakfast  . mouth rinse  15 mL Mouth Rinse BID  . polyvinyl alcohol  1 drop Both Eyes BID  . vitamin B-12  1,000 mcg Oral Daily   Continuous Infusions: . nitroGLYCERIN 200 mcg/min (04/01/16 1610)     LOS: 4 days    Time spent: 21 minutes    Iyauna Sing Vergie Living, MD [facility] Pager [phone]  If 7PM-7AM, please contact night-coverage [email or web address] Password [email or web address] 04/01/2016, 7:55 AM

## 2016-04-01 NOTE — Progress Notes (Signed)
Cardene gtt titrated often d/t varied BP reads from BP cuff size and placement. After close monitoring (q 15 minute vitals, continual mental status assessments, etc.) and discussion with charge RN Verl BlalockHans Johnson, it is felt that pt's most accurate BP readings are from a regular sized cuff on the rt. forearm. Will continue to monitor pt and keep Cardene infusing at a rate to maintain a goal systolic BP at 120-140 as per MD order.

## 2016-04-02 LAB — BLOOD GAS, ARTERIAL
Acid-Base Excess: 2.1 mmol/L — ABNORMAL HIGH (ref 0.0–2.0)
Bicarbonate: 28.6 mmol/L — ABNORMAL HIGH (ref 20.0–28.0)
Drawn by: 11249
O2 Content: 3 L/min
O2 Saturation: 95.1 %
Patient temperature: 98.8
pCO2 arterial: 56.5 mmHg — ABNORMAL HIGH (ref 32.0–48.0)
pH, Arterial: 7.325 — ABNORMAL LOW (ref 7.350–7.450)
pO2, Arterial: 82.4 mmHg — ABNORMAL LOW (ref 83.0–108.0)

## 2016-04-02 LAB — CBC
HEMATOCRIT: 34.3 % — AB (ref 36.0–46.0)
Hemoglobin: 11.5 g/dL — ABNORMAL LOW (ref 12.0–15.0)
MCH: 34.1 pg — AB (ref 26.0–34.0)
MCHC: 33.5 g/dL (ref 30.0–36.0)
MCV: 101.8 fL — AB (ref 78.0–100.0)
PLATELETS: 255 10*3/uL (ref 150–400)
RBC: 3.37 MIL/uL — ABNORMAL LOW (ref 3.87–5.11)
RDW: 14 % (ref 11.5–15.5)
WBC: 14.3 10*3/uL — AB (ref 4.0–10.5)

## 2016-04-02 LAB — BASIC METABOLIC PANEL
Anion gap: 7 (ref 5–15)
BUN: 16 mg/dL (ref 4–21)
BUN: 16 mg/dL (ref 6–20)
CHLORIDE: 96 mmol/L — AB (ref 101–111)
CO2: 28 mmol/L (ref 22–32)
CREATININE: 0.8 mg/dL (ref 0.5–1.1)
CREATININE: 0.82 mg/dL (ref 0.44–1.00)
Calcium: 8.7 mg/dL — ABNORMAL LOW (ref 8.9–10.3)
GFR calc Af Amer: >60 mL/min (ref >60)
GFR calc non Af Amer: >60 mL/min (ref >60)
GLUCOSE: 143 mg/dL
GLUCOSE: 143 mg/dL — AB (ref 65–99)
POTASSIUM: 3.5 mmol/L (ref 3.5–5.1)
Potassium: 3.5 mmol/L (ref 3.4–5.3)
Sodium: 131 mmol/L — AB (ref 137–147)
Sodium: 131 mmol/L — ABNORMAL LOW (ref 135–145)

## 2016-04-02 LAB — CULTURE, BLOOD (ROUTINE X 2)
CULTURE: NO GROWTH
Culture: NO GROWTH

## 2016-04-02 LAB — CBC AND DIFFERENTIAL
HCT: 34 % — AB (ref 36–46)
Hemoglobin: 11.5 g/dL — AB (ref 12.0–16.0)
PLATELETS: 255 10*3/uL (ref 150–399)
WBC: 14.3 10*3/mL

## 2016-04-02 LAB — GLUCOSE, CAPILLARY: Glucose-Capillary: 129 mg/dL — ABNORMAL HIGH (ref 65–99)

## 2016-04-02 MED ORDER — AMPICILLIN-SULBACTAM SODIUM 3 (2-1) G IJ SOLR
3.0000 g | Freq: Four times a day (QID) | INTRAMUSCULAR | Status: DC
  Administered 2016-04-02 – 2016-04-06 (×17): 3 g via INTRAVENOUS
  Filled 2016-04-02 (×21): qty 3

## 2016-04-02 MED ORDER — LEVALBUTEROL HCL 0.63 MG/3ML IN NEBU
0.6300 mg | INHALATION_SOLUTION | Freq: Two times a day (BID) | RESPIRATORY_TRACT | Status: DC
  Administered 2016-04-02 – 2016-04-04 (×4): 0.63 mg via RESPIRATORY_TRACT
  Filled 2016-04-02 (×3): qty 3

## 2016-04-02 MED ORDER — CARVEDILOL 12.5 MG PO TABS
12.5000 mg | ORAL_TABLET | Freq: Two times a day (BID) | ORAL | Status: DC
  Administered 2016-04-03 – 2016-04-06 (×8): 12.5 mg via ORAL
  Filled 2016-04-02 (×8): qty 1

## 2016-04-02 MED ORDER — NICARDIPINE HCL IN NACL 40-0.83 MG/200ML-% IV SOLN
3.0000 mg/h | INTRAVENOUS | Status: DC
  Administered 2016-04-02: 12.5 mg/h via INTRAVENOUS
  Administered 2016-04-02: 10 mg/h via INTRAVENOUS
  Administered 2016-04-02: 12.5 mg/h via INTRAVENOUS
  Filled 2016-04-02 (×3): qty 200

## 2016-04-02 MED ORDER — CARVEDILOL 12.5 MG PO TABS
25.0000 mg | ORAL_TABLET | Freq: Two times a day (BID) | ORAL | Status: DC
  Administered 2016-04-02: 25 mg via ORAL
  Filled 2016-04-02: qty 2

## 2016-04-02 NOTE — Progress Notes (Signed)
Cut cardene gtt off at 1634.  Pt remains bradycardic and hypotensive.  Informed Dr Kirby CriglerIkramullah.  See orders.  Erick Blinksuchman, Julieanna Geraci D, RN

## 2016-04-02 NOTE — Progress Notes (Signed)
Patient ID: Kristine [name], female   DOB: May 08, 1924, 82 y.o.   MRN: [id number]    PROGRESS NOTE    Kristine [name]  [id number] DOB: May 08, 1924 DOA: 03/28/2016  PCP: Merrilee SeashoreAnne Alexander, MD   Brief Narrative:  82 y.o. female with HTN, depression, who presented from [facility] with main concern of one week duration of progressively worsening cough and dyspnea, initially present with exertion and has progressed to dyspnea at rest. Cough has been mixed, non productive and occasionally production of clear sputum, patient does report difficulty bringing sputum up, saying she feels too week to cough anything up. Pt also reports subjective fevers, chills, malaise, poor oral intake. She had some metabolic encephalopathy as well as bradycardia for which her beta blocker was stopped. Her cough and mental status have improved, but still on 2L [place] oxygen and nitroglycerin drip was started 9/7 due to hypertension, changed to cardene drip on 9/8 due to lack of effect and Cardene did improve blood pressures. On 9/9 we started her back on home Coreg since her heart rate has improved.  Assessment & Plan:  Active Problems:   Acute respiratory failure due to HCAP (healthcare-associated pneumonia) - RLL and RUL PNA, also concern for aspiration PNA.  - LLL PNA per CT chest  - note white count is rising but clinically she is stable - change maxipime day #6 to Unasyn for aspiration PNA, stop vancomycin 9/7 - continue BD's scheduled and as needed, prn antitussives; being followed by RT - CT chest notable for mass-like, irregular and ill-defined subpleural opacity in the superior segment of the left lower lobe may represent a region of rounded/masslike pneumonia, or a true pulmonary mass. Pneumonia favored.  - Followup PA and lateral chest X-ray is recommended in 3-4 weeks following trial of antibiotic therapy to ensure resolution and exclude underlying malignancy - palliative care consulted for further GOC discussion      Hypertensive urgency - at home on Coreg and Avapro, coreg was held due to bradycardia  - added hydralazine scheduled, increased to Q6 hours 9/8 - adding lasix did not help much either - placed on nitro drip 9/7, changed to Cardene 9/8 with better response - today 9/9 add Carvedilol and will try to see if we can wean Cardene    Acute metabolic encephalopathy - resolved 9/8 - continue hold Xanax and other sedating meds - keep in SDU today due to elevated BPs    Hypothyroidism  - continue synthroid per home medical regimen  - TSH normal  DVT prophylaxis: lovenox SQ Code Status: Full  Family Communication: Son OptometristBill  Disposition Plan: SNF once medically stable, keep in SDU for now. Likely will be medically ready in 48 hours.  Consultants:   PCT  Procedures:   None  Antimicrobials:   Vancomycin 9/4 --> 9/7  Maxipime 9/4 -->9/8  UnaSyn 9/9 -->  Subjective: Awake and alert, no specific complaints. Denies pain, nausea, says she has no cough today. Had some low UOP overnight and received lasix with good response.  Objective: Vitals:   04/02/16 0600 04/02/16 0618 04/02/16 0645 04/02/16 0700  BP: (!) 151/49 (!) 158/50 (!) 158/50 (!) 152/46  Pulse: 71 72 70   Resp: (!) 22 (!) 26 (!) 25 (!) 21  Temp:      TempSrc:      SpO2: 94% 94% 95%   Weight:      Height:        Intake/Output Summary (Last 24 hours) at 04/02/16  4098 Last data filed at 04/02/16 0700  Gross per 24 hour  Intake          1792.19 ml  Output             1250 ml  Net           542.19 ml   Filed Weights   03/31/16 0500 04/01/16 0500 04/02/16 0420  Weight: 96.2 kg (212 lb 1.3 oz) 101.2 kg (223 lb 1.7 oz) 99.6 kg (219 lb 9.3 oz)    Examination: General exam: Sleeping but arousable, and is alert and comfortable, not coughing today Respiratory system: Diminished breath sounds anteriorly with minimal rhonchi and crackles bilateral  Cardiovascular system: regular rhythm and rate, no rubs, gallops  or clicks. No pedal edema. Gastrointestinal system: Abdomen is nondistended, soft and nontender. No organomegaly or masses felt. Central nervous system: moving all 4 extremities, alert and oriented x3  Data Reviewed: I have personally reviewed following labs and imaging studies  CBC:  Recent Labs Lab 03/28/16 1546 03/29/16 0345 03/30/16 0359 03/31/16 0315 04/01/16 0349 04/02/16 0646  WBC 12.5* 9.3 12.6* 12.6* 14.6* 14.3*  NEUTROABS 9.6*  --   --   --   --   --   HGB 12.5 11.5* 11.8* 11.6* 10.6* 11.5*  HCT 38.2 35.6* 36.2 36.4 31.9* 34.3*  MCV 105.5* 104.7* 104.3* 105.5* 103.6* 101.8*  PLT 254 228 254 292 264 255   Basic Metabolic Panel:  Recent Labs Lab 03/29/16 0345 03/30/16 0359 03/31/16 0315 04/01/16 0349 04/02/16 0646  NA 142 141 140 133* 131*  K 3.8 3.9 3.5 3.5 3.5  CL 105 106 102 98* 96*  CO2 32 28 30 29 28   GLUCOSE 98 105* 124* 131* 143*  BUN 25* 21* 18 18 16   CREATININE 0.85 0.75 0.77 0.72 0.82  CALCIUM 8.9 9.0 9.2 8.8* 8.7*   Liver Function Tests:  Recent Labs Lab 03/28/16 1545  AST 25  ALT 25  ALKPHOS 87  BILITOT 0.7  PROT 6.9  ALBUMIN 2.9*   Urine analysis:    Component Value Date/Time   COLORURINE YELLOW 03/29/2016 0435   APPEARANCEUR CLEAR 03/29/2016 0435   LABSPEC 1.027 03/29/2016 0435   PHURINE 5.5 03/29/2016 0435   GLUCOSEU NEGATIVE 03/29/2016 0435   HGBUR NEGATIVE 03/29/2016 0435   BILIRUBINUR NEGATIVE 03/29/2016 0435   KETONESUR 15 (A) 03/29/2016 0435   PROTEINUR 100 (A) 03/29/2016 0435   UROBILINOGEN 1.0 04/01/2014 0203   NITRITE NEGATIVE 03/29/2016 0435   LEUKOCYTESUR NEGATIVE 03/29/2016 0435   Recent Results (from the past 240 hour(s))  MRSA PCR Screening     Status: None   Collection Time: 03/28/16  7:00 PM  Result Value Ref Range Status   MRSA by PCR NEGATIVE NEGATIVE Final  Culture, blood (routine x 2) Call MD if unable to obtain prior to antibiotics being given     Status: None (Preliminary result)   Collection Time:  03/28/16  7:50 PM  Result Value Ref Range Status   Specimen Description BLOOD LEFT ARM  Final   Special Requests BOTTLES DRAWN AEROBIC AND ANAEROBIC 10CC  Final   Culture   Final    NO GROWTH < 24 HOURS Performed at [facility]    Report Status PENDING  Incomplete  Culture, blood (routine x 2) Call MD if unable to obtain prior to antibiotics being given     Status: None (Preliminary result)   Collection Time: 03/28/16  7:51 PM  Result Value Ref Range Status  Specimen Description BLOOD LEFT ARM  Final   Special Requests BOTTLES DRAWN AEROBIC AND ANAEROBIC  5CC  Final   Culture   Final    NO GROWTH < 24 HOURS Performed at [facility]    Report Status PENDING  Incomplete      Radiology Studies: No results found.    Scheduled Meds: . acetaminophen  650 mg Oral Q8H  . ampicillin-sulbactam (UNASYN) IV  3 g Intravenous Q6H  . aspirin  81 mg Oral Daily  . carvedilol  25 mg Oral BID WC  . Dextromethorphan-Quinidine  1 capsule Oral BID  . enoxaparin (LOVENOX) injection  40 mg Subcutaneous Q24H  . escitalopram  20 mg Oral Daily  . guaiFENesin  600 mg Oral BID  . hydrALAZINE  50 mg Oral Q6H  . irbesartan  300 mg Oral Daily  . levalbuterol  0.63 mg Nebulization TID  . levothyroxine  150 mcg Oral QAC breakfast  . mouth rinse  15 mL Mouth Rinse BID  . polyvinyl alcohol  1 drop Both Eyes BID  . vitamin B-12  1,000 mcg Oral Daily   Continuous Infusions: . niCARDipine 12.5 mg/hr (04/02/16 0627)     LOS: 5 days    Time spent: 24 minutes    Mir Vergie Living, MD [facility] Pager [phone]  If 7PM-7AM, please contact night-coverage [email or web address] Password [facility] 04/02/2016, 7:38 AM

## 2016-04-02 NOTE — Progress Notes (Signed)
On initial assessment at 2000, patient drowsy but oriented x4.  At 2200, RN entered room to administered PO medications and noted change in mental status.  Patient did not respond to voice, and would only briefly open eyes to pain. Would not follow commands. BP 143/35, HR 61, RR 26, Sp02 96% on 3L [place], and CBG 129.  Pupils reactive but unequal, L 4mm, R 2mm.   Traid NP paged.   At 2220, patient became responsive to voice, oriented x4.  Grip present and equal.  Moving all extremities and following commands.  Stat ABG ordered. Will continue to monitor.

## 2016-04-03 LAB — BASIC METABOLIC PANEL
Anion gap: 7 (ref 5–15)
BUN: 21 mg/dL (ref 4–21)
BUN: 21 mg/dL — AB (ref 6–20)
CALCIUM: 8.8 mg/dL — AB (ref 8.9–10.3)
CO2: 28 mmol/L (ref 22–32)
CREATININE: 0.9 mg/dL (ref 0.5–1.1)
CREATININE: 0.91 mg/dL (ref 0.44–1.00)
Chloride: 98 mmol/L — ABNORMAL LOW (ref 101–111)
GFR calc non Af Amer: 54 mL/min — ABNORMAL LOW (ref >60)
GLUCOSE: 115 mg/dL
Glucose, Bld: 115 mg/dL — ABNORMAL HIGH (ref 65–99)
POTASSIUM: 3.8 mmol/L (ref 3.4–5.3)
Potassium: 3.8 mmol/L (ref 3.5–5.1)
SODIUM: 133 mmol/L — AB (ref 137–147)
Sodium: 133 mmol/L — ABNORMAL LOW (ref 135–145)

## 2016-04-03 LAB — CBC
HCT: 34.9 % — ABNORMAL LOW (ref 36.0–46.0)
Hemoglobin: 11.5 g/dL — ABNORMAL LOW (ref 12.0–15.0)
MCH: 33.9 pg (ref 26.0–34.0)
MCHC: 33 g/dL (ref 30.0–36.0)
MCV: 102.9 fL — ABNORMAL HIGH (ref 78.0–100.0)
PLATELETS: 281 10*3/uL (ref 150–400)
RBC: 3.39 MIL/uL — AB (ref 3.87–5.11)
RDW: 14.2 % (ref 11.5–15.5)
WBC: 15.1 10*3/uL — ABNORMAL HIGH (ref 4.0–10.5)

## 2016-04-03 LAB — CBC AND DIFFERENTIAL
HCT: 35 % — AB (ref 36–46)
HEMOGLOBIN: 11.5 g/dL — AB (ref 12.0–16.0)
Platelets: 281 10*3/uL (ref 150–399)
WBC: 15.1 10^3/mL

## 2016-04-03 NOTE — Progress Notes (Signed)
Patient ID: Kristine SalvageWilma O [name], female   DOB: 02/10/1924, 82 y.o.   MRN: [id number]    PROGRESS NOTE    Kristine [name]  [id number] DOB: 02/10/1924 DOA: 03/28/2016  PCP: Merrilee SeashoreAnne Alexander, MD   Brief Narrative:  82 y.o. female with HTN, depression, who presented from [facility] with main concern of one week duration of progressively worsening cough and dyspnea, initially present with exertion and has progressed to dyspnea at rest. Cough has been mixed, non productive and occasionally production of clear sputum, patient does report difficulty bringing sputum up, saying she feels too week to cough anything up. Pt also reports subjective fevers, chills, malaise, poor oral intake. She had some metabolic encephalopathy as well as bradycardia for which her beta blocker was stopped. Her cough and mental status have improved, but still on 2L [place] oxygen and nitroglycerin drip was started 9/7 due to hypertension, changed to cardene drip on 9/8 due to lack of effect and Cardene did improve blood pressures. On 9/9 we started her back on home Coreg since her heart rate has improved. Today 9/10 she is off of Cardene drip and Coreg will be resumed at half of home dose, we will transfer her out of step down today to telemetry due to intermittent sinus bradycardia.  Assessment & Plan:  Active Problems:   Acute respiratory failure due to HCAP (healthcare-associated pneumonia) - RLL and RUL PNA, also concern for aspiration PNA.  - LLL PNA per CT chest  - note white count is rising but clinically she is stable - change maxipime day #6 to Unasyn for aspiration PNA, stopped vancomycin 9/7 - continue BD's scheduled and as needed, prn antitussives; being followed by RT - CT chest notable for mass-like, irregular and ill-defined subpleural opacity in the superior segment of the left lower lobe may represent a region of rounded/masslike pneumonia, or a true pulmonary mass. Pneumonia favored.  - Followup PA and lateral  chest X-ray is recommended in 3-4 weeks following trial of antibiotic therapy to ensure resolution and exclude underlying malignancy - palliative care consulted for further GOC discussion     Hypertensive urgency - at home on Coreg and Avapro, coreg was held due to bradycardia  - added hydralazine PO scheduled, increased to Q6 hours 9/8 - adding lasix did not help much either - placed on nitro drip 9/7, changed to Cardene 9/8 with better response -  9/9 add Carvedilol and weaned Cardene    Acute metabolic encephalopathy - resolved 9/8, though still has periods of somnolence from time to time - continue hold Xanax and other sedating meds - keep in SDU today due to elevated BPs    Hypothyroidism  - continue synthroid per home medical regimen  - TSH normal  DVT prophylaxis: lovenox SQ Code Status: Full  Family Communication: Son OptometristBill  Disposition Plan: SNF once medically stable, to Tele today and likely SNF in next 48 hours if BP stable.  Consultants:   PCT  Procedures:   None  Antimicrobials:   Vancomycin 9/4 --> 9/7  Maxipime 9/4 -->9/8  UnaSyn 9/9 -->  Subjective: Awake and alert, no specific complaints. Denies pain, nausea, says she has no cough today. Coreg dose was held last night due to bradycardia.  Objective: Vitals:   04/03/16 0300 04/03/16 0400 04/03/16 0502 04/03/16 0600  BP: (!) 152/37 (!) 157/44 (!) 185/53 (!) 158/80  Pulse: 65 67 74 71  Resp: (!) 24 18 (!) 27 (!) 24  Temp:  97.8 F (  36.6 C)    TempSrc:  Axillary    SpO2: 93% 92% 93% 92%  Weight:      Height:        Intake/Output Summary (Last 24 hours) at 04/03/16 0724 Last data filed at 04/03/16 0600  Gross per 24 hour  Intake          1042.92 ml  Output              585 ml  Net           457.92 ml   Filed Weights   04/01/16 0500 04/02/16 0420 04/03/16 0215  Weight: 101.2 kg (223 lb 1.7 oz) 99.6 kg (219 lb 9.3 oz) 102.2 kg (225 lb 5 oz)    Examination: General exam: Resting in bed  this AM, and is alert and comfortable, not coughing today Respiratory system: Diminished breath sounds anteriorly with minimal rhonchi and crackles bilateral  Cardiovascular system: regular rhythm and rate, no rubs, gallops or clicks. No pedal edema. Gastrointestinal system: Abdomen is nondistended, soft and nontender. No organomegaly or masses felt. Central nervous system: moving all 4 extremities, alert and oriented x3  Data Reviewed: I have personally reviewed following labs and imaging studies  CBC:  Recent Labs Lab 03/28/16 1546  03/30/16 0359 03/31/16 0315 04/01/16 0349 04/02/16 0646 04/03/16 0338  WBC 12.5*  < > 12.6* 12.6* 14.6* 14.3* 15.1*  NEUTROABS 9.6*  --   --   --   --   --   --   HGB 12.5  < > 11.8* 11.6* 10.6* 11.5* 11.5*  HCT 38.2  < > 36.2 36.4 31.9* 34.3* 34.9*  MCV 105.5*  < > 104.3* 105.5* 103.6* 101.8* 102.9*  PLT 254  < > 254 292 264 255 281  < > = values in this interval not displayed. Basic Metabolic Panel:  Recent Labs Lab 03/30/16 0359 03/31/16 0315 04/01/16 0349 04/02/16 0646 04/03/16 0338  NA 141 140 133* 131* 133*  K 3.9 3.5 3.5 3.5 3.8  CL 106 102 98* 96* 98*  CO2 28 30 29 28 28   GLUCOSE 105* 124* 131* 143* 115*  BUN 21* 18 18 16  21*  CREATININE 0.75 0.77 0.72 0.82 0.91  CALCIUM 9.0 9.2 8.8* 8.7* 8.8*   Liver Function Tests:  Recent Labs Lab 03/28/16 1545  AST 25  ALT 25  ALKPHOS 87  BILITOT 0.7  PROT 6.9  ALBUMIN 2.9*   Urine analysis:    Component Value Date/Time   COLORURINE YELLOW 03/29/2016 0435   APPEARANCEUR CLEAR 03/29/2016 0435   LABSPEC 1.027 03/29/2016 0435   PHURINE 5.5 03/29/2016 0435   GLUCOSEU NEGATIVE 03/29/2016 0435   HGBUR NEGATIVE 03/29/2016 0435   BILIRUBINUR NEGATIVE 03/29/2016 0435   KETONESUR 15 (A) 03/29/2016 0435   PROTEINUR 100 (A) 03/29/2016 0435   UROBILINOGEN 1.0 04/01/2014 0203   NITRITE NEGATIVE 03/29/2016 0435   LEUKOCYTESUR NEGATIVE 03/29/2016 0435   Recent Results (from the past 240  hour(s))  MRSA PCR Screening     Status: None   Collection Time: 03/28/16  7:00 PM  Result Value Ref Range Status   MRSA by PCR NEGATIVE NEGATIVE Final  Culture, blood (routine x 2) Call MD if unable to obtain prior to antibiotics being given     Status: None (Preliminary result)   Collection Time: 03/28/16  7:50 PM  Result Value Ref Range Status   Specimen Description BLOOD LEFT ARM  Final   Special Requests BOTTLES DRAWN AEROBIC AND ANAEROBIC 10CC  Final   Culture   Final    NO GROWTH < 24 HOURS Performed at [facility]    Report Status PENDING  Incomplete  Culture, blood (routine x 2) Call MD if unable to obtain prior to antibiotics being given     Status: None (Preliminary result)   Collection Time: 03/28/16  7:51 PM  Result Value Ref Range Status   Specimen Description BLOOD LEFT ARM  Final   Special Requests BOTTLES DRAWN AEROBIC AND ANAEROBIC  5CC  Final   Culture   Final    NO GROWTH < 24 HOURS Performed at [facility]    Report Status PENDING  Incomplete      Radiology Studies: No results found.    Scheduled Meds: . acetaminophen  650 mg Oral Q8H  . ampicillin-sulbactam (UNASYN) IV  3 g Intravenous Q6H  . aspirin  81 mg Oral Daily  . carvedilol  12.5 mg Oral BID WC  . Dextromethorphan-Quinidine  1 capsule Oral BID  . enoxaparin (LOVENOX) injection  40 mg Subcutaneous Q24H  . escitalopram  20 mg Oral Daily  . guaiFENesin  600 mg Oral BID  . hydrALAZINE  50 mg Oral Q6H  . irbesartan  300 mg Oral Daily  . levalbuterol  0.63 mg Nebulization BID  . levothyroxine  150 mcg Oral QAC breakfast  . mouth rinse  15 mL Mouth Rinse BID  . polyvinyl alcohol  1 drop Both Eyes BID  . vitamin B-12  1,000 mcg Oral Daily   Continuous Infusions:     LOS: 6 days    Time spent: 23 minutes    Sarann Tregre Vergie Living, MD [facility] Pager [phone]  If 7PM-7AM, please contact night-coverage [email or web address] Password [email or web address] 04/03/2016, 7:24 AM

## 2016-04-03 NOTE — Care Management Important Message (Signed)
Important Message  Patient Details  Name: Adah SalvageWilma O Montalvo MRN: [id number] Date of Birth: [birth year]   Medicare Important Message Given:  Yes    Antony HasteBennett, Dalinda Heidt Harris, RN 04/03/2016, 4:26 PM

## 2016-04-03 NOTE — Progress Notes (Signed)
Hearing aid given to Kristine ButtersNancy Muhl (daughter in law) to take home per pt request

## 2016-04-03 NOTE — Progress Notes (Signed)
Daily Progress Note   Patient Name: Kristine [name]       Date: 04/03/2016 DOB: [birth year]  Age: 82 y.o. MRN#: [id number] Attending Physician: Mir Vergie LivingMohammed Ikramullah,* Primary Care Physician: Merrilee SeashoreAnne Alexander, MD Admit Date: 03/28/2016  Reason for Consultation/Follow-up: Establishing goals of care  Subjective:  awake alert Appears chronically ill See below  Length of Stay: 6  Current Medications: Scheduled Meds:  . acetaminophen  650 mg Oral Q8H  . ampicillin-sulbactam (UNASYN) IV  3 g Intravenous Q6H  . aspirin  81 mg Oral Daily  . carvedilol  12.5 mg Oral BID WC  . Dextromethorphan-Quinidine  1 capsule Oral BID  . enoxaparin (LOVENOX) injection  40 mg Subcutaneous Q24H  . escitalopram  20 mg Oral Daily  . guaiFENesin  600 mg Oral BID  . hydrALAZINE  50 mg Oral Q6H  . irbesartan  300 mg Oral Daily  . levalbuterol  0.63 mg Nebulization BID  . levothyroxine  150 mcg Oral QAC breakfast  . mouth rinse  15 mL Mouth Rinse BID  . polyvinyl alcohol  1 drop Both Eyes BID  . vitamin B-12  1,000 mcg Oral Daily    Continuous Infusions:    PRN Meds: guaiFENesin-dextromethorphan, hydrALAZINE, levalbuterol, lip balm, ondansetron **OR** ondansetron (ZOFRAN) IV, traMADol  Physical Exam         Weak lady resting in bed Diminished breath sounds S 1S2 Abdomen soft Trace edema Moves all 4 extremities Opens eyes, hard of hearing, follows commands  Vital Signs: BP (!) 158/80 (BP Location: Right Arm)   Pulse 71   Temp 97.8 F (36.6 C) (Axillary)   Resp (!) 24   Ht 5\' 5"  (1.651 m)   Wt 102.2 kg (225 lb 5 oz)   SpO2 92%   BMI 37.49 kg/m  SpO2: SpO2: 92 % O2 Device: O2 Device: Nasal Cannula O2 Flow Rate: O2 Flow Rate (L/min): 2 L/min  Intake/output summary:  Intake/Output Summary  (Last 24 hours) at 04/03/16 1005 Last data filed at 04/03/16 0801  Gross per 24 hour  Intake           855.42 ml  Output              585 ml  Net           270.42 ml  LBM: Last BM Date: 04/01/16 Baseline Weight: Weight: 99.3 kg (219 lb) Most recent weight: Weight: 102.2 kg (225 lb 5 oz)       Palliative Assessment/Data:    Flowsheet Rows   Flowsheet Row Most Recent Value  Intake Tab  Referral Department  Hospitalist  Unit at Time of Referral  ICU  Palliative Care Primary Diagnosis  Pulmonary  Date Notified  03/30/16  Palliative Care Type  New Palliative care  Reason for referral  Clarify Goals of Care  Date of Admission  03/28/16  Date first seen by Palliative Care  03/31/16  # of days Palliative referral response time  1 Day(s)  # of days IP prior to Palliative referral  2  Clinical Assessment  Palliative Performance Scale Score  30%  Psychosocial & Spiritual Assessment  Social Work Plan of Care  Participated in Family meeting  Palliative Care Outcomes  Palliative Care Outcomes  Provided psychosocial or spiritual support  Patient/Family wishes: Interventions discontinued/not started   PEG      Patient Active Problem List   Diagnosis Date Noted  . Dyspnea   . Advance care planning   . Goals of care, counseling/discussion   . Palliative care by specialist   . HCAP (healthcare-associated pneumonia) 03/28/2016  . Hypertensive urgency 03/28/2016  . Acute respiratory failure with hypoxia (HCC) 03/26/2016  . Osteoporosis 02/12/2016  . Hyperlipidemia 09/13/2015  . Pre-diabetes 08/02/2015  . Morbid obesity (HCC) 06/14/2015  . Benign essential HTN 06/14/2015  . Dysphagia 06/13/2015  . Depression 03/18/2015  . Hypothyroidism 02/23/2007  . Anemia, B12 deficiency 02/23/2007  . CKD (chronic kidney disease) stage 2, GFR 60-89 ml/min 02/23/2007    Palliative Care Assessment & Plan   Patient Profile:    Assessment:  Acute health care associated PNA HTN  urgency encephalopathy   Recommendations/Plan:   Blood pressures and mental status better in the last 24-36 hours, patient to be moved out of step down unit to regular floor, back to [facility] [employer] on D/C. No change in goals, patient remains desiring of full code, but no prolonged artificial means after that. Also discussed with daughter in law primary caregiver Harriett Sine this am. Continue current mode of care.   Goals of Care and Additional Recommendations:  Limitations on Scope of Treatment: Full Scope Treatment  Code Status:    Code Status Orders        Start     Ordered   03/28/16 1927  Full code  Continuous     03/28/16 1926    Code Status History    Date Active Date Inactive Code Status Order ID Comments User Context   06/13/2015 10:39 PM 06/15/2015  5:05 PM Full Code [id number]  Ron Parker, MD Inpatient   03/19/2014  6:38 PM 06/13/2015 10:39 PM Full Code [id number]  Margit Hanks, MD Outpatient   03/15/2014  7:01 PM 03/18/2014  7:53 PM Full Code [id number]  Penny Pia, MD Inpatient       Prognosis:   guarded given advanced age, pneumonia.   Discharge Planning:  [facility] on D/C. Would recommend palliative care follow patient over there.   Care plan was discussed with patient, daughter in law Dennis.   Thank you for allowing the Palliative Medicine Team to assist in the care of this patient.   Time In: 9 Time Out: 925 Total Time 25 Prolonged Time Billed  no       Greater than 50%  of this time was spent counseling  and coordinating care related to the above assessment and plan.  Rosalin HawkingZeba Mehtaab Mayeda, MD [phone]  Please contact Palliative Medicine Team phone at [phone] for questions and concerns.

## 2016-04-04 ENCOUNTER — Encounter: Payer: Self-pay | Admitting: Internal Medicine

## 2016-04-04 ENCOUNTER — Inpatient Hospital Stay (HOSPITAL_COMMUNITY): Payer: Medicare Other

## 2016-04-04 DIAGNOSIS — I160 Hypertensive urgency: Secondary | ICD-10-CM

## 2016-04-04 DIAGNOSIS — D72829 Elevated white blood cell count, unspecified: Secondary | ICD-10-CM

## 2016-04-04 DIAGNOSIS — J189 Pneumonia, unspecified organism: Principal | ICD-10-CM

## 2016-04-04 DIAGNOSIS — F329 Major depressive disorder, single episode, unspecified: Secondary | ICD-10-CM

## 2016-04-04 LAB — CBC
HCT: 35 % — ABNORMAL LOW (ref 36.0–46.0)
Hemoglobin: 11.5 g/dL — ABNORMAL LOW (ref 12.0–15.0)
MCH: 34 pg (ref 26.0–34.0)
MCHC: 32.9 g/dL (ref 30.0–36.0)
MCV: 103.6 fL — ABNORMAL HIGH (ref 78.0–100.0)
PLATELETS: 321 10*3/uL (ref 150–400)
RBC: 3.38 MIL/uL — ABNORMAL LOW (ref 3.87–5.11)
RDW: 14.3 % (ref 11.5–15.5)
WBC: 16.2 10*3/uL — AB (ref 4.0–10.5)

## 2016-04-04 LAB — BASIC METABOLIC PANEL
Anion gap: 7 (ref 5–15)
BUN: 25 mg/dL — ABNORMAL HIGH (ref 6–20)
BUN: 25 mg/dL — AB (ref 4–21)
CO2: 28 mmol/L (ref 22–32)
CREATININE: 0.9 mg/dL (ref 0.5–1.1)
CREATININE: 0.87 mg/dL (ref 0.44–1.00)
Calcium: 8.7 mg/dL — ABNORMAL LOW (ref 8.9–10.3)
Chloride: 94 mmol/L — ABNORMAL LOW (ref 101–111)
GFR calc Af Amer: >60 mL/min (ref >60)
GFR, EST NON AFRICAN AMERICAN: 57 mL/min — AB (ref >60)
GLUCOSE: 108 mg/dL — AB (ref 65–99)
Glucose: 108 mg/dL
POTASSIUM: 4.5 mmol/L (ref 3.4–5.3)
Potassium: 4.5 mmol/L (ref 3.5–5.1)
SODIUM: 129 mmol/L — AB (ref 135–145)
Sodium: 129 mmol/L — AB (ref 137–147)

## 2016-04-04 LAB — CBC AND DIFFERENTIAL
HCT: 35 % — AB (ref 36–46)
Hemoglobin: 11.5 g/dL — AB (ref 12.0–16.0)
Platelets: 321 10*3/uL (ref 150–399)
WBC: 16.2 10^3/mL

## 2016-04-04 MED ORDER — IPRATROPIUM BROMIDE 0.02 % IN SOLN
0.5000 mg | Freq: Four times a day (QID) | RESPIRATORY_TRACT | Status: DC
  Administered 2016-04-05 – 2016-04-06 (×7): 0.5 mg via RESPIRATORY_TRACT
  Filled 2016-04-04 (×7): qty 2.5

## 2016-04-04 MED ORDER — FUROSEMIDE 10 MG/ML IJ SOLN
40.0000 mg | Freq: Once | INTRAMUSCULAR | Status: AC
  Administered 2016-04-04: 40 mg via INTRAVENOUS
  Filled 2016-04-04: qty 4

## 2016-04-04 MED ORDER — LEVALBUTEROL HCL 0.63 MG/3ML IN NEBU
0.6300 mg | INHALATION_SOLUTION | Freq: Four times a day (QID) | RESPIRATORY_TRACT | Status: DC
  Administered 2016-04-05 – 2016-04-06 (×7): 0.63 mg via RESPIRATORY_TRACT
  Filled 2016-04-04 (×7): qty 3

## 2016-04-04 MED ORDER — IPRATROPIUM BROMIDE 0.02 % IN SOLN
0.5000 mg | RESPIRATORY_TRACT | Status: DC
  Administered 2016-04-04 (×4): 0.5 mg via RESPIRATORY_TRACT
  Filled 2016-04-04 (×3): qty 2.5

## 2016-04-04 MED ORDER — VITAMINS A & D EX OINT
TOPICAL_OINTMENT | CUTANEOUS | Status: AC
  Administered 2016-04-04: 23:00:00
  Filled 2016-04-04: qty 5

## 2016-04-04 MED ORDER — METHYLPREDNISOLONE SODIUM SUCC 125 MG IJ SOLR
60.0000 mg | Freq: Two times a day (BID) | INTRAMUSCULAR | Status: DC
  Administered 2016-04-04 – 2016-04-05 (×3): 60 mg via INTRAVENOUS
  Filled 2016-04-04 (×3): qty 2

## 2016-04-04 MED ORDER — LEVALBUTEROL HCL 0.63 MG/3ML IN NEBU: 0.6300 mg | INHALATION_SOLUTION | RESPIRATORY_TRACT | Status: DC

## 2016-04-04 MED ORDER — IPRATROPIUM BROMIDE 0.02 % IN SOLN
0.5000 mg | RESPIRATORY_TRACT | Status: DC | PRN
Start: 2016-04-04 — End: 2016-04-06
  Filled 2016-04-04: qty 2.5

## 2016-04-04 MED ORDER — LEVALBUTEROL HCL 0.63 MG/3ML IN NEBU
0.6300 mg | INHALATION_SOLUTION | RESPIRATORY_TRACT | Status: DC | PRN
  Administered 2016-04-06: 0.63 mg via RESPIRATORY_TRACT
  Filled 2016-04-04: qty 3

## 2016-04-04 MED ORDER — LEVALBUTEROL HCL 0.63 MG/3ML IN NEBU
0.6300 mg | INHALATION_SOLUTION | RESPIRATORY_TRACT | Status: DC
  Administered 2016-04-04 (×3): 0.63 mg via RESPIRATORY_TRACT
  Filled 2016-04-04 (×3): qty 3

## 2016-04-04 NOTE — Progress Notes (Signed)
Opened in error; Disregard.

## 2016-04-04 NOTE — Progress Notes (Addendum)
Patient ID: Kristine [name], female   DOB: 1923-09-20, 82 y.o.   MRN: [id number]  PROGRESS NOTE    ELLIONA DODDRIDGE  WUJ:[id number] DOB: [birth year] DOA: 03/28/2016  PCP: Merrilee Seashore, MD   Brief Narrative:  82 y.o.femalewith HTN, depression who presented from [facility] with main concern of one week duration of progressively worsening cough and dyspnea, initially present with exertion and has progressed to dyspnea at rest. Cough has been mixed, non productive and occasionally productive of clear sputum, associated subjective fevers, chills, malaise, poor oral intake.  During this hospital stay she has required nitroglycerin drip 03/31/2016 which was then changed to Cardene drip 04/01/2016 as nitro drip was not effective. She is off of Cardene drip (stopped 9/10.  Assessment & Plan:  Active Problems: Acute respiratory failure with hypoxia secondary to HCAP (healthcare-associated pneumonia) / Leukocytosis / bilateral pleural effusions - Wheezing this morning concerning for ongoing pneumonia. Chest x-ray this morning shows increasing left base collapse and consolidation with persistent bilateral pleural effusions - Continue Unasyn  - We changed nebulizer treatment to Xopenex and Atrovent every 2 hours as needed for shortness of breath or wheezing as well as every 4 hours scheduled - Added Solu-Medrol 60 mg IV every 12 hours - We will give Lasix 40 mg IV for bilateral pleural effusions. Blood pressure is stable currently. - Continue to monitor white blood cell count - CT chest notable for mass-like, irregular and ill-defined subpleural opacity in the superior segment of the left lower lobe may represent a region of rounded/masslike pneumonia, or a true pulmonary mass. Pneumonia favored.  - Appreciate palliative care following an their recommendations - Continue oxygen support via nasal cannula to keep oxygen saturation above 90%  Hypertensive urgency - Stopped Cardene drip 9/10 -  Currently on Coreg 12.5 mg BID, hydralazine 50 mg Q 6 hours, Avapro 300 mg  - BP this am 162/46    Acute metabolic encephalopathy - Resolved 9/8  Hypothyroidism  - Continue synthroid 150 mcg daily   - TSH normal    Hyponatremia - Likely due to acute pulmonary process, pleural effusion, pneumonia    Depression - Continue Lexapro 20 mg daily    Macrocytic anemia - Hemoglobin stable at 11.5    DVT prophylaxis:Lovenox subQ  Code Status:DNI; confirmed with pt family at the bedside this am Family Communication:Son and daughter in law at the bedside this am  Disposition Plan: to SNF onnce medically stable   Consultants:   PCT  Procedures:   None  Antimicrobials:   Vancomycin 9/4 --> 9/7  Maxipime 9/4 -->9/8  UnaSyn 9/9 -->    Subjective: Wheezing this am.  Objective: Vitals:   04/04/16 0418 04/04/16 0824 04/04/16 0845 04/04/16 0858  BP: (!) 165/65  (!) 162/46   Pulse: 66  64   Resp: 18     Temp: 98 F (36.7 C)     TempSrc: Axillary     SpO2: 95% (!) 86% 90% 90%  Weight: 101.1 kg (222 lb 14.2 oz)     Height:        Intake/Output Summary (Last 24 hours) at 04/04/16 1014 Last data filed at 04/04/16 0230  Gross per 24 hour  Intake              540 ml  Output              100 ml  Net              440 ml  Filed Weights   04/02/16 0420 04/03/16 0215 04/04/16 0418  Weight: 99.6 kg (219 lb 9.3 oz) 102.2 kg (225 lb 5 oz) 101.1 kg (222 lb 14.2 oz)    Examination:  General exam: Appears calm and comfortable  Respiratory system: Wheezing in upper lung lobes, no rhonchi  Cardiovascular system: S1 & S2 heard, RRR. No JVD. Gastrointestinal system: Abdomen is nondistended, soft and nontender. No organomegaly or masses felt. Normal bowel sounds heard. Central nervous system: Alert and oriented. No focal neurological deficits. Extremities: Symmetric 5 x 5 power. Trace pedal edema Skin: No rashes, lesions or ulcers Psychiatry: Judgement and insight  appear normal. Mood & affect appropriate.   Data Reviewed: I have personally reviewed following labs and imaging studies  CBC:  Recent Labs Lab 03/28/16 1546  03/31/16 0315 04/01/16 0349 04/02/16 0646 04/03/16 0338 04/04/16 0435  WBC 12.5*  < > 12.6* 14.6* 14.3* 15.1* 16.2*  NEUTROABS 9.6*  --   --   --   --   --   --   HGB 12.5  < > 11.6* 10.6* 11.5* 11.5* 11.5*  HCT 38.2  < > 36.4 31.9* 34.3* 34.9* 35.0*  MCV 105.5*  < > 105.5* 103.6* 101.8* 102.9* 103.6*  PLT 254  < > 292 264 255 281 321  < > = values in this interval not displayed. Basic Metabolic Panel:  Recent Labs Lab 03/31/16 0315 04/01/16 0349 04/02/16 0646 04/03/16 0338 04/04/16 0435  NA 140 133* 131* 133* 129*  K 3.5 3.5 3.5 3.8 4.5  CL 102 98* 96* 98* 94*  CO2 30 29 28 28 28   GLUCOSE 124* 131* 143* 115* 108*  BUN 18 18 16  21* 25*  CREATININE 0.77 0.72 0.82 0.91 0.87  CALCIUM 9.2 8.8* 8.7* 8.8* 8.7*   GFR: Estimated Creatinine Clearance: 50.6 mL/min (by C-G formula based on SCr of 0.87 mg/dL). Liver Function Tests:  Recent Labs Lab 03/28/16 1545  AST 25  ALT 25  ALKPHOS 87  BILITOT 0.7  PROT 6.9  ALBUMIN 2.9*   No results for input(s): LIPASE, AMYLASE in the last 168 hours. No results for input(s): AMMONIA in the last 168 hours. Coagulation Profile: No results for input(s): INR, PROTIME in the last 168 hours. Cardiac Enzymes:  Recent Labs Lab 03/31/16 1545  TROPONINI 0.05*   BNP (last 3 results) No results for input(s): PROBNP in the last 8760 hours. HbA1C: No results for input(s): HGBA1C in the last 72 hours. CBG:  Recent Labs Lab 04/02/16 2205  GLUCAP 129*   Lipid Profile: No results for input(s): CHOL, HDL, LDLCALC, TRIG, CHOLHDL, LDLDIRECT in the last 72 hours. Thyroid Function Tests: No results for input(s): TSH, T4TOTAL, FREET4, T3FREE, THYROIDAB in the last 72 hours. Anemia Panel: No results for input(s): VITAMINB12, FOLATE, FERRITIN, TIBC, IRON, RETICCTPCT in the last  72 hours. Urine analysis:    Component Value Date/Time   COLORURINE YELLOW 03/29/2016 0435   APPEARANCEUR CLEAR 03/29/2016 0435   LABSPEC 1.027 03/29/2016 0435   PHURINE 5.5 03/29/2016 0435   GLUCOSEU NEGATIVE 03/29/2016 0435   HGBUR NEGATIVE 03/29/2016 0435   BILIRUBINUR NEGATIVE 03/29/2016 0435   KETONESUR 15 (A) 03/29/2016 0435   PROTEINUR 100 (A) 03/29/2016 0435   UROBILINOGEN 1.0 04/01/2014 0203   NITRITE NEGATIVE 03/29/2016 0435   LEUKOCYTESUR NEGATIVE 03/29/2016 0435   Sepsis Labs: @LABRCNTIP (procalcitonin:4,lacticidven:4)   MRSA PCR Screening     Status: None   Collection Time: 03/28/16  7:00 PM  Result Value Ref Range Status  MRSA by PCR NEGATIVE NEGATIVE Final  Culture, blood (routine x 2) Call MD if unable to obtain prior to antibiotics being given     Status: None   Collection Time: 03/28/16  7:50 PM  Result Value Ref Range Status   Specimen Description BLOOD LEFT ARM  Final   Special Requests BOTTLES DRAWN AEROBIC AND ANAEROBIC 10CC  Final   Culture   Final    NO GROWTH 5 DAYS Performed at [facility]    Report Status 04/02/2016 FINAL  Final  Culture, blood (routine x 2) Call MD if unable to obtain prior to antibiotics being given     Status: None   Collection Time: 03/28/16  7:51 PM  Result Value Ref Range Status   Specimen Description BLOOD LEFT ARM  Final   Special Requests BOTTLES DRAWN AEROBIC AND ANAEROBIC  5CC  Final   Culture   Final    NO GROWTH 5 DAYS Performed at [facility]    Report Status 04/02/2016 FINAL  Final  Urine culture     Status: None   Collection Time: 03/29/16  4:35 AM  Result Value Ref Range Status   Specimen Description URINE, CLEAN CATCH  Final   Special Requests NONE  Final   Culture NO GROWTH Performed at [facility]   Final   Report Status 03/30/2016 FINAL  Final      Radiology Studies: Dg Chest Port 1 View Result Date: 04/04/2016 Increasing left base collapse/consolidation with persistent  bilateral pleural effusions. Electronically Signed   By: Kennith CenterEric  Mansell M.D.   On: 04/04/2016 09:06     Scheduled Meds: . acetaminophen  650 mg Oral Q8H  . ampicillin-sulbactam (UNASYN) IV  3 g Intravenous Q6H  . aspirin  81 mg Oral Daily  . carvedilol  12.5 mg Oral BID WC  . Dextromethorphan-Quinidine  1 capsule Oral BID  . enoxaparin (LOVENOX) injection  40 mg Subcutaneous Q24H  . escitalopram  20 mg Oral Daily  . guaiFENesin  600 mg Oral BID  . hydrALAZINE  50 mg Oral Q6H  . ipratropium  0.5 mg Nebulization Q4H  . irbesartan  300 mg Oral Daily  . levalbuterol  0.63 mg Nebulization Q4H  . levothyroxine  150 mcg Oral QAC breakfast  . mouth rinse  15 mL Mouth Rinse BID  . methylPREDNISolone (SOLU-MEDROL) injection  60 mg Intravenous Q12H  . polyvinyl alcohol  1 drop Both Eyes BID  . vitamin B-12  1,000 mcg Oral Daily   Continuous Infusions:    LOS: 7 days    Time spent: 25 minutes  Greater than 50% of the time spent on counseling and coordinating the care.   Manson PasseyEVINE, Rhian Asebedo, MD [facility] Pager [phone]  If 7PM-7AM, please contact night-coverage [email or web address] Password [email or web address] 04/04/2016, 10:14 AM

## 2016-04-04 NOTE — Progress Notes (Signed)
CSW following for return to [facility] when medically ready. CSW has completed FL2 & will continue to follow and assist with return.    Ernesto Zukowski, LCSW  [facility] Clinical Social Worker cell #: [phone]  

## 2016-04-04 NOTE — Plan of Care (Signed)
Problem: Safety: Goal: Ability to remain free from injury will improve Outcome: Completed/Met Date Met: 04/04/16 Discussed safety prevention measures with pt/family while at bedside. Bed alarm set

## 2016-04-04 NOTE — Care Management Note (Signed)
Case Management Note  Patient Details  Name: Adah SalvageWilma O Gadea MRN: [id number] Date of Birth: 11-23-1923  Subjective/Objective: 82 y/o f admitted w/PNA[facility] CSW already following for return.                   Action/Plan:d/c SNF.   Expected Discharge Date:                  Expected Discharge Plan:  Skilled Nursing Facility  In-House Referral:  Clinical Social Work  Discharge planning Services  CM Consult  Post Acute Care Choice:    Choice offered to:     DME Arranged:    DME Agency:     HH Arranged:    HH Agency:     Status of Service:  In process, will continue to follow  If discussed at Long Length of Stay Meetings, dates discussed:    Additional Comments:  Lanier ClamMahabir, Shaune Westfall, RN 04/04/2016, 10:09 AM

## 2016-04-05 DIAGNOSIS — J90 Pleural effusion, not elsewhere classified: Secondary | ICD-10-CM

## 2016-04-05 LAB — BASIC METABOLIC PANEL
Anion gap: 9 (ref 5–15)
BUN: 28 mg/dL — AB (ref 4–21)
BUN: 28 mg/dL — AB (ref 6–20)
CALCIUM: 9 mg/dL (ref 8.9–10.3)
CHLORIDE: 92 mmol/L — AB (ref 101–111)
CO2: 29 mmol/L (ref 22–32)
CREATININE: 0.99 mg/dL (ref 0.44–1.00)
Creatinine: 1 mg/dL (ref 0.5–1.1)
GFR calc non Af Amer: 49 mL/min — ABNORMAL LOW (ref >60)
GFR, EST AFRICAN AMERICAN: 56 mL/min — AB (ref >60)
GLUCOSE: 139 mg/dL — AB (ref 65–99)
Glucose: 139 mg/dL
POTASSIUM: 4.5 mmol/L (ref 3.4–5.3)
Potassium: 4.5 mmol/L (ref 3.5–5.1)
SODIUM: 130 mmol/L — AB (ref 137–147)
Sodium: 130 mmol/L — ABNORMAL LOW (ref 135–145)

## 2016-04-05 LAB — CBC
HEMATOCRIT: 36.7 % (ref 36.0–46.0)
HEMOGLOBIN: 12.2 g/dL (ref 12.0–15.0)
MCH: 34.3 pg — AB (ref 26.0–34.0)
MCHC: 33.2 g/dL (ref 30.0–36.0)
MCV: 103.1 fL — AB (ref 78.0–100.0)
Platelets: 384 10*3/uL (ref 150–400)
RBC: 3.56 MIL/uL — ABNORMAL LOW (ref 3.87–5.11)
RDW: 13.9 % (ref 11.5–15.5)
WBC: 14.8 10*3/uL — ABNORMAL HIGH (ref 4.0–10.5)

## 2016-04-05 LAB — CBC AND DIFFERENTIAL
HEMATOCRIT: 37 % (ref 36–46)
HEMOGLOBIN: 12.2 g/dL (ref 12.0–16.0)
Platelets: 384 10*3/uL (ref 150–399)
WBC: 14.8 10^3/mL

## 2016-04-05 MED ORDER — AMLODIPINE BESYLATE 5 MG PO TABS
5.0000 mg | ORAL_TABLET | Freq: Every day | ORAL | Status: DC
  Administered 2016-04-05 – 2016-04-06 (×2): 5 mg via ORAL
  Filled 2016-04-05 (×2): qty 1

## 2016-04-05 MED ORDER — METHYLPREDNISOLONE SODIUM SUCC 125 MG IJ SOLR
60.0000 mg | Freq: Every day | INTRAMUSCULAR | Status: DC
  Administered 2016-04-06: 60 mg via INTRAVENOUS
  Filled 2016-04-05: qty 2

## 2016-04-05 MED ORDER — INFLUENZA VAC SPLIT QUAD 0.5 ML IM SUSY
0.5000 mL | PREFILLED_SYRINGE | INTRAMUSCULAR | Status: AC
  Administered 2016-04-06: 0.5 mL via INTRAMUSCULAR
  Filled 2016-04-05 (×2): qty 0.5

## 2016-04-05 MED ORDER — FUROSEMIDE 10 MG/ML IJ SOLN
40.0000 mg | Freq: Once | INTRAMUSCULAR | Status: AC
  Administered 2016-04-05: 40 mg via INTRAVENOUS
  Filled 2016-04-05: qty 4

## 2016-04-05 MED ORDER — ALPRAZOLAM 0.25 MG PO TABS
0.2500 mg | ORAL_TABLET | Freq: Three times a day (TID) | ORAL | Status: DC | PRN
  Administered 2016-04-05: 0.25 mg via ORAL
  Filled 2016-04-05: qty 1

## 2016-04-05 NOTE — Progress Notes (Addendum)
Patient ID: Kristine [name], female   DOB: [birth year], 82 y.o.   MRN: [id number]  PROGRESS NOTE    Kristine [name]  ONG:[id number] DOB: 17-Dec-1923 DOA: 03/28/2016  PCP: Merrilee Seashore, MD   Brief Narrative:  83 y.o.femalewith HTN, depression who presented from [facility] with main concern of one week duration of progressively worsening cough and dyspnea, initially present with exertion and has progressed to dyspnea at rest. Cough has been mixed, non productive and occasionally productive of clear sputum, associated subjective fevers, chills, malaise, poor oral intake.  During this hospital stay she has required nitroglycerin drip 03/31/2016 which was then changed to Cardene drip 04/01/2016 as nitro drip was not effective. She is off of Cardene drip (stopped 9/10).  Assessment & Plan:  Active Problems: Acute respiratory failure with hypoxia secondary to HCAP (healthcare-associated pneumonia) / Leukocytosis / bilateral pleural effusions - Wheezing this morning concerning for ongoing pneumonia. Chest x-ray this morning shows increasing left base collapse and consolidation with persistent bilateral pleural effusions - Continue Unasyn  - Continue Xopenex and Atrovent every 2 hours as needed for shortness of breath or wheezing as well as every 4 hours scheduled - Continue Solu-Medrol 60 mg IV daily  - Has gotten lasix 40 gm IV 9/11 and one dose today 40 mg IV - CT chest notable for mass-like, irregular and ill-defined subpleural opacity in the superior segment of the left lower lobe may represent a region of rounded/masslike pneumonia, or a true pulmonary mass. Pneumonia favored.  - Appreciate palliative care following an their recommendations - Continue oxygen support via nasal cannula to keep oxygen saturation above 90%  Hypertensive urgency - Stopped Cardene drip 9/10 - Continue Coreg 12.5 mg BID, hydralazine 50 mg Q 6 hours, Avapro 300 mg     Acute metabolic encephalopathy - Resolved  9/8  Hypothyroidism  - Continue synthroid 150 mcg daily   - TSH normal    Hyponatremia - Likely due to acute pulmonary process, pleural effusion, pneumonia    Depression - Continue Lexapro 20 mg daily    Macrocytic anemia - Hemoglobin stable   DVT prophylaxis:Lovenox subQ  Code Status:DNI; confirmed with pt family at the bedside this am Family Communication:family not at the bedside this am  Disposition Plan: to SNF likely in am   Consultants:   PCT  Procedures:   None  Antimicrobials:   Vancomycin 9/4 --> 9/7  Maxipime 9/4 -->9/8  UnaSyn 9/9 -->    Subjective: Feels better this am.  Objective: Vitals:   04/04/16 2228 04/05/16 0016 04/05/16 0453 04/05/16 0721  BP: (!) 193/62 (!) 180/67 (!) 183/49   Pulse: 64 65 70   Resp: 16     Temp: 97.7 F (36.5 C)  97.9 F (36.6 C)   TempSrc: Oral  Oral   SpO2: 92%  95% 94%  Weight:   103.8 kg (228 lb 13.4 oz)   Height:        Intake/Output Summary (Last 24 hours) at 04/05/16 1302 Last data filed at 04/05/16 1100  Gross per 24 hour  Intake              990 ml  Output                0 ml  Net              990 ml   Filed Weights   04/03/16 0215 04/04/16 0418 04/05/16 0453  Weight: 102.2 kg (225 lb 5 oz) 101.1  kg (222 lb 14.2 oz) 103.8 kg (228 lb 13.4 oz)    Examination:  General exam: Appears calm and comfortable, no distress  Respiratory system: diminished but less wheezing this am  Cardiovascular system: S1 & S2 heard, Rate controlled  Gastrointestinal system: (+) BS, non tender  Central nervous system: No focal neurological deficits. Extremities: Slight edema in LE, palpable pulses  Skin: warm and dry  Psychiatry: Mood & affect appropriate.   Data Reviewed: I have personally reviewed following labs and imaging studies  CBC:  Recent Labs Lab 04/01/16 0349 04/02/16 0646 04/03/16 0338 04/04/16 0435 04/05/16 0522  WBC 14.6* 14.3* 15.1* 16.2* 14.8*  HGB 10.6* 11.5* 11.5* 11.5* 12.2    HCT 31.9* 34.3* 34.9* 35.0* 36.7  MCV 103.6* 101.8* 102.9* 103.6* 103.1*  PLT 264 255 281 321 384   Basic Metabolic Panel:  Recent Labs Lab 04/01/16 0349 04/02/16 0646 04/03/16 0338 04/04/16 0435 04/05/16 0629  NA 133* 131* 133* 129* 130*  K 3.5 3.5 3.8 4.5 4.5  CL 98* 96* 98* 94* 92*  CO2 29 28 28 28 29   GLUCOSE 131* 143* 115* 108* 139*  BUN 18 16 21* 25* 28*  CREATININE 0.72 0.82 0.91 0.87 0.99  CALCIUM 8.8* 8.7* 8.8* 8.7* 9.0   GFR: Estimated Creatinine Clearance: 45.1 mL/min (by C-G formula based on SCr of 0.99 mg/dL). Liver Function Tests: No results for input(s): AST, ALT, ALKPHOS, BILITOT, PROT, ALBUMIN in the last 168 hours. No results for input(s): LIPASE, AMYLASE in the last 168 hours. No results for input(s): AMMONIA in the last 168 hours. Coagulation Profile: No results for input(s): INR, PROTIME in the last 168 hours. Cardiac Enzymes:  Recent Labs Lab 03/31/16 1545  TROPONINI 0.05*   BNP (last 3 results) No results for input(s): PROBNP in the last 8760 hours. HbA1C: No results for input(s): HGBA1C in the last 72 hours. CBG:  Recent Labs Lab 04/02/16 2205  GLUCAP 129*   Lipid Profile: No results for input(s): CHOL, HDL, LDLCALC, TRIG, CHOLHDL, LDLDIRECT in the last 72 hours. Thyroid Function Tests: No results for input(s): TSH, T4TOTAL, FREET4, T3FREE, THYROIDAB in the last 72 hours. Anemia Panel: No results for input(s): VITAMINB12, FOLATE, FERRITIN, TIBC, IRON, RETICCTPCT in the last 72 hours. Urine analysis:    Component Value Date/Time   COLORURINE YELLOW 03/29/2016 0435   APPEARANCEUR CLEAR 03/29/2016 0435   LABSPEC 1.027 03/29/2016 0435   PHURINE 5.5 03/29/2016 0435   GLUCOSEU NEGATIVE 03/29/2016 0435   HGBUR NEGATIVE 03/29/2016 0435   BILIRUBINUR NEGATIVE 03/29/2016 0435   KETONESUR 15 (A) 03/29/2016 0435   PROTEINUR 100 (A) 03/29/2016 0435   UROBILINOGEN 1.0 04/01/2014 0203   NITRITE NEGATIVE 03/29/2016 0435   LEUKOCYTESUR  NEGATIVE 03/29/2016 0435   Sepsis Labs: @LABRCNTIP (procalcitonin:4,lacticidven:4)   MRSA PCR Screening     Status: None   Collection Time: 03/28/16  7:00 PM  Result Value Ref Range Status   MRSA by PCR NEGATIVE NEGATIVE Final  Culture, blood (routine x 2) Call MD if unable to obtain prior to antibiotics being given     Status: None   Collection Time: 03/28/16  7:50 PM  Result Value Ref Range Status   Specimen Description BLOOD LEFT ARM  Final   Special Requests BOTTLES DRAWN AEROBIC AND ANAEROBIC 10CC  Final   Culture   Final    NO GROWTH 5 DAYS Performed at [facility]    Report Status 04/02/2016 FINAL  Final  Culture, blood (routine x 2) Call MD  if unable to obtain prior to antibiotics being given     Status: None   Collection Time: 03/28/16  7:51 PM  Result Value Ref Range Status   Specimen Description BLOOD LEFT ARM  Final   Special Requests BOTTLES DRAWN AEROBIC AND ANAEROBIC  5CC  Final   Culture   Final    NO GROWTH 5 DAYS Performed at [facility]    Report Status 04/02/2016 FINAL  Final  Urine culture     Status: None   Collection Time: 03/29/16  4:35 AM  Result Value Ref Range Status   Specimen Description URINE, CLEAN CATCH  Final   Special Requests NONE  Final   Culture NO GROWTH Performed at [facility]   Final   Report Status 03/30/2016 FINAL  Final      Radiology Studies: Dg Chest Port 1 View Result Date: 04/04/2016 Increasing left base collapse/consolidation with persistent bilateral pleural effusions. Electronically Signed   By: Kennith CenterEric  Mansell M.D.   On: 04/04/2016 09:06     Scheduled Meds: . acetaminophen  650 mg Oral Q8H  . amLODipine  5 mg Oral Daily  . ampicillin-sulbactam (UNASYN) IV  3 g Intravenous Q6H  . aspirin  81 mg Oral Daily  . carvedilol  12.5 mg Oral BID WC  . Dextromethorphan-Quinidine  1 capsule Oral BID  . enoxaparin (LOVENOX) injection  40 mg Subcutaneous Q24H  . escitalopram  20 mg Oral Daily  .  guaiFENesin  600 mg Oral BID  . hydrALAZINE  50 mg Oral Q6H  . ipratropium  0.5 mg Nebulization Q6H  . irbesartan  300 mg Oral Daily  . levalbuterol  0.63 mg Nebulization Q6H  . levothyroxine  150 mcg Oral QAC breakfast  . mouth rinse  15 mL Mouth Rinse BID  . methylPREDNISolone (SOLU-MEDROL) injection  60 mg Intravenous Q12H  . polyvinyl alcohol  1 drop Both Eyes BID  . vitamin B-12  1,000 mcg Oral Daily   Continuous Infusions:    LOS: 8 days    Time spent: 15 minutes  Greater than 50% of the time spent on counseling and coordinating the care.   Manson PasseyEVINE, [place] Prout, MD [facility] Pager [phone]  If 7PM-7AM, please contact night-coverage [email or web address] Password [email or web address] 04/05/2016, 1:02 PM

## 2016-04-06 DIAGNOSIS — J189 Pneumonia, unspecified organism: Secondary | ICD-10-CM | POA: Diagnosis not present

## 2016-04-06 DIAGNOSIS — E871 Hypo-osmolality and hyponatremia: Secondary | ICD-10-CM

## 2016-04-06 LAB — BASIC METABOLIC PANEL
Anion gap: 7 (ref 5–15)
BUN: 33 mg/dL — AB (ref 6–20)
BUN: 33 mg/dL — AB (ref 4–21)
CHLORIDE: 92 mmol/L — AB (ref 101–111)
CO2: 31 mmol/L (ref 22–32)
CREATININE: 0.99 mg/dL (ref 0.44–1.00)
CREATININE: 1 mg/dL (ref 0.5–1.1)
Calcium: 8.6 mg/dL — ABNORMAL LOW (ref 8.9–10.3)
GFR calc Af Amer: 56 mL/min — ABNORMAL LOW (ref >60)
GFR calc non Af Amer: 49 mL/min — ABNORMAL LOW (ref >60)
Glucose, Bld: 129 mg/dL — ABNORMAL HIGH (ref 65–99)
Glucose: 129 mg/dL
Potassium: 4 mmol/L (ref 3.5–5.1)
Sodium: 130 mmol/L — AB (ref 137–147)
Sodium: 130 mmol/L — ABNORMAL LOW (ref 135–145)

## 2016-04-06 LAB — CBC
HEMATOCRIT: 35.9 % — AB (ref 36.0–46.0)
HEMOGLOBIN: 11.8 g/dL — AB (ref 12.0–15.0)
MCH: 33.8 pg (ref 26.0–34.0)
MCHC: 32.9 g/dL (ref 30.0–36.0)
MCV: 102.9 fL — ABNORMAL HIGH (ref 78.0–100.0)
Platelets: 421 10*3/uL — ABNORMAL HIGH (ref 150–400)
RBC: 3.49 MIL/uL — ABNORMAL LOW (ref 3.87–5.11)
RDW: 14.2 % (ref 11.5–15.5)
WBC: 15 10*3/uL — ABNORMAL HIGH (ref 4.0–10.5)

## 2016-04-06 LAB — CBC AND DIFFERENTIAL: WBC: 15 10*3/mL

## 2016-04-06 MED ORDER — AMLODIPINE BESYLATE 5 MG PO TABS: 5.0000 mg | ORAL_TABLET | Freq: Every day | ORAL | 0 refills | Status: AC

## 2016-04-06 MED ORDER — CEFPODOXIME PROXETIL 200 MG PO TABS: 200.0000 mg | ORAL_TABLET | Freq: Two times a day (BID) | ORAL | 0 refills | Status: AC

## 2016-04-06 MED ORDER — HYDRALAZINE HCL 50 MG PO TABS: 50.0000 mg | ORAL_TABLET | Freq: Three times a day (TID) | ORAL | 0 refills | Status: AC

## 2016-04-06 MED ORDER — FUROSEMIDE 10 MG/ML IJ SOLN
40.0000 mg | Freq: Once | INTRAMUSCULAR | Status: AC
  Administered 2016-04-06: 40 mg via INTRAVENOUS
  Filled 2016-04-06: qty 4

## 2016-04-06 MED ORDER — GUAIFENESIN ER 600 MG PO TB12: 600.0000 mg | ORAL_TABLET | Freq: Two times a day (BID) | ORAL | 0 refills | Status: AC | PRN

## 2016-04-06 MED ORDER — FUROSEMIDE 20 MG PO TABS: 20.0000 mg | ORAL_TABLET | Freq: Every day | ORAL | 0 refills | Status: AC

## 2016-04-06 NOTE — Progress Notes (Signed)
Patient is set to discharge back to [facility] today. Patient & daughter-in-law, Harriett Sineancy made aware. Discharge packet given to RN, Annabelle Harmanana. PTAR will be called for transport once she voids. RN aware to call CSW when ready for transport to be called.     Lincoln MaxinKelly Earon Rivest, LCSW [facility] Clinical Social Worker cell #: [phone]

## 2016-04-06 NOTE — Progress Notes (Signed)
Report called to Maury Dusonna RN at [facility]. Awaiting [facility] for transportation. Melton Alarana A Akshith Moncus, RN

## 2016-04-06 NOTE — Progress Notes (Signed)
Nutrition Brief Note  Patient seen for LOS (9 days).  Wt Readings from Last 15 Encounters:  04/06/16 231 lb 14.8 oz (105.2 kg)  03/25/16 219 lb (99.3 kg)  03/03/16 219 lb (99.3 kg)  02/12/16 219 lb 3.2 oz (99.4 kg)  01/13/16 222 lb 3.2 oz (100.8 kg)  12/15/15 218 lb (98.9 kg)  12/15/15 218 lb (98.9 kg)  06/13/15 218 lb 0.6 oz (98.9 kg)  03/15/14 205 lb 14.6 oz (93.4 kg)    Body mass index is 38.59 kg/m. Patient meets criteria for obesity based on current BMI. Skin WDL.   Current diet order is Regular. Per chart review, pt consumed 25% of breakfast and 50% of dinner 910; 30% of breakfast, 50% of lunch, and 70% of dinner 9/12; 100% of breakfast this AM.   Medications reviewed; 40 mg oral Lasix x1 dose yesterday, 40 mg IV Lasix x1 dose today, 150 mg oral Synthroid/day, PRN Zoran, 1000 mcg vitamin B12/day.  Labs reviewed; Na: 130 mmol/L, Cl: 92 mmol/L, BUN: 33 mg/dL, Ca: 8.6 mg/dL, GFR: 49 mL/min.  Per MD note yesterday PM, likely that pt could return to [facility] today. No nutrition interventions warranted at this time. If nutrition issues arise, please consult RD. If pt unable to d/c today, RD will monitor for needs for nutrition interventions.     Trenton GammonJessica Jean Alejos, MS, RD, LDN Inpatient Clinical Dietitian Pager # [phone] After hours/weekend pager # [phone]

## 2016-04-06 NOTE — Discharge Summary (Signed)
Physician Discharge Summary  JENNAFER GLADUE ZOX:[id number] DOB: [birth year] DOA: 03/28/2016  PCP: Merrilee Seashore, MD  Admit date: 03/28/2016 Discharge date: 04/06/2016  Recommendations for Outpatient Follow-up:  Please continue low-dose Lasix 20 mg daily on discharge. Patient's potassium is 4. Please continue to recheck kidney function and potassium level about once a week to make sure those are within normal limits.  Continue cefpodoxime for 5 days on discharge.  Please continue to have palliative care services follow when patient is in skilled nursing facility.  Discharge Diagnoses:  Active Problems:   HCAP (healthcare-associated pneumonia)   Hypertensive urgency   Dyspnea   Advance care planning   Goals of care, counseling/discussion   Palliative care by specialist    Discharge Condition: stable   Diet recommendation: as tolerated   History of present illness:  82 y.o.femalewith HTN, depression who presented from [facility] with main concern of one week duration of progressively worsening cough and dyspnea, initially present with exertion and has progressed to dyspnea at rest. Cough has been mixed, non productive and occasionally productive of clear sputum, associated subjective fevers, chills, malaise, poor oral intake.  During this hospital stay she has required nitroglycerin drip 03/31/2016 which was then changed to Cardene drip 04/01/2016 as nitro drip was not effective. She is off of Cardene drip (stopped 9/10).  Hospital Course:    Assessment & Plan:  Active Problems: Acute respiratory failure with hypoxia secondary to HCAP (healthcare-associated pneumonia) / Leukocytosis / bilateral pleural effusions - Wheezing this morning concerning for ongoing pneumonia. Chest x-ray this morning shows increasing left base collapse and consolidation with persistent bilateral pleural effusions - Stop Unasyn today and use cefpodoxime for 5 days on discharge  - Continue as needed  albuterol for shortness of breath or wheezing as well as every 4 hours scheduled - Stop solumedrol today  - Has gotten lasix 40 gm IV 9/11 and one dose on 9/12 40 mg IV; will give another lasix 40 gm IV today prior to discharge - She will continue low-dose Lasix on discharged - CT chest notable for mass-like, irregular and ill-defined subpleural opacity in the superior segment of the left lower lobe may represent a region of rounded/masslike pneumonia, or a true pulmonary mass. Pneumonia favored.  - Appreciate palliative care following  - Continue oxygen support via nasal cannula to keep oxygen saturation above 90%  Hypertensive urgency - Stopped Cardene drip 9/10 - Continue Coreg 12.5 mg BID, hydralazine 50 mg Q 8 hours, Avapro 300 mg   Acute metabolic encephalopathy - Resolved 9/8  Hypothyroidism  - Continue synthroid 150 mcg daily   - TSH normal    Hyponatremia - Likely due to acute pulmonary process, pleural effusion, pneumonia - Improved, 130 this am     Depression - Continue Lexapro 20 mg daily    Macrocytic anemia - Hemoglobin stable   DVT prophylaxis:Lovenox subQ in hospital  Code Status:DNI; confirmed with pt family at the bedside this am Family Communication:son and daughter in law at the bedside this am    Consultants:  PCT  Procedures:  None  Antimicrobials:   Vancomycin 9/4 --> 9/7  Maxipime 9/4 -->9/8  UnaSyn 9/9 --> 9/13    Signed:  Manson Passey, MD  [facility] 04/06/2016, 10:43 AM  Pager #: [phone]  Time spent in minutes: more than 30 minutes    Discharge Exam: Vitals:   04/06/16 0850 04/06/16 0853  BP: (!) 186/55 (!) 186/55  Pulse:  70  Resp:  Temp:     Vitals:   04/06/16 0637 04/06/16 0850 04/06/16 0853 04/06/16 0929  BP: (!) 184/64 (!) 186/55 (!) 186/55   Pulse:   70   Resp:      Temp:      TempSrc:      SpO2:    95%  Weight:      Height:        General: Pt is alert, follows  commands appropriately, not in acute distress Cardiovascular: Regular rate and rhythm, S1/S2 + Respiratory: Clear to auscultation bilaterally, no wheezing, no crackles, no rhonchi Abdominal: Soft, non tender, non distended, bowel sounds +, no guarding Extremities: (+1) LE pitting edema, no cyanosis, pulses palpable bilaterally DP and PT Neuro: Grossly nonfocal  Discharge Instructions  Discharge Instructions    Call MD for:  difficulty breathing, headache or visual disturbances    Complete by:  As directed    Call MD for:  hives    Complete by:  As directed    Call MD for:  redness, tenderness, or signs of infection (pain, swelling, redness, odor or green/yellow discharge around incision site)    Complete by:  As directed    Call MD for:  severe uncontrolled pain    Complete by:  As directed    Diet - low sodium heart healthy    Complete by:  As directed    Discharge instructions    Complete by:  As directed    Please continue low-dose Lasix 20 mg daily on discharge. Patient's potassium is 4. Please continue to recheck kidney function and potassium level about once a week to make sure those are within normal limits.  Continue cefpodoxime for 5 days on discharge.   Increase activity slowly    Complete by:  As directed        Medication List    STOP taking these medications   ALPRAZolam 0.25 MG tablet Commonly known as:  XANAX   cefTRIAXone 1 g injection Commonly known as:  ROCEPHIN   traMADol 50 MG tablet Commonly known as:  ULTRAM     TAKE these medications   acetaminophen 325 MG tablet Commonly known as:  TYLENOL Take 650 mg by mouth every 8 (eight) hours. Not to exceed 3G   amLODipine 5 MG tablet Commonly known as:  NORVASC Take 1 tablet (5 mg total) by mouth daily.   ANTISEPTIC ORAL RINSE MT Use as directed 15 mLs in the mouth or throat 2 (two) times daily.   aspirin 81 MG chewable tablet Chew 81 mg by mouth daily.   carboxymethylcellulose 0.5 %  Soln Commonly known as:  REFRESH PLUS Place 1 drop into both eyes 2 (two) times daily.   carvedilol 25 MG tablet Commonly known as:  COREG Take 25 mg by mouth 2 (two) times daily with a meal. For HTN , NOTIFY PROVIDER IF BP IS OVER 160 FOR TWO CONSECUTIVE READINGS   cefpodoxime 200 MG tablet Commonly known as:  VANTIN Take 1 tablet (200 mg total) by mouth 2 (two) times daily.   ENSURE PO Take 237 mLs by mouth 3 (three) times daily between meals.   escitalopram 20 MG tablet Commonly known as:  LEXAPRO Take 20 mg by mouth daily.   furosemide 20 MG tablet Commonly known as:  LASIX Take 1 tablet (20 mg total) by mouth daily.   guaiFENesin 600 MG 12 hr tablet Commonly known as:  MUCINEX Take 1 tablet (600 mg total) by mouth 2 (two) times daily  as needed.   hydrALAZINE 50 MG tablet Commonly known as:  APRESOLINE Take 1 tablet (50 mg total) by mouth 3 (three) times daily.   ipratropium-albuterol 0.5-2.5 (3) MG/3ML Soln Commonly known as:  DUONEB Take 3 mLs by nebulization every 8 (eight) hours as needed. For wheezing , congestion, and dyspnea   irbesartan 300 MG tablet Commonly known as:  AVAPRO Take 300 mg by mouth daily. For HTN   levothyroxine 150 MCG tablet Commonly known as:  SYNTHROID, LEVOTHROID Take 150 mcg by mouth daily before breakfast.   NUEDEXTA 20-10 MG Caps Generic drug:  Dextromethorphan-Quinidine Take 1 capsule by mouth 2 (two) times daily.   ondansetron 4 MG tablet Commonly known as:  ZOFRAN Take 4 mg by mouth every 6 (six) hours as needed for nausea or vomiting.   OXYGEN Inhale 2 L into the lungs continuous. To maintain 02 sat >90   vitamin B-12 1000 MCG tablet Commonly known as:  CYANOCOBALAMIN Take 1,000 mcg by mouth daily.   Vitamin D3 50000 units Caps Take 1 capsule by mouth every 30 (thirty) days. TAKES ON THE 28TH OF EVERY MONTH       Contact information for follow-up providers    Merrilee Seashore, MD. Schedule an appointment as soon as  possible for a visit in 1 week(s).   Specialty:  Internal Medicine Contact information: [address] [place] [phone] [phone]            Contact information for after-discharge care    Destination    [facility] .   Specialty:  Skilled Nursing Facility Contact information: [address] [name] [address] [phone]                   The results of significant diagnostics from this hospitalization (including imaging, microbiology, ancillary and laboratory) are listed below for reference.    Significant Diagnostic Studies: Dg Chest 2 View  Result Date: 03/28/2016 CLINICAL DATA:  One week history of cough. EXAM: CHEST  2 VIEW COMPARISON:  Chest x-ray 06/13/2015 FINDINGS: The heart is borderline enlarged but stable. Stable tortuosity and calcification of the thoracic aorta. Chronic underlying bronchitic type changes. There is also peribronchial thickening, increased interstitial markings and patchy right lung infiltrates. Small bilateral pleural effusions are also noted. The bony thorax is intact. IMPRESSION: Probable bronchitis and right upper lobe and right lower lobe bronchopneumonia. Small bilateral pleural effusions. Electronically Signed   By: Rudie Meyer M.D.   On: 03/28/2016 16:35   Ct Chest Wo Contrast  Result Date: 03/30/2016 CLINICAL DATA:  82 year old female with 1 week of progressively worsening cough and dyspnea EXAM: CT CHEST WITHOUT CONTRAST TECHNIQUE: Multidetector CT imaging of the chest was performed following the standard protocol without IV contrast. COMPARISON:  Chest x-ray 03/28/2016 FINDINGS: Cardiovascular: Limited evaluation in the absence of intravenous contrast. Atherosclerotic calcifications present in the aorta. No evidence of aneurysm. Mild cardiomegaly with left ventricular dilatation. No pericardial effusion. Calcification of the mitral valve annulus. Calcifications noted along the course of  the coronary arteries. Mediastinum/Nodes: Unremarkable CT appearance of the thyroid gland. No suspicious mediastinal or hilar adenopathy. No soft tissue mediastinal mass. The thoracic esophagus is unremarkable. Lungs/Pleura: Small bilateral pleural effusions. Associated lower lobe atelectasis. Evaluation for small pulmonary nodules limited by extensive respiratory motion artifact. Masslike opacity along the posterior pleural surface in the superior segment of the left lower lobe measuring approximately 3.5 x 3.3 cm. No pulmonary edema. Upper Abdomen: No  acute findings. Musculoskeletal: No chest wall mass or suspicious bone lesions identified. Multilevel degenerative disc disease. IMPRESSION: 1. Limited study secondary to extensive motion artifact related to intractable coughing. 2. Masslike but irregular and ill-defined subpleural opacity in the superior segment of the left lower lobe may represent a region of rounded/masslike pneumonia, or a true pulmonary mass. Pneumonia is favored. Followup PA and lateral chest X-ray is recommended in 3-4 weeks following trial of antibiotic therapy to ensure resolution and exclude underlying malignancy. 3. Small bilateral pleural effusions and associated atelectasis. 4. Cardiomegaly with left ventricular dilatation. 5. Coronary artery calcifications. 6. Multilevel degenerative disc disease. Electronically Signed   By: Malachy Moan M.D.   On: 03/30/2016 19:22   Dg Chest Port 1 View  Result Date: 04/04/2016 CLINICAL DATA:  Pneumonia and shortness of breath. EXAM: PORTABLE CHEST 1 VIEW COMPARISON:  CT scan 03/30/2016.  Chest x-ray 03/28/2016. FINDINGS: 0829 hours. Increasing retrocardiac collapse/ consolidation with progressive left pleural effusion. Tiny right pleural effusion again noted. Patchy airspace disease right upper lobe has improved in the interval. The cardio pericardial silhouette is enlarged. The visualized bony structures of the thorax are intact. Telemetry  leads overlie the chest. IMPRESSION: Increasing left base collapse/consolidation with persistent bilateral pleural effusions. Electronically Signed   By: Kennith Center M.D.   On: 04/04/2016 09:06    Microbiology: Recent Results (from the past 240 hour(s))  MRSA PCR Screening     Status: None   Collection Time: 03/28/16  7:00 PM  Result Value Ref Range Status   MRSA by PCR NEGATIVE NEGATIVE Final  Culture, blood (routine x 2) Call MD if unable to obtain prior to antibiotics being given     Status: None   Collection Time: 03/28/16  7:50 PM  Result Value Ref Range Status   Specimen Description BLOOD LEFT ARM  Final   Special Requests BOTTLES DRAWN AEROBIC AND ANAEROBIC 10CC  Final   Culture   Final    NO GROWTH 5 DAYS Performed at [facility]    Report Status 04/02/2016 FINAL  Final  Culture, blood (routine x 2) Call MD if unable to obtain prior to antibiotics being given     Status: None   Collection Time: 03/28/16  7:51 PM  Result Value Ref Range Status   Specimen Description BLOOD LEFT ARM  Final   Special Requests BOTTLES DRAWN AEROBIC AND ANAEROBIC  5CC  Final   Culture   Final    NO GROWTH 5 DAYS Performed at [facility]    Report Status 04/02/2016 FINAL  Final  Urine culture     Status: None   Collection Time: 03/29/16  4:35 AM  Result Value Ref Range Status   Specimen Description URINE, CLEAN CATCH  Final   Special Requests NONE  Final   Culture NO GROWTH Performed at [facility]   Final   Report Status 03/30/2016 FINAL  Final     Labs: Basic Metabolic Panel:  Recent Labs Lab 04/02/16 0646 04/03/16 0338 04/04/16 0435 04/05/16 0629 04/06/16 0829  NA 131* 133* 129* 130* 130*  K 3.5 3.8 4.5 4.5 4.0  CL 96* 98* 94* 92* 92*  CO2 28 28 28 29 31   GLUCOSE 143* 115* 108* 139* 129*  BUN 16 21* 25* 28* 33*  CREATININE 0.82 0.91 0.87 0.99 0.99  CALCIUM 8.7* 8.8* 8.7* 9.0 8.6*   Liver Function Tests: No results for input(s): AST, ALT,  ALKPHOS, BILITOT, PROT, ALBUMIN in the last 168  hours. No results for input(s): LIPASE, AMYLASE in the last 168 hours. No results for input(s): AMMONIA in the last 168 hours. CBC:  Recent Labs Lab 04/02/16 0646 04/03/16 0338 04/04/16 0435 04/05/16 0522 04/06/16 0537  WBC 14.3* 15.1* 16.2* 14.8* 15.0*  HGB 11.5* 11.5* 11.5* 12.2 11.8*  HCT 34.3* 34.9* 35.0* 36.7 35.9*  MCV 101.8* 102.9* 103.6* 103.1* 102.9*  PLT 255 281 321 384 421*   Cardiac Enzymes:  Recent Labs Lab 03/31/16 1545  TROPONINI 0.05*   BNP: BNP (last 3 results)  Recent Labs  03/28/16 1547  BNP 515.5*    ProBNP (last 3 results) No results for input(s): PROBNP in the last 8760 hours.  CBG:  Recent Labs Lab 04/02/16 2205  GLUCAP 129*

## 2016-04-06 NOTE — Care Management Note (Signed)
Case Management Note  Patient Details  Name: Kristine [name] MRN: [id number] Date of Birth: 1924-01-28  Subjective/Objective:                    Action/Plan:d/c SNF.   Expected Discharge Date:                  Expected Discharge Plan:  Skilled Nursing Facility  In-House Referral:  Clinical Social Work  Discharge planning Services  CM Consult  Post Acute Care Choice:    Choice offered to:     DME Arranged:    DME Agency:     HH Arranged:    HH Agency:     Status of Service:  Completed, signed off  If discussed at [employer] Meetings, dates discussed:    Additional Comments:  Kristine [name], Kristine Teska, RN 04/06/2016, 11:28 AM

## 2016-04-06 NOTE — Progress Notes (Signed)
[facility] called for transport.     Demonie Kassa, LCSW [facility] Clinical Social Worker cell #: [phone]  

## 2016-04-06 NOTE — Discharge Instructions (Signed)
Furosemide tablets °What is this medicine? °FUROSEMIDE (fyoor [place] se mide) is a diuretic. It helps you make more urine and to lose salt and excess water from your body. This medicine is used to treat high blood pressure, and edema or swelling from heart, kidney, or liver disease. °This medicine may be used for other purposes; ask your health care provider or pharmacist if you have questions. °What should I tell my health care provider before I take this medicine? °They need to know if you have any of these conditions: °-abnormal blood electrolytes °-diarrhea or vomiting °-gout °-heart disease °-kidney disease, small amounts of urine, or difficulty passing urine °-liver disease °-thyroid disease °-an unusual or allergic reaction to furosemide, sulfa drugs, other medicines, foods, dyes, or preservatives °-pregnant or trying to get pregnant °-breast-feeding °How should I use this medicine? °Take this medicine by mouth with a glass of water. Follow the directions on the prescription label. You may take this medicine with or without food. If it upsets your stomach, take it with food or milk. Do not take your medicine more often than directed. Remember that you will need to pass more urine after taking this medicine. Do not take your medicine at a time of day that will cause you problems. Do not take at bedtime. °Talk to your pediatrician regarding the use of this medicine in children. While this drug may be prescribed for selected conditions, precautions do apply. °Overdosage: If you think you have taken too much of this medicine contact a poison control center or emergency room at once. °NOTE: This medicine is only for you. Do not share this medicine with others. °What if I miss a dose? °If you miss a dose, take it as soon as you can. If it is almost time for your next dose, take only that dose. Do not take double or extra doses. °What may interact with this medicine? °-aspirin and aspirin-like medicines °-certain  antibiotics °-chloral hydrate °-cisplatin °-cyclosporine °-digoxin °-diuretics °-laxatives °-lithium °-medicines for blood pressure °-medicines that relax muscles for surgery °-methotrexate °-NSAIDs, medicines for pain and inflammation like ibuprofen, naproxen, or indomethacin °-phenytoin °-steroid medicines like prednisone or cortisone °-sucralfate °-thyroid hormones °This list may not describe all possible interactions. Give your health care provider a list of all the medicines, herbs, non-prescription drugs, or dietary supplements you use. Also tell them if you smoke, drink alcohol, or use illegal drugs. Some items may interact with your medicine. °What should I watch for while using this medicine? °Visit your doctor or health care professional for regular checks on your progress. Check your blood pressure regularly. Ask your doctor or health care professional what your blood pressure should be, and when you should contact him or her. If you are a diabetic, check your blood sugar as directed. °You may need to be on a special diet while taking this medicine. Check with your doctor. Also, ask how many glasses of fluid you need to drink a day. You must not get dehydrated. °You may get drowsy or dizzy. Do not drive, use machinery, or do anything that needs mental alertness until you know how this drug affects you. Do not stand or sit up quickly, especially if you are an older patient. This reduces the risk of dizzy or fainting spells. Alcohol can make you more drowsy and dizzy. Avoid alcoholic drinks. °This medicine can make you more sensitive to the sun. Keep out of the sun. If you cannot avoid being in the sun, wear protective clothing and   use sunscreen. Do not use sun lamps or tanning beds/booths. °What side effects may I notice from receiving this medicine? °Side effects that you should report to your doctor or health care professional as soon as possible: °-blood in urine or stools °-dry mouth °-fever or  chills °-hearing loss or ringing in the ears °-irregular heartbeat °-muscle pain or weakness, cramps °-skin rash °-stomach upset, pain, or nausea °-tingling or numbness in the hands or feet °-unusually weak or tired °-vomiting or diarrhea °-yellowing of the eyes or skin °Side effects that usually do not require medical attention (report to your doctor or health care professional if they continue or are bothersome): °-headache °-loss of appetite °-unusual bleeding or bruising °This list may not describe all possible side effects. Call your doctor for medical advice about side effects. You may report side effects to FDA at 1-800-FDA-1088. °Where should I keep my medicine? °Keep out of the reach of children. °Store at room temperature between 15 and 30 degrees C (59 and 86 degrees F). Protect from light. Throw away any unused medicine after the expiration date. °NOTE: This sheet is a summary. It may not cover all possible information. If you have questions about this medicine, talk to your doctor, pharmacist, or health care provider. °  °© 2016, [employer]. (2014-10-01 13:49:50) ° °

## 2016-04-07 ENCOUNTER — Encounter: Payer: Self-pay | Admitting: Internal Medicine

## 2016-04-07 ENCOUNTER — Encounter (HOSPITAL_COMMUNITY): Payer: Self-pay

## 2016-04-07 ENCOUNTER — Emergency Department (HOSPITAL_COMMUNITY)
Admission: EM | Admit: 2016-04-07 | Discharge: 2016-04-24 | Disposition: E | Payer: Medicare Other | Attending: Emergency Medicine | Admitting: Emergency Medicine

## 2016-04-07 ENCOUNTER — Non-Acute Institutional Stay (SKILLED_NURSING_FACILITY): Payer: Medicare Other | Admitting: Internal Medicine

## 2016-04-07 DIAGNOSIS — F329 Major depressive disorder, single episode, unspecified: Secondary | ICD-10-CM | POA: Diagnosis not present

## 2016-04-07 DIAGNOSIS — E038 Other specified hypothyroidism: Secondary | ICD-10-CM

## 2016-04-07 DIAGNOSIS — Z789 Other specified health status: Secondary | ICD-10-CM

## 2016-04-07 DIAGNOSIS — J90 Pleural effusion, not elsewhere classified: Secondary | ICD-10-CM

## 2016-04-07 DIAGNOSIS — Z79899 Other long term (current) drug therapy: Secondary | ICD-10-CM | POA: Diagnosis not present

## 2016-04-07 DIAGNOSIS — F482 Pseudobulbar affect: Secondary | ICD-10-CM

## 2016-04-07 DIAGNOSIS — J9601 Acute respiratory failure with hypoxia: Secondary | ICD-10-CM

## 2016-04-07 DIAGNOSIS — R0600 Dyspnea, unspecified: Secondary | ICD-10-CM | POA: Diagnosis present

## 2016-04-07 DIAGNOSIS — E039 Hypothyroidism, unspecified: Secondary | ICD-10-CM | POA: Diagnosis not present

## 2016-04-07 DIAGNOSIS — Z515 Encounter for palliative care: Secondary | ICD-10-CM

## 2016-04-07 DIAGNOSIS — E871 Hypo-osmolality and hyponatremia: Secondary | ICD-10-CM

## 2016-04-07 DIAGNOSIS — I160 Hypertensive urgency: Secondary | ICD-10-CM

## 2016-04-07 DIAGNOSIS — E034 Atrophy of thyroid (acquired): Secondary | ICD-10-CM

## 2016-04-07 DIAGNOSIS — Z7982 Long term (current) use of aspirin: Secondary | ICD-10-CM | POA: Diagnosis not present

## 2016-04-07 DIAGNOSIS — J189 Pneumonia, unspecified organism: Secondary | ICD-10-CM

## 2016-04-07 DIAGNOSIS — N182 Chronic kidney disease, stage 2 (mild): Secondary | ICD-10-CM | POA: Diagnosis not present

## 2016-04-07 DIAGNOSIS — F32A Depression, unspecified: Secondary | ICD-10-CM

## 2016-04-07 DIAGNOSIS — I129 Hypertensive chronic kidney disease with stage 1 through stage 4 chronic kidney disease, or unspecified chronic kidney disease: Secondary | ICD-10-CM | POA: Insufficient documentation

## 2016-04-07 MED ORDER — SODIUM CHLORIDE 0.9 % IV SOLN
Freq: Once | INTRAVENOUS | Status: AC
  Administered 2016-04-07: 19:00:00 via INTRAVENOUS

## 2016-04-07 MED ORDER — PHENYLEPHRINE 40 MCG/ML (10ML) SYRINGE FOR IV PUSH (FOR BLOOD PRESSURE SUPPORT)
PREFILLED_SYRINGE | INTRAVENOUS | Status: AC
  Filled 2016-04-07: qty 10

## 2016-04-08 DIAGNOSIS — J90 Pleural effusion, not elsewhere classified: Secondary | ICD-10-CM | POA: Insufficient documentation

## 2016-04-08 DIAGNOSIS — E871 Hypo-osmolality and hyponatremia: Secondary | ICD-10-CM | POA: Insufficient documentation

## 2016-04-08 DIAGNOSIS — F482 Pseudobulbar affect: Secondary | ICD-10-CM | POA: Insufficient documentation

## 2016-04-24 DIAGNOSIS — 419620001 Death: Secondary | SNOMED CT

## 2016-04-24 NOTE — ED Triage Notes (Signed)
Pt brought in by EMS due to pt respiratory distress. Last seen normal was 3pm per facility. Pt was manually bagged to hospital.

## 2016-04-24 NOTE — ED Notes (Signed)
Left message with Annice PihJackie Perkins/ Medical Examiner to call MD Dalene SeltzerSchlossman

## 2016-04-24 NOTE — ED Notes (Signed)
Dr Ruthell RummageLuckey in room with pt, family and this RN. Pt asystole and time of death [id number]

## 2016-04-24 NOTE — Progress Notes (Signed)
MRN: [id number] Name: ARIEANA SOMOZA  Sex: female Age: 82 y.o. DOB: 23-Feb-1924  PSC #:  Facility/Room:[facility] Level Of Care: SNF Provider: Merrilee Seashore Emergency Contacts: Extended Emergency Contact Information Primary Emergency Contact: Dunkley,Nancy Address: [address]          [place] [address] [place] Home Phone: [phone] Work Phone: [phone] Mobile Phone: [phone] Relation: Relative Secondary Emergency Contact: Stannard,Bill Address: [address]          [place], [place] [address] [place] Home Phone: [phone] Mobile Phone: [phone] Relation: Son  Code Status:   Allergies: Amitriptyline; Buspar [buspirone]; Codeine; Dyazide [hydrochlorothiazide w-triamterene]; and Macrolides and ketolides  Chief Complaint  Patient presents with  . New Admit To SNF    Admit to Facility    HPI: Patient is 82 y.o. female depression and HTN who presented from [facility] with main concern of one week duration of progressively worsening cough and dyspnea, initially present with exertion and has progressed to dyspnea at rest. Cough has been mixed, non productive and occasionally productive of clear sputum, associated subjective fevers, chills, malaise, poor oral intake. Minimal response to the Rocephin received at SNF. Pt was admitted to [facility] from 9/4-13 where she was treated for acute respiratory failure 2/2 PNA and B pleural effusions complicated by hypertensive urgency requiring IV drips to control. Pt was seen by palliative care in the hospital. Pt is admitted to [facility] for residential care and supportive care. While at SNF pt will be followed for hypothyroisism, tx with synthroid, depression, tx with lexapeo and PBA, tx with neudexta.  Past Medical History:  Diagnosis Date  . Bilateral leg edema   . Hyperlipidemia   . Hypertension     Past Surgical History:  Procedure Laterality Date  . ANKLE FRACTURE SURGERY    . CHOLECYSTECTOMY         Medication List       Accurate as of 04-22-2016 11:59 PM. Always use your most recent med list.          acetaminophen 325 MG tablet Commonly known as:  TYLENOL Take 650 mg by mouth every 8 (eight) hours. Not to exceed 3G   amLODipine 5 MG tablet Commonly known as:  NORVASC Take 1 tablet (5 mg total) by mouth daily.   ANTISEPTIC ORAL RINSE MT Use as directed 15 mLs in the mouth or throat 2 (two) times daily.   aspirin 81 MG chewable tablet Chew 81 mg by mouth daily.   carboxymethylcellulose 0.5 % Soln Commonly known as:  REFRESH PLUS Place 1 drop into both eyes 2 (two) times daily.   carvedilol 25 MG tablet Commonly known as:  COREG Take 25 mg by mouth 2 (two) times daily with a meal. For HTN , NOTIFY PROVIDER IF BP IS OVER 160 FOR TWO CONSECUTIVE READINGS   cefpodoxime 200 MG tablet Commonly known as:  VANTIN Take 1 tablet (200 mg total) by mouth 2 (two) times daily.   ENSURE PO Take 237 mLs by mouth 3 (three) times daily between meals.   escitalopram 20 MG tablet Commonly known as:  LEXAPRO Take 20 mg by mouth daily.   furosemide 20 MG tablet Commonly known as:  LASIX Take 1 tablet (20 mg total) by mouth daily.   guaiFENesin 600 MG 12 hr tablet Commonly known as:  MUCINEX Take 1 tablet (600 mg total) by mouth 2 (two) times daily as needed.   hydrALAZINE 50 MG tablet Commonly known as:  APRESOLINE Take 1 tablet (50 mg total) by mouth 3 (three) times daily.   ipratropium-albuterol 0.5-2.5 (3) MG/3ML Soln Commonly known as:  DUONEB Take 3 mLs by nebulization every 8 (eight) hours as needed. For wheezing , congestion, and dyspnea   irbesartan 300 MG tablet Commonly known as:  AVAPRO Take 300 mg by mouth daily. For HTN   levothyroxine 150 MCG tablet Commonly known as:  SYNTHROID, LEVOTHROID Take 150 mcg by mouth daily before breakfast.   NUEDEXTA 20-10 MG Caps Generic drug:  Dextromethorphan-Quinidine Take 1 capsule by mouth 2 (two) times  daily.   ondansetron 4 MG tablet Commonly known as:  ZOFRAN Take 4 mg by mouth every 6 (six) hours as needed for nausea or vomiting.   OXYGEN Inhale 2 L into the lungs continuous. To maintain 02 sat >90   vitamin B-12 1000 MCG tablet Commonly known as:  CYANOCOBALAMIN Take 1,000 mcg by mouth daily.   Vitamin D3 50000 units Caps Take 1 capsule by mouth every 30 (thirty) days. TAKES ON THE 28TH OF EVERY MONTH       No orders of the defined types were placed in this encounter.   Immunization History  Administered Date(s) Administered  . Influenza,inj,Quad PF,36+ Mos 04/06/2016  . Influenza-Unspecified 05/06/2014  . PPD Test 03/18/2014, 04/07/2014    Social History  Substance Use Topics  . Smoking status: Never Smoker  . Smokeless tobacco: Never Used  . Alcohol use No    Family history is + HTN  Family History  Problem Relation Age of Onset  . Family history unknown: Yes      Review of Systems  DATA OBTAINED: from patient, duaghter - in-law. GENERAL:  no fevers,+ fatigue,+ appetite changes SKIN: No itching, or rash EYES: No eye pain, redness, discharge EARS: No earache, tinnitus, change in hearing NOSE: No congestion, drainage or bleeding  MOUTH/THROAT: No mouth or tooth pain, No sore throat RESPIRATORY: ++ cough, wheezing, SOB CARDIAC: No chest pain, palpitations, lower extremity edema  GI: No abdominal pain, No N/V/D or constipation, No heartburn or reflux  GU: No dysuria, frequency or urgency, or incontinence  MUSCULOSKELETAL: No unrelieved bone/joint pain NEUROLOGIC: No headache, dizziness or focal weakness PSYCHIATRIC: No c/o anxiety or sadness   Vitals:   August 22, 2015 0834  BP: (!) 156/91  Pulse: 69  Resp: (!) 22  Temp: 97.5 F (36.4 C)    SpO2 Readings from Last 1 Encounters:  August 22, 2015 100%        Physical Exam  GENERAL APPEARANCE: mildly somnalent, min conversant, looks pale and tired SKIN: No diaphoresis rash; pale HEAD: Normocephalic,  atraumatic  EYES: Conjunctiva/lids clear. Pupils round, reactive. EOMs intact.  EARS: External exam WNL, canals clear. Hearing grossly normal.  NOSE: No deformity or discharge.  MOUTH/THROAT: Lips w/o lesions  RESPIRATORY: Breathing is even, mild labored. Lung sounds are decreased, no rwheezes  CARDIOVASCULAR: Heart RRR no murmurs, rubs or gallops. 1+ peripheral edema.   GASTROINTESTINAL: Abdomen is soft, non-tender, not distended w/ normal bowel sounds. GENITOURINARY: Bladder non tender, not distended  MUSCULOSKELETAL: No abnormal joints or musculature NEUROLOGIC:  Cranial nerves 2-12 grossly intact. Moves all extremities  PSYCHIATRIC: Mood and affect appropriate to situation, pt able to smile a little, no behavioral issues  Patient Active Problem List   Diagnosis Date Noted  . Dyspnea   . Advance care planning   . Goals of care, counseling/discussion   . Palliative care by specialist   . HCAP (healthcare-associated pneumonia) 03/28/2016  . Hypertensive  urgency 03/28/2016  . Acute respiratory failure with hypoxia (HCC) 03/26/2016  . Osteoporosis 02/12/2016  . Hyperlipidemia 09/13/2015  . Pre-diabetes 08/02/2015  . Morbid obesity (HCC) 06/14/2015  . Benign essential HTN 06/14/2015  . Dysphagia 06/13/2015  . Depression 03/18/2015  . Hypothyroidism 02/23/2007  . Anemia, B12 deficiency 02/23/2007  . CKD (chronic kidney disease) stage 2, GFR 60-89 ml/min 02/23/2007       Component Value Date/Time   WBC 15.0 (H) 04/06/2016 0537   RBC 3.49 (L) 04/06/2016 0537   HGB 11.8 (L) 04/06/2016 0537   HCT 35.9 (L) 04/06/2016 0537   PLT 421 (H) 04/06/2016 0537   MCV 102.9 (H) 04/06/2016 0537   LYMPHSABS 0.7 03/28/2016 1546   MONOABS 1.8 (H) 03/28/2016 1546   EOSABS 0.3 03/28/2016 1546   BASOSABS 0.1 03/28/2016 1546        Component Value Date/Time   NA 130 (L) 04/06/2016 0829   NA 130 (A) 04/06/2016   K 4.0 04/06/2016 0829   CL 92 (L) 04/06/2016 0829   CO2 31 04/06/2016 0829    GLUCOSE 129 (H) 04/06/2016 0829   BUN 33 (H) 04/06/2016 0829   BUN 33 (A) 04/06/2016   CREATININE 0.99 04/06/2016 0829   CALCIUM 8.6 (L) 04/06/2016 0829   PROT 6.9 03/28/2016 1545   ALBUMIN 2.9 (L) 03/28/2016 1545   AST 25 03/28/2016 1545   ALT 25 03/28/2016 1545   ALKPHOS 87 03/28/2016 1545   BILITOT 0.7 03/28/2016 1545   GFRNONAA 49 (L) 04/06/2016 0829   GFRAA 56 (L) 04/06/2016 0829    Lab Results  Component Value Date   HGBA1C 6.0 09/04/2014    Lab Results  Component Value Date   CHOL 202 (A) 11/20/2015   HDL 43 11/20/2015   LDLCALC 132 11/20/2015   TRIG 137 11/20/2015     No results found.  Not all labs, radiology exams or other studies done during hospitalization come through on my EPIC note; however they are reviewed by me.    Assessment and Plan  ACUTE RESPIRATORY FAILURE/  HCAP/ B PLEURAL EFFUSIONS - Chest x-ray shows increasing left base collapse and consolidation with persistent bilateral pleural effusions  Unasyn stopped and  cefpodoxime started for 5 days on discharge  Continue as needed albuterol for shortness of breath or wheezing as well as every 4 hours scheduled Has gotten lasix 40 gm IV 9/11 and one dose on 9/12 40 mg IV; will give another lasix 40 gm IV today prior to discharge She will continue low-dose Lasix on discharged CT chest notable for mass-like, irregular and ill-defined subpleural opacity in the superior segment of the left lower lobe may represent a region of rounded/masslike pneumonia, or a true pulmonary mass. Pneumonia favored. SNF - Have increased lasix to 40 mg daily for 3 days, pt still with sig edema ; cont O2 , cont cefopime for 5 days; pt appears too ill to have d/c from hospital  HTN URGENCY- was treated with NTG drip, which didn't work and cardene drip which was stopped on 9/10 when coreg was started SNF - BP was elevated this am but after meds improved; cont coreg 25 mg BID, norvasc 5 mg daily, lasix 40 mg daily for 3 days then  check BMP, hysralazine 50 mg TID and avapre 300 mg daily  HYPOTHYROIDISM SNF -  CONTROLLED; cont synthroid 150 mcg daily  DEPRESSION- SNF - cont lexapro 20 mg daily  PBA SNF - cont neudexta20-10  BID   HYPONATREMIA-prob 2./2 pulmonary  process ;d/c Na+ 130 SNF - f/u BMP Monday      Time spent > 45 min;> 50% of time with patient was spent reviewing records, labs, tests and studies, counseling and developing plan of care  Merrilee Seashore, MD

## 2016-04-24 NOTE — ED Provider Notes (Signed)
[facility]-EMERGENCY DEPT Provider Note   CSN: [id number] Arrival date & time: 04/09/2016  1901     History   Chief Complaint Chief Complaint  Patient presents with  . Respiratory Distress    HPI Kristine [name] is a 82 y.o. female.  HPI   82 year old female with a history of hypertension, hyperlipidemia, CKD, hypothyroidism, recent admission to [facility] for concern of healthcare associated pneumonia and hypertensive urgency, and during that admission palliative care discussions were done and patient decided to be DNI, presents from skilled nursing facility for concern of unresponsiveness.  Initially, per facility/EMS, it was thought that patient was full code.  She was apneic with EMS, and intubation was attempted without medications 2, and she was assisted with BVM through the rest transport and given duonebs.  Patient discharged from [facility] at [facility] yesterday for HCAP.  Facility went to check on her, and found her unresponsive. Reports she was last seen awake at 3 PM. Daughter-in-law reports that she had lunch with her today, and patient reported that she was "tired."  Denies any known hx of trauma. History is limited by acuity of condition and patient unresponsive. No hx of narcotic use.  Glucose was WNL with EMS.   Past Medical History:  Diagnosis Date  . Bilateral leg edema   . Hyperlipidemia   . Hypertension     Patient Active Problem List   Diagnosis Date Noted  . Dyspnea   . Advance care planning   . Goals of care, counseling/discussion   . Palliative care by specialist   . HCAP (healthcare-associated pneumonia) 03/28/2016  . Hypertensive urgency 03/28/2016  . Acute respiratory failure with hypoxia (HCC) 03/26/2016  . Osteoporosis 02/12/2016  . Hyperlipidemia 09/13/2015  . Pre-diabetes 08/02/2015  . Morbid obesity (HCC) 06/14/2015  . Benign essential HTN 06/14/2015  . Dysphagia 06/13/2015  . Depression 03/18/2015  . Hypothyroidism 02/23/2007  . Anemia, B12 deficiency  02/23/2007  . CKD (chronic kidney disease) stage 2, GFR 60-89 ml/min 02/23/2007    Past Surgical History:  Procedure Laterality Date  . ANKLE FRACTURE SURGERY    . CHOLECYSTECTOMY      OB History    No data available       Home Medications    Prior to Admission medications   Medication Sig Start Date End Date Taking? Authorizing Provider  acetaminophen (TYLENOL) 325 MG tablet Take 650 mg by mouth every 8 (eight) hours. Not to exceed 3G    Historical Provider, MD  amLODipine (NORVASC) 5 MG tablet Take 1 tablet (5 mg total) by mouth daily. 04/06/16   Alison Murray, MD  aspirin 81 MG chewable tablet Chew 81 mg by mouth daily.    Historical Provider, MD  carboxymethylcellulose (REFRESH PLUS) 0.5 % SOLN Place 1 drop into both eyes 2 (two) times daily.     Historical Provider, MD  carvedilol (COREG) 25 MG tablet Take 25 mg by mouth 2 (two) times daily with a meal. For HTN , NOTIFY PROVIDER IF BP IS OVER 160 FOR TWO CONSECUTIVE READINGS    Historical Provider, MD  cefpodoxime (VANTIN) 200 MG tablet Take 1 tablet (200 mg total) by mouth 2 (two) times daily. 04/06/16   Alison Murray, MD  Cetylpyridinium Chloride (ANTISEPTIC ORAL RINSE MT) Use as directed 15 mLs in the mouth or throat 2 (two) times daily.    Historical Provider, MD  Cholecalciferol (VITAMIN D3) 50000 UNITS CAPS Take 1 capsule by mouth every 30 (thirty) days. TAKES ON THE  28TH OF EVERY MONTH    Historical Provider, MD  Dextromethorphan-Quinidine (NUEDEXTA) 20-10 MG CAPS Take 1 capsule by mouth 2 (two) times daily.    Historical Provider, MD  escitalopram (LEXAPRO) 20 MG tablet Take 20 mg by mouth daily.    Historical Provider, MD  furosemide (LASIX) 20 MG tablet Take 1 tablet (20 mg total) by mouth daily. 04/06/16   Alison Murray, MD  guaiFENesin (MUCINEX) 600 MG 12 hr tablet Take 1 tablet (600 mg total) by mouth 2 (two) times daily as needed. 04/06/16   Alison Murray, MD  hydrALAZINE (APRESOLINE) 50 MG tablet Take 1 tablet (50 mg  total) by mouth 3 (three) times daily. 04/06/16   Alison Murray, MD  ipratropium-albuterol (DUONEB) 0.5-2.5 (3) MG/3ML SOLN Take 3 mLs by nebulization every 8 (eight) hours as needed. For wheezing , congestion, and dyspnea    Historical Provider, MD  irbesartan (AVAPRO) 300 MG tablet Take 300 mg by mouth daily. For HTN    Historical Provider, MD  levothyroxine (SYNTHROID, LEVOTHROID) 150 MCG tablet Take 150 mcg by mouth daily before breakfast.    Historical Provider, MD  Nutritional Supplements (ENSURE PO) Take 237 mLs by mouth 3 (three) times daily between meals.    Historical Provider, MD  ondansetron (ZOFRAN) 4 MG tablet Take 4 mg by mouth every 6 (six) hours as needed for nausea or vomiting.    Historical Provider, MD  OXYGEN Inhale 2 L into the lungs continuous. To maintain 02 sat >90    Historical Provider, MD  vitamin B-12 (CYANOCOBALAMIN) 1000 MCG tablet Take 1,000 mcg by mouth daily.    Historical Provider, MD    Family History Family History  Problem Relation Age of Onset  . Family history unknown: Yes    Social History Social History  Substance Use Topics  . Smoking status: Never Smoker  . Smokeless tobacco: Never Used  . Alcohol use No     Allergies   Amitriptyline; Buspar [buspirone]; Codeine; Dyazide [hydrochlorothiazide w-triamterene]; and Macrolides and ketolides   Review of Systems Review of Systems  Unable to perform ROS: Mental status change     Physical Exam Updated Vital Signs BP (!) 90/50   Pulse 60   Resp 15   Ht 5\' 5"  (1.651 m)   Wt 231 lb (104.8 kg)   SpO2 100%   BMI 38.44 kg/m   Physical Exam  Constitutional: She appears well-developed and well-nourished. She appears toxic. She has a sickly appearance.  BVM  HENT:  Head: Normocephalic and atraumatic.  Brown secretions coming from mouth Nasal trumpet in place  Eyes: Right conjunctiva is not injected. Left conjunctiva is not injected.  Neck: Normal range of motion.  Cardiovascular: Normal  rate, regular rhythm, normal heart sounds and intact distal pulses.   Pulmonary/Chest: She is in respiratory distress. She has rhonchi.  Shallow respirations  Abdominal: Soft.  Musculoskeletal: She exhibits no edema or tenderness.  Neurological: She is unresponsive. GCS eye subscore is 1. GCS verbal subscore is 1. GCS motor subscore is 1.  Skin: Skin is warm and dry. No rash noted. She is not diaphoretic. No erythema. There is pallor.  Nursing note and vitals reviewed.  CRITICAL CARE: hypoxic respiratory failure requiring assisted ventilations (prior to determination of goals of care) Performed by: Rhae Lerner   Total critical care time: 30 minutes  Critical care time was exclusive of separately billable procedures and treating other patients.  Critical care was necessary to treat or  prevent imminent or life-threatening deterioration.  Critical care was time spent personally by me on the following activities: development of treatment plan with patient and/or surrogate as well as nursing, discussions with consultants, evaluation of patient's response to treatment, examination of patient, obtaining history from patient or surrogate, ordering and performing treatments and interventions, ordering and review of laboratory studies, ordering and review of radiographic studies, pulse oximetry and re-evaluation of patient's condition.   ED Treatments / Results  Labs (all labs ordered are listed, but only abnormal results are displayed) Labs Reviewed - No data to display  EKG  EKG Interpretation None       Radiology No results found.  Procedures Procedures (including critical care time)  Medications Ordered in ED Medications  phenylephrine 0.4-0.9 MG/10ML-% injection (not administered)  0.9 %  sodium chloride infusion ( Intravenous Stopped 07/16/2016 1930)  0.9 %  sodium chloride infusion ( Intravenous Stopped 07/16/2016 1930)     Initial Impression / Assessment and Plan  / ED Course  I have reviewed the triage vital signs and the nursing notes.  Pertinent labs & imaging results that were available during my care of the patient were reviewed by me and considered in my medical decision making (see chart for details).  Clinical Course   82 year old female with a history of hypertension, hyperlipidemia, CKD, hypothyroidism, recent admission [facility] for concern of healthcare associated pneumonia and hypertensive urgency, and during that admission palliative care discussions were done and patient decided to be DNI, presents from skilled nursing facility for concern of unresponsiveness.  Initially, per facility/EMS, it was thought that patient was full code.  She was apneic with EMS, and intubation was attempted without medications 2, and she was assisted with BVM through the rest transport.   On arrival to the emergency department, patient is a shallow respirations, and hypoxic to 50%. With bag valve mask in the emergency department, patient's oxygenation improved to 100% with BVM, and preparations for intubation were made.  Patient was hypotensive to 90s over 50s, and it initial plan was to optimize blood pressures and intubate patient.  Blood pressures improved, however, while preparing for intubation, daughter-in-law called, and reported that patient is DO NOT INTUBATE. Continued BVM as we had daughter come back to the room, and in the room we stopped bag-valve-mask, place patient on nonrebreather per patient's wishes. Patient's oxygen saturation slowly started to drop, and she went into asystole with time of death of 1930. Medical examiner agrees that she is not medical examiner case. Notified patient's PCP Dr. Lyn HollingsheadAlexander.     Final Clinical Impressions(s) / ED Diagnoses   Final diagnoses:  Acute respiratory failure with hypoxia (HCC)  Death  DNI (do not intubate)  Palliative care status    New Prescriptions Discharge Medication List as of 06-27-2016 11:24  PM       Alvira MondayErin Griffey Nicasio, MD 04/08/16 0205

## 2016-04-24 DEATH — deceased

## 2017-04-28 IMAGING — DX DG CHEST 1V PORT
1 series · 1 of 1 positions shown · non-contrast
Comparison: CT scan 03/30/2016.  Chest x-ray 03/28/2016.

CLINICAL DATA: Pneumonia and shortness of breath.

EXAM:
PORTABLE CHEST 1 VIEW

[chest ap]
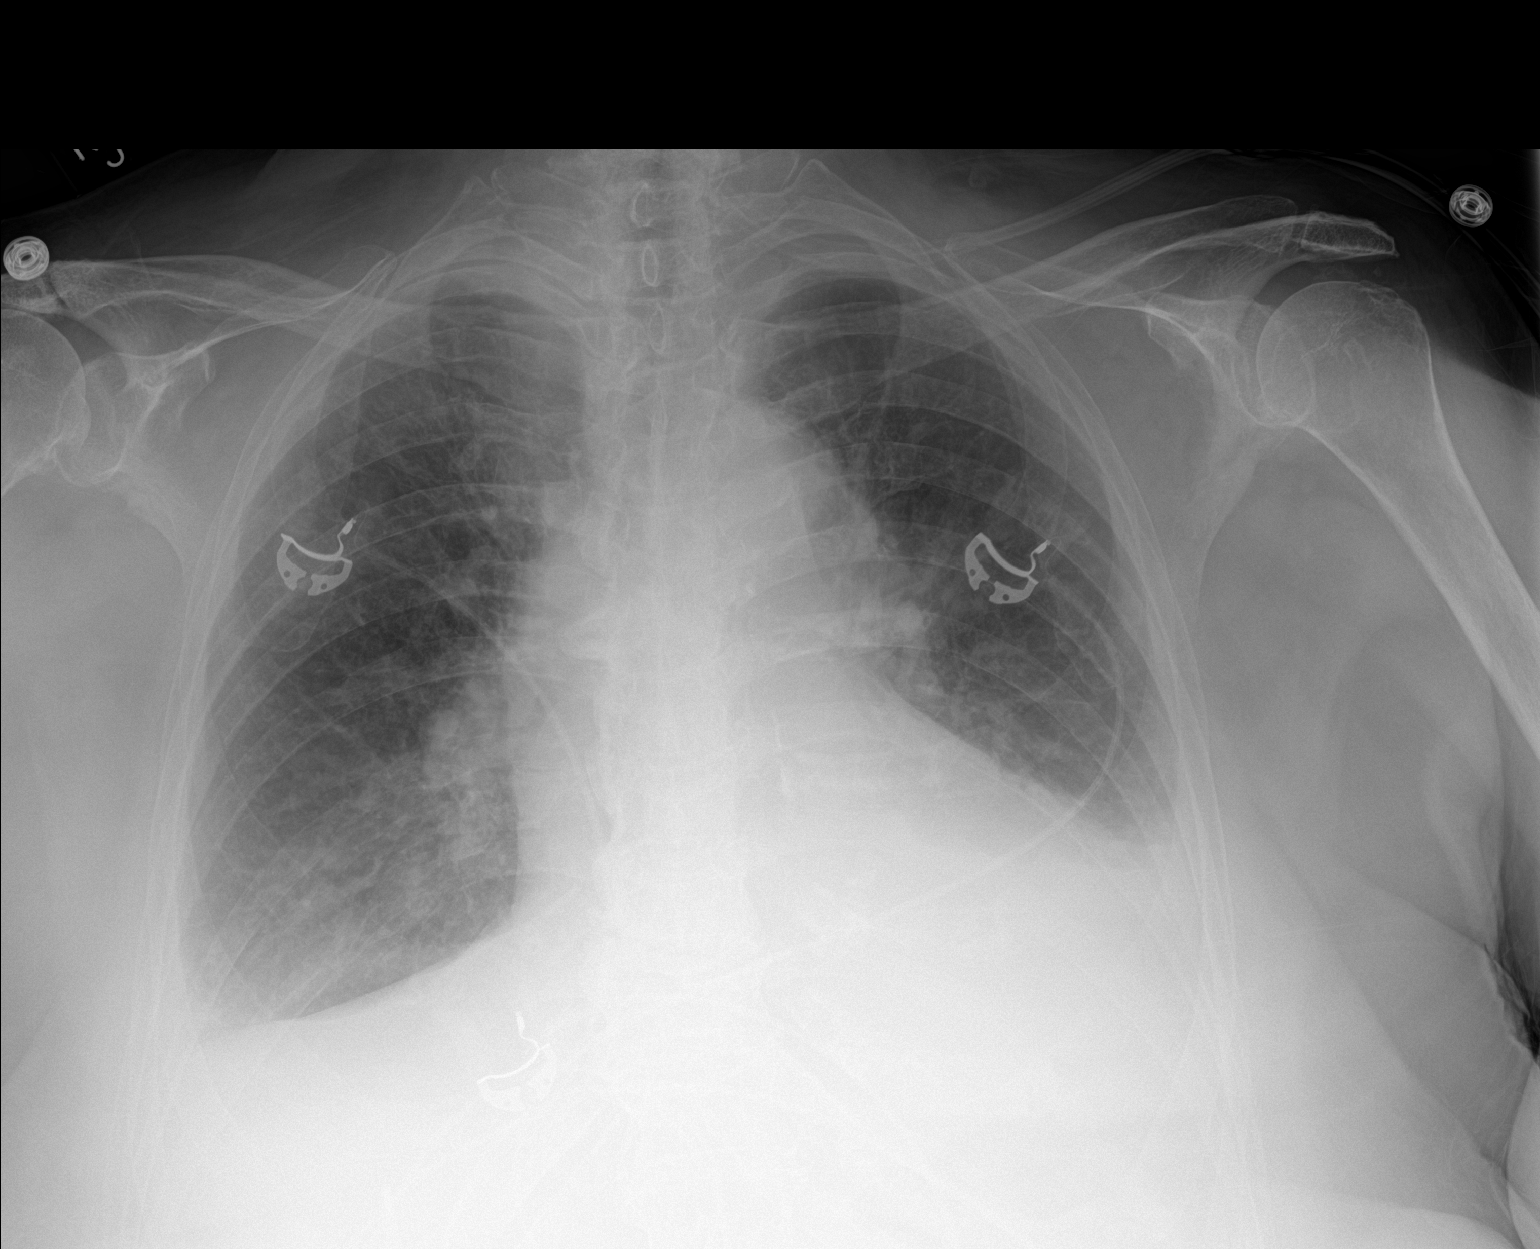

[1 of 1 positions shown; findings below may reference images not displayed]

FINDINGS: 4506 hours. Increasing retrocardiac collapse/ consolidation with
progressive left pleural effusion. Tiny right pleural effusion again
noted. Patchy airspace disease right upper lobe has improved in the
interval. The cardio pericardial silhouette is enlarged. The
visualized bony structures of the thorax are intact. Telemetry leads
overlie the chest.
IMPRESSION: Increasing left base collapse/consolidation with persistent
bilateral pleural effusions.
